# Patient Record
Sex: Female | Born: 1935
Health system: Southern US, Community
[De-identification: ages and names within clinical notes are randomized; demographics above are authoritative.]

## PROBLEM LIST (undated history)

## (undated) DIAGNOSIS — M1990 Unspecified osteoarthritis, unspecified site: Secondary | ICD-10-CM

## (undated) DIAGNOSIS — E559 Vitamin D deficiency, unspecified: Secondary | ICD-10-CM

## (undated) DIAGNOSIS — D649 Anemia, unspecified: Secondary | ICD-10-CM

## (undated) DIAGNOSIS — N200 Calculus of kidney: Secondary | ICD-10-CM

## (undated) DIAGNOSIS — J45909 Unspecified asthma, uncomplicated: Secondary | ICD-10-CM

## (undated) DIAGNOSIS — M810 Age-related osteoporosis without current pathological fracture: Secondary | ICD-10-CM

## (undated) DIAGNOSIS — K859 Acute pancreatitis without necrosis or infection, unspecified: Secondary | ICD-10-CM

## (undated) DIAGNOSIS — K219 Gastro-esophageal reflux disease without esophagitis: Secondary | ICD-10-CM

## (undated) DIAGNOSIS — I10 Essential (primary) hypertension: Secondary | ICD-10-CM

## (undated) HISTORY — DX: Unspecified osteoarthritis, unspecified site: M19.90

## (undated) HISTORY — PX: ABDOMINAL HYSTERECTOMY: SHX81

## (undated) HISTORY — DX: Acute pancreatitis without necrosis or infection, unspecified: K85.90

## (undated) HISTORY — DX: Vitamin D deficiency, unspecified: E55.9

## (undated) HISTORY — DX: Unspecified asthma, uncomplicated: J45.909

## (undated) HISTORY — DX: Gastro-esophageal reflux disease without esophagitis: K21.9

## (undated) HISTORY — DX: Essential (primary) hypertension: I10

## (undated) HISTORY — DX: Anemia, unspecified: D64.9

## (undated) HISTORY — DX: Calculus of kidney: N20.0

## (undated) HISTORY — PX: LITHOTRIPSY: SUR834

## (undated) HISTORY — PX: REPLACEMENT TOTAL KNEE: SUR1224

## (undated) HISTORY — DX: Age-related osteoporosis without current pathological fracture: M81.0

---

## 2004-12-08 ENCOUNTER — Ambulatory Visit: Payer: Self-pay | Admitting: Occupational Therapy

## 2005-01-25 ENCOUNTER — Ambulatory Visit: Payer: Self-pay | Admitting: Occupational Therapy

## 2005-06-22 ENCOUNTER — Ambulatory Visit: Payer: Self-pay | Admitting: Internal Medicine

## 2005-10-11 ENCOUNTER — Ambulatory Visit: Payer: Self-pay | Admitting: Surgery

## 2006-04-12 ENCOUNTER — Ambulatory Visit: Payer: Self-pay | Admitting: Surgery

## 2006-04-16 ENCOUNTER — Ambulatory Visit: Payer: Self-pay | Admitting: Internal Medicine

## 2006-06-05 ENCOUNTER — Ambulatory Visit: Payer: Self-pay | Admitting: Internal Medicine

## 2006-06-15 ENCOUNTER — Ambulatory Visit: Payer: Self-pay | Admitting: Internal Medicine

## 2006-07-27 ENCOUNTER — Emergency Department: Payer: Self-pay | Admitting: Emergency Medicine

## 2006-07-27 ENCOUNTER — Other Ambulatory Visit: Payer: Self-pay

## 2006-08-15 ENCOUNTER — Ambulatory Visit: Payer: Self-pay | Admitting: Internal Medicine

## 2007-01-17 ENCOUNTER — Other Ambulatory Visit: Payer: Self-pay

## 2007-01-17 ENCOUNTER — Inpatient Hospital Stay: Payer: Self-pay | Admitting: Vascular Surgery

## 2007-05-12 ENCOUNTER — Emergency Department: Payer: Self-pay | Admitting: Emergency Medicine

## 2007-10-15 ENCOUNTER — Ambulatory Visit: Payer: Self-pay | Admitting: Internal Medicine

## 2007-12-14 ENCOUNTER — Emergency Department: Payer: Self-pay | Admitting: Emergency Medicine

## 2007-12-14 ENCOUNTER — Other Ambulatory Visit: Payer: Self-pay

## 2008-05-09 ENCOUNTER — Other Ambulatory Visit: Payer: Self-pay

## 2008-05-10 ENCOUNTER — Inpatient Hospital Stay: Payer: Self-pay | Admitting: Surgery

## 2008-05-19 ENCOUNTER — Other Ambulatory Visit: Payer: Self-pay

## 2008-05-20 ENCOUNTER — Inpatient Hospital Stay: Payer: Self-pay | Admitting: Internal Medicine

## 2008-06-16 ENCOUNTER — Encounter: Payer: Self-pay | Admitting: Rheumatology

## 2008-09-25 ENCOUNTER — Ambulatory Visit: Payer: Self-pay | Admitting: Rheumatology

## 2008-11-12 ENCOUNTER — Ambulatory Visit: Payer: Self-pay | Admitting: Internal Medicine

## 2009-03-11 ENCOUNTER — Ambulatory Visit: Payer: Self-pay | Admitting: Internal Medicine

## 2010-02-17 ENCOUNTER — Ambulatory Visit: Payer: Self-pay | Admitting: Internal Medicine

## 2010-10-27 ENCOUNTER — Ambulatory Visit: Payer: Self-pay | Admitting: Otolaryngology

## 2010-10-27 ENCOUNTER — Ambulatory Visit: Payer: Self-pay

## 2011-01-12 ENCOUNTER — Ambulatory Visit: Payer: Self-pay | Admitting: Otolaryngology

## 2011-01-14 ENCOUNTER — Emergency Department: Payer: Self-pay | Admitting: Emergency Medicine

## 2011-01-21 ENCOUNTER — Emergency Department: Payer: Self-pay | Admitting: Emergency Medicine

## 2011-03-14 ENCOUNTER — Ambulatory Visit: Payer: Self-pay | Admitting: Internal Medicine

## 2011-05-05 ENCOUNTER — Observation Stay: Payer: Self-pay | Admitting: Internal Medicine

## 2011-07-19 ENCOUNTER — Ambulatory Visit: Payer: Self-pay | Admitting: Internal Medicine

## 2011-09-25 DIAGNOSIS — R05 Cough: Secondary | ICD-10-CM | POA: Diagnosis not present

## 2011-09-25 DIAGNOSIS — R0602 Shortness of breath: Secondary | ICD-10-CM | POA: Diagnosis not present

## 2011-10-04 ENCOUNTER — Ambulatory Visit: Payer: Self-pay | Admitting: Internal Medicine

## 2011-10-04 DIAGNOSIS — R1310 Dysphagia, unspecified: Secondary | ICD-10-CM | POA: Diagnosis not present

## 2011-11-06 DIAGNOSIS — R05 Cough: Secondary | ICD-10-CM | POA: Diagnosis not present

## 2011-11-06 DIAGNOSIS — R0602 Shortness of breath: Secondary | ICD-10-CM | POA: Diagnosis not present

## 2011-11-15 DIAGNOSIS — J209 Acute bronchitis, unspecified: Secondary | ICD-10-CM | POA: Diagnosis not present

## 2011-11-15 DIAGNOSIS — R05 Cough: Secondary | ICD-10-CM | POA: Diagnosis not present

## 2011-11-16 ENCOUNTER — Ambulatory Visit: Payer: Self-pay | Admitting: Internal Medicine

## 2011-11-16 DIAGNOSIS — R1310 Dysphagia, unspecified: Secondary | ICD-10-CM | POA: Diagnosis not present

## 2011-11-19 DIAGNOSIS — J209 Acute bronchitis, unspecified: Secondary | ICD-10-CM | POA: Diagnosis not present

## 2011-11-19 DIAGNOSIS — J019 Acute sinusitis, unspecified: Secondary | ICD-10-CM | POA: Diagnosis not present

## 2011-11-23 DIAGNOSIS — J069 Acute upper respiratory infection, unspecified: Secondary | ICD-10-CM | POA: Diagnosis not present

## 2011-11-23 DIAGNOSIS — R05 Cough: Secondary | ICD-10-CM | POA: Diagnosis not present

## 2011-11-23 DIAGNOSIS — R0989 Other specified symptoms and signs involving the circulatory and respiratory systems: Secondary | ICD-10-CM | POA: Diagnosis not present

## 2011-11-23 DIAGNOSIS — I10 Essential (primary) hypertension: Secondary | ICD-10-CM | POA: Diagnosis not present

## 2011-11-23 DIAGNOSIS — J40 Bronchitis, not specified as acute or chronic: Secondary | ICD-10-CM | POA: Diagnosis not present

## 2011-12-04 DIAGNOSIS — I369 Nonrheumatic tricuspid valve disorder, unspecified: Secondary | ICD-10-CM | POA: Diagnosis not present

## 2011-12-04 DIAGNOSIS — I059 Rheumatic mitral valve disease, unspecified: Secondary | ICD-10-CM | POA: Diagnosis not present

## 2011-12-04 DIAGNOSIS — I10 Essential (primary) hypertension: Secondary | ICD-10-CM | POA: Diagnosis not present

## 2011-12-06 DIAGNOSIS — M79609 Pain in unspecified limb: Secondary | ICD-10-CM | POA: Diagnosis not present

## 2011-12-06 DIAGNOSIS — I10 Essential (primary) hypertension: Secondary | ICD-10-CM | POA: Diagnosis not present

## 2011-12-06 DIAGNOSIS — D649 Anemia, unspecified: Secondary | ICD-10-CM | POA: Diagnosis not present

## 2012-01-05 DIAGNOSIS — M25519 Pain in unspecified shoulder: Secondary | ICD-10-CM | POA: Diagnosis not present

## 2012-01-05 DIAGNOSIS — M810 Age-related osteoporosis without current pathological fracture: Secondary | ICD-10-CM | POA: Diagnosis not present

## 2012-01-05 DIAGNOSIS — M159 Polyosteoarthritis, unspecified: Secondary | ICD-10-CM | POA: Diagnosis not present

## 2012-01-23 DIAGNOSIS — M810 Age-related osteoporosis without current pathological fracture: Secondary | ICD-10-CM | POA: Diagnosis not present

## 2012-01-23 DIAGNOSIS — I10 Essential (primary) hypertension: Secondary | ICD-10-CM | POA: Diagnosis not present

## 2012-01-29 DIAGNOSIS — R05 Cough: Secondary | ICD-10-CM | POA: Diagnosis not present

## 2012-01-29 DIAGNOSIS — R0602 Shortness of breath: Secondary | ICD-10-CM | POA: Diagnosis not present

## 2012-01-29 DIAGNOSIS — M810 Age-related osteoporosis without current pathological fracture: Secondary | ICD-10-CM | POA: Diagnosis not present

## 2012-01-29 DIAGNOSIS — M25519 Pain in unspecified shoulder: Secondary | ICD-10-CM | POA: Diagnosis not present

## 2012-03-13 DIAGNOSIS — M810 Age-related osteoporosis without current pathological fracture: Secondary | ICD-10-CM | POA: Diagnosis not present

## 2012-05-27 DIAGNOSIS — J209 Acute bronchitis, unspecified: Secondary | ICD-10-CM | POA: Diagnosis not present

## 2012-06-13 ENCOUNTER — Telehealth: Payer: Self-pay | Admitting: Internal Medicine

## 2012-06-13 NOTE — Telephone Encounter (Signed)
Patient's daughter advised as instructed via telephone. 

## 2012-06-13 NOTE — Telephone Encounter (Signed)
Best Call Back number is [phone] for the Daughter Liv

## 2012-06-13 NOTE — Telephone Encounter (Signed)
If not eating and weak, etc - needs eval before 10/17.  The acute care is open until 7:00 - see if daughter can take after work - then will follow up here

## 2012-06-13 NOTE — Telephone Encounter (Signed)
Pt's daughter is calling concerning her mother. She has a bad cold and is not eating at all, she is very weak and her daughter is very concerned about her. Pt;s daughter says she is off on October 14th and wanted to know if she could bring her in that day.

## 2012-06-20 DIAGNOSIS — Z23 Encounter for immunization: Secondary | ICD-10-CM | POA: Diagnosis not present

## 2012-07-01 DIAGNOSIS — I10 Essential (primary) hypertension: Secondary | ICD-10-CM | POA: Diagnosis not present

## 2012-07-01 DIAGNOSIS — I059 Rheumatic mitral valve disease, unspecified: Secondary | ICD-10-CM | POA: Diagnosis not present

## 2012-07-01 DIAGNOSIS — R0609 Other forms of dyspnea: Secondary | ICD-10-CM | POA: Diagnosis not present

## 2012-07-01 DIAGNOSIS — R0989 Other specified symptoms and signs involving the circulatory and respiratory systems: Secondary | ICD-10-CM | POA: Diagnosis not present

## 2012-07-04 ENCOUNTER — Telehealth: Payer: Self-pay | Admitting: Internal Medicine

## 2012-07-04 NOTE — Telephone Encounter (Signed)
If having dizziness and head hurting - would rec eval now to confirm nothing acute going on.  To acute care for eval - then i can follow up afterwards.

## 2012-07-04 NOTE — Telephone Encounter (Signed)
Lib called to see if ms Fregeau can be see sooner She stated ms Lucchesi is having light dizzy spells on and off and her head hurts for a year or more.  She has seen dr Lorin Picket about @ [facility]   And pt also needs order for mammogram  [place]

## 2012-07-04 NOTE — Telephone Encounter (Signed)
Called patient at home to inquired if she was still experiencing dizziness and head pain. Patient stated that she was not at the moment.

## 2012-07-29 DIAGNOSIS — J189 Pneumonia, unspecified organism: Secondary | ICD-10-CM | POA: Diagnosis not present

## 2012-07-29 DIAGNOSIS — R05 Cough: Secondary | ICD-10-CM | POA: Diagnosis not present

## 2012-07-30 DIAGNOSIS — D2370 Other benign neoplasm of skin of unspecified lower limb, including hip: Secondary | ICD-10-CM | POA: Diagnosis not present

## 2012-07-30 DIAGNOSIS — M79609 Pain in unspecified limb: Secondary | ICD-10-CM | POA: Diagnosis not present

## 2012-08-02 ENCOUNTER — Telehealth: Payer: Self-pay | Admitting: Internal Medicine

## 2012-08-02 NOTE — Telephone Encounter (Signed)
Amlodipine 5mg  90 tab Take 1 tablet daily [facility]

## 2012-08-02 NOTE — Telephone Encounter (Signed)
appt 08/16/13

## 2012-08-06 NOTE — Telephone Encounter (Signed)
Pt requesting amlodipine 5mg   New patient appointment 08/26/13 Please advise.

## 2012-08-06 NOTE — Telephone Encounter (Signed)
Ok to refill #90 with no refills.  

## 2012-08-07 ENCOUNTER — Other Ambulatory Visit: Payer: Self-pay | Admitting: *Deleted

## 2012-08-07 MED ORDER — AMLODIPINE BESYLATE 5 MG PO TABS: 5.0000 mg | ORAL_TABLET | Freq: Every day | ORAL | Status: DC

## 2012-08-07 NOTE — Telephone Encounter (Signed)
Refilled script

## 2012-08-16 ENCOUNTER — Encounter: Payer: Self-pay | Admitting: Internal Medicine

## 2012-08-16 ENCOUNTER — Ambulatory Visit (INDEPENDENT_AMBULATORY_CARE_PROVIDER_SITE_OTHER): Payer: Medicare Other | Admitting: Internal Medicine

## 2012-08-16 VITALS — BP 148/72 | HR 69 | Temp 97.9°F | Ht 60.0 in | Wt 111.8 lb

## 2012-08-16 DIAGNOSIS — M810 Age-related osteoporosis without current pathological fracture: Secondary | ICD-10-CM | POA: Insufficient documentation

## 2012-08-16 DIAGNOSIS — D649 Anemia, unspecified: Secondary | ICD-10-CM | POA: Insufficient documentation

## 2012-08-16 DIAGNOSIS — I10 Essential (primary) hypertension: Secondary | ICD-10-CM

## 2012-08-16 DIAGNOSIS — M1990 Unspecified osteoarthritis, unspecified site: Secondary | ICD-10-CM | POA: Insufficient documentation

## 2012-08-16 DIAGNOSIS — K219 Gastro-esophageal reflux disease without esophagitis: Secondary | ICD-10-CM

## 2012-08-16 DIAGNOSIS — M064 Inflammatory polyarthropathy: Secondary | ICD-10-CM | POA: Diagnosis not present

## 2012-08-16 LAB — CBC WITH DIFFERENTIAL/PLATELET
Basophils Absolute: 0 10*3/uL (ref 0.0–0.1)
Basophils Relative: 0.8 % (ref 0.0–3.0)
Eosinophils Absolute: 0 10*3/uL (ref 0.0–0.7)
Hemoglobin: 11.7 g/dL — ABNORMAL LOW (ref 12.0–15.0)
Lymphocytes Relative: 28.8 % (ref 12.0–46.0)
MCHC: 32.5 g/dL (ref 30.0–36.0)
MCV: 96.5 fl (ref 78.0–100.0)
Monocytes Absolute: 0.6 10*3/uL (ref 0.1–1.0)
Neutro Abs: 3.7 10*3/uL (ref 1.4–7.7)
Neutrophils Relative %: 61 % (ref 43.0–77.0)
RBC: 3.72 Mil/uL — ABNORMAL LOW (ref 3.87–5.11)
RDW: 12.7 % (ref 11.5–14.6)

## 2012-08-16 LAB — BASIC METABOLIC PANEL
BUN: 21 mg/dL (ref 6–23)
Chloride: 102 mEq/L (ref 96–112)
Glucose, Bld: 97 mg/dL (ref 70–99)
Potassium: 4.2 mEq/L (ref 3.5–5.1)
Sodium: 139 mEq/L (ref 135–145)

## 2012-08-16 NOTE — Assessment & Plan Note (Signed)
Saw GI.  Recommended if hgb stable - no further w/up.  Follow cbc.  

## 2012-08-16 NOTE — Patient Instructions (Addendum)
It was nice seeing you today.   I am glad you have been doing ok.  Let me know if you need anything.

## 2012-08-16 NOTE — Progress Notes (Signed)
  Subjective:    Patient ID: Heidi [name], female    DOB: 02-18-1918, 78 y.o.   MRN: [id number]  HPI 78 year old female with past history of nephrolithiasis, hypertension, GERD, osteoarthritis and osteoporosis who comes in today for a scheduled follow up.  States she is doing well.    Past Medical History  Diagnosis Date  . Anemia   . Hypertension   . Osteoporosis     vitamin D deficiency, nasal miacalcin, reclast, previous rib fracture  . Asthma   . GERD (gastroesophageal reflux disease)   . Nephrolithiasis   . OA (osteoarthritis)     Left knee replacement, hands, shoulders, cervial spine  . Vitamin D deficiency   . Inflammatory arthritis     elevated ESR, negative temporal artery bx, low titer rheumatoid factor  . Pancreatitis     s/p cholecystectomy with ERCP and stone extraction    Review of Systems Patient denies any headache, lightheadedness or dizziness.  No increased sinus or allergy symptoms.  No chest pain, tightness or palpitations.  No increased shortness of breath, cough or congestion.  No nausea or vomiting.  No acid reflux, dysphagia or odynophagia.  Acid controlled on Omeprazole.  No abdominal pain or cramping.  No bowel change, such as diarrhea, constipation, BRBPR or melana.  No urine change.        Objective:   Physical Exam Filed Vitals:   08/16/12 1034  BP: 148/72  Pulse: 69  Temp: 97.9 F (70.35 C)   78 year old female in no acute distress.   HEENT:  Nares - clear.  OP- without lesions or erythema.  NECK:  Supple, nontender.  No audible bruit.   HEART:  Appears to be regular. LUNGS:  Without crackles or wheezing audible.  Respirations even and unlabored.   RADIAL PULSE:  Equal bilaterally.  ABDOMEN:  Soft, nontender.  No audible abdominal bruit.   EXTREMITIES:  No increased edema to be present.  Stable.  Support hose in place.                   Assessment & Plan:  LOWER EXTREMITY EDEMA. Probably related to venostasis.  Continue support hose.   Follow.    HEALTH MAINTENANCE.  Obtain outside records for review.  Got her flu shot.

## 2012-08-16 NOTE — Assessment & Plan Note (Signed)
On Omeprazole.  Symptoms controlled.    

## 2012-08-16 NOTE — Assessment & Plan Note (Signed)
Received Reclast.  Continue calcium and vitamin D.  Follow.

## 2012-08-18 ENCOUNTER — Encounter: Payer: Self-pay | Admitting: Internal Medicine

## 2012-08-18 NOTE — Assessment & Plan Note (Signed)
Blood pressure as outlined.  States outside checks under control.  Same medication.  Check metabolic panel.

## 2012-08-18 NOTE — Assessment & Plan Note (Signed)
Stable.  Has seen Dr [facility].  Persistent shoulder pain and limited rom.  Desires no further intervention at this point.  Follow.    

## 2012-08-18 NOTE — Assessment & Plan Note (Signed)
Stable.  Follow.   

## 2012-08-19 ENCOUNTER — Encounter: Payer: Self-pay | Admitting: *Deleted

## 2012-08-20 DIAGNOSIS — D2370 Other benign neoplasm of skin of unspecified lower limb, including hip: Secondary | ICD-10-CM | POA: Diagnosis not present

## 2012-08-20 DIAGNOSIS — M79609 Pain in unspecified limb: Secondary | ICD-10-CM | POA: Diagnosis not present

## 2012-08-22 ENCOUNTER — Telehealth: Payer: Self-pay | Admitting: *Deleted

## 2012-08-22 NOTE — Telephone Encounter (Signed)
Message copied by Marlene Lard on Thu Aug 22, 2012  4:54 PM ------      Message from: Charm Barges      Created: Sat Aug 17, 2012  5:15 PM       Notify pt hemoglobin stable.  Ok.  Other labs ok.

## 2012-08-22 NOTE — Telephone Encounter (Signed)
Called patient with results.  

## 2012-09-03 ENCOUNTER — Encounter: Payer: Self-pay | Admitting: Internal Medicine

## 2012-10-01 DIAGNOSIS — H353190 Nonexudative age-related macular degeneration, unspecified eye, stage unspecified: Secondary | ICD-10-CM | POA: Diagnosis not present

## 2012-10-15 ENCOUNTER — Other Ambulatory Visit: Payer: Self-pay | Admitting: General Practice

## 2012-10-15 MED ORDER — OMEPRAZOLE 20 MG PO CPDR: 20.0000 mg | DELAYED_RELEASE_CAPSULE | Freq: Every day | ORAL | Status: DC

## 2012-11-01 ENCOUNTER — Emergency Department: Payer: Self-pay | Admitting: Emergency Medicine

## 2012-11-01 DIAGNOSIS — J310 Chronic rhinitis: Secondary | ICD-10-CM | POA: Diagnosis not present

## 2012-11-01 DIAGNOSIS — Z9089 Acquired absence of other organs: Secondary | ICD-10-CM | POA: Diagnosis not present

## 2012-11-01 DIAGNOSIS — R059 Cough, unspecified: Secondary | ICD-10-CM | POA: Diagnosis not present

## 2012-11-01 DIAGNOSIS — Z79899 Other long term (current) drug therapy: Secondary | ICD-10-CM | POA: Diagnosis not present

## 2012-11-01 DIAGNOSIS — R079 Chest pain, unspecified: Secondary | ICD-10-CM | POA: Diagnosis not present

## 2012-11-01 DIAGNOSIS — I10 Essential (primary) hypertension: Secondary | ICD-10-CM | POA: Diagnosis not present

## 2012-11-01 DIAGNOSIS — Z888 Allergy status to other drugs, medicaments and biological substances status: Secondary | ICD-10-CM | POA: Diagnosis not present

## 2012-11-01 DIAGNOSIS — Z9079 Acquired absence of other genital organ(s): Secondary | ICD-10-CM | POA: Diagnosis not present

## 2012-11-01 DIAGNOSIS — K219 Gastro-esophageal reflux disease without esophagitis: Secondary | ICD-10-CM | POA: Diagnosis not present

## 2012-11-01 DIAGNOSIS — Z882 Allergy status to sulfonamides status: Secondary | ICD-10-CM | POA: Diagnosis not present

## 2012-11-01 DIAGNOSIS — Z7982 Long term (current) use of aspirin: Secondary | ICD-10-CM | POA: Diagnosis not present

## 2012-11-08 ENCOUNTER — Ambulatory Visit (INDEPENDENT_AMBULATORY_CARE_PROVIDER_SITE_OTHER): Payer: Medicare Other | Admitting: Internal Medicine

## 2012-11-08 ENCOUNTER — Encounter: Payer: Self-pay | Admitting: Internal Medicine

## 2012-11-08 VITALS — BP 158/74 | HR 70 | Temp 97.9°F | Ht 60.0 in | Wt 114.0 lb

## 2012-11-08 DIAGNOSIS — D649 Anemia, unspecified: Secondary | ICD-10-CM

## 2012-11-08 DIAGNOSIS — K219 Gastro-esophageal reflux disease without esophagitis: Secondary | ICD-10-CM

## 2012-11-08 MED ORDER — FLUTICASONE PROPIONATE 50 MCG/ACT NA SUSP: 2.0000 | Freq: Every day | NASAL | Status: DC

## 2012-11-10 ENCOUNTER — Encounter: Payer: Self-pay | Admitting: Internal Medicine

## 2012-11-10 NOTE — Assessment & Plan Note (Signed)
Saw GI.  Recommended if hgb stable - no further w/up.  Follow cbc.  She is unclear if any labs were drawn in the ER.  Obtain lab results if drawn.

## 2012-11-10 NOTE — Assessment & Plan Note (Signed)
Blood pressure as outlined.  States outside checks under control.  Same medication.  Follow metabolic panel.

## 2012-11-10 NOTE — Progress Notes (Signed)
Subjective:    Patient ID: Heidi [name], female    DOB: 01-21-1918, 79 y.o.   MRN: [id number]  HPI 79 year old female with past history of nephrolithiasis, hypertension, GERD, osteoarthritis and osteoporosis who comes in today for an ER follow up.  She was seen 11/01/12 for persistent cough.  States she has had problems with persistent cough, but reports that on this day she had an episode of continuing cough and apparently it was hard to catch her breath.  Symptoms had resolved in the ER.  CXR did not reveal any acute abnormality.  She was discharged to follow up here.  She states she has had a problem with cough for years.  No real change per her report.  No increased sob or chest tightness.  Unsure of the medicines she is currently using/taking.  She has been worked up previously.  Has been treated for allergies and acid reflux.  Has seen pulmonary.  Nothing has seemed to make a difference (in the past) with the cough.  States she is eating and drinking well.  No nausea or vomiting.  No bowel change.   Past Medical History  Diagnosis Date  . Anemia   . Hypertension   . Osteoporosis     vitamin D deficiency, nasal miacalcin, reclast, previous rib fracture  . Asthma   . GERD (gastroesophageal reflux disease)   . Nephrolithiasis   . OA (osteoarthritis)     Left knee replacement, hands, shoulders, cervial spine  . Vitamin D deficiency   . Inflammatory arthritis     elevated ESR, negative temporal artery bx, low titer rheumatoid factor  . Pancreatitis     s/p cholecystectomy with ERCP and stone extraction    Review of Systems Patient denies any headache, lightheadedness or dizziness.  No increased sinus or allergy symptoms. Some minimal drainage.  No chest pain, tightness or palpitations.  No increased shortness of breath.  Does report some increased cough.  See above.  No nausea or vomiting.  No acid reflux, dysphagia or odynophagia.  Acid controlled on Omeprazole.  No abdominal pain  or cramping.  No bowel change, such as diarrhea, constipation, BRBPR or melana.  No urine change.        Objective:   Physical Exam  Filed Vitals:   11/08/12 1347  BP: 158/74  Pulse: 70  Temp: 97.9 F (36.6 C)   Blood pressure recheck:  148-150/78, pulse 21  79 year old female in no acute distress.   HEENT:  Nares - clear.  OP- without lesions or erythema.  TMs visualized - without erythema.  NECK:  Supple, nontender.  No audible bruit.   HEART:  Appears to be regular. LUNGS:  Without crackles or wheezing audible.  Respirations even and unlabored.  No increased cough with forced expiration.  RADIAL PULSE:  Equal bilaterally.  ABDOMEN:  Soft, nontender.  No audible abdominal bruit.   EXTREMITIES:  No increased edema to be present.  Stable.   SKIN:  No rash.                  Assessment & Plan:  COUGH.  Probably some allergy triggers.  She is unclear of the exact medications she is currently taking.  She feels her symptoms are stable.  Will start her on Flonase.  She will call with her current medications.  Adjust accordingly.  Was previously on singulair.  Confirm if using any inhalers.  Robitussin DM for increased cough.  Continue omeprazole for  continued control of acid reflux.     HEALTH MAINTENANCE.  Obtain outside records for review.  Got her flu shot.

## 2012-11-10 NOTE — Assessment & Plan Note (Signed)
Stable.  Has seen Dr Gavin Potters.  Persistent shoulder pain and limited rom.  Desires no further intervention at this point.  Follow.

## 2012-11-10 NOTE — Assessment & Plan Note (Signed)
On Omeprazole.  Symptoms controlled.    

## 2012-11-10 NOTE — Assessment & Plan Note (Signed)
Stable.  Follow.   

## 2012-11-28 ENCOUNTER — Telehealth: Payer: Self-pay | Admitting: Internal Medicine

## 2012-11-28 NOTE — Telephone Encounter (Signed)
[phone] cell Lanora Manis daughter.  Asking if Dr. Lorin Picket can call in an Rx to [facility] for prednisone for her shoulders.  Please advise.

## 2012-11-28 NOTE — Telephone Encounter (Signed)
Patient will need referral to see Lavenia Atlas. Per Dr. Lorin Picket. If patient is agreeable to referral. Patient may need inj and will need to get prednisone for wally.

## 2012-11-28 NOTE — Telephone Encounter (Signed)
Called patient's daughter to get more info about her mother's sx.

## 2012-11-29 NOTE — Telephone Encounter (Signed)
Pt daughter returned call.  States pt is already a pt of Dr. Lavenia Atlas and she is going to go ahead and call and make an appointment for her mother with Dr. Gavin Potters.

## 2012-12-02 NOTE — Telephone Encounter (Signed)
Just an FYI

## 2012-12-12 DIAGNOSIS — B351 Tinea unguium: Secondary | ICD-10-CM | POA: Diagnosis not present

## 2012-12-12 DIAGNOSIS — M79609 Pain in unspecified limb: Secondary | ICD-10-CM | POA: Diagnosis not present

## 2012-12-12 DIAGNOSIS — M7750 Other enthesopathy of unspecified foot: Secondary | ICD-10-CM | POA: Diagnosis not present

## 2012-12-12 DIAGNOSIS — D2370 Other benign neoplasm of skin of unspecified lower limb, including hip: Secondary | ICD-10-CM | POA: Diagnosis not present

## 2012-12-16 ENCOUNTER — Encounter: Payer: Medicare Other | Admitting: Internal Medicine

## 2012-12-19 DIAGNOSIS — M25519 Pain in unspecified shoulder: Secondary | ICD-10-CM | POA: Diagnosis not present

## 2012-12-19 DIAGNOSIS — M19019 Primary osteoarthritis, unspecified shoulder: Secondary | ICD-10-CM | POA: Diagnosis not present

## 2012-12-26 DIAGNOSIS — M79609 Pain in unspecified limb: Secondary | ICD-10-CM | POA: Diagnosis not present

## 2012-12-26 DIAGNOSIS — D2370 Other benign neoplasm of skin of unspecified lower limb, including hip: Secondary | ICD-10-CM | POA: Diagnosis not present

## 2012-12-30 ENCOUNTER — Encounter: Payer: Medicare Other | Admitting: Internal Medicine

## 2013-01-07 ENCOUNTER — Ambulatory Visit (INDEPENDENT_AMBULATORY_CARE_PROVIDER_SITE_OTHER): Payer: Medicare Other | Admitting: Internal Medicine

## 2013-01-07 VITALS — BP 130/80 | HR 73 | Temp 97.7°F | Ht 60.0 in | Wt 114.5 lb

## 2013-01-07 DIAGNOSIS — Z1239 Encounter for other screening for malignant neoplasm of breast: Secondary | ICD-10-CM | POA: Diagnosis not present

## 2013-01-07 DIAGNOSIS — K219 Gastro-esophageal reflux disease without esophagitis: Secondary | ICD-10-CM

## 2013-01-07 DIAGNOSIS — M810 Age-related osteoporosis without current pathological fracture: Secondary | ICD-10-CM | POA: Diagnosis not present

## 2013-01-07 DIAGNOSIS — D649 Anemia, unspecified: Secondary | ICD-10-CM | POA: Diagnosis not present

## 2013-01-07 DIAGNOSIS — M1990 Unspecified osteoarthritis, unspecified site: Secondary | ICD-10-CM

## 2013-01-07 DIAGNOSIS — I10 Essential (primary) hypertension: Secondary | ICD-10-CM | POA: Diagnosis not present

## 2013-01-07 DIAGNOSIS — M1380 Other specified arthritis, unspecified site: Secondary | ICD-10-CM

## 2013-01-07 DIAGNOSIS — M064 Inflammatory polyarthropathy: Secondary | ICD-10-CM

## 2013-01-08 ENCOUNTER — Encounter: Payer: Self-pay | Admitting: *Deleted

## 2013-01-08 LAB — COMPREHENSIVE METABOLIC PANEL
AST: 17 U/L (ref 0–37)
Alkaline Phosphatase: 37 U/L — ABNORMAL LOW (ref 39–117)
BUN: 18 mg/dL (ref 6–23)
Glucose, Bld: 88 mg/dL (ref 70–99)
Total Bilirubin: 0.8 mg/dL (ref 0.3–1.2)

## 2013-01-08 LAB — CBC WITH DIFFERENTIAL/PLATELET
Basophils Absolute: 0.1 10*3/uL (ref 0.0–0.1)
Basophils Relative: 1.4 % (ref 0.0–3.0)
Eosinophils Absolute: 0 10*3/uL (ref 0.0–0.7)
Hemoglobin: 12.4 g/dL (ref 12.0–15.0)
Lymphocytes Relative: 24.3 % (ref 12.0–46.0)
MCHC: 33.8 g/dL (ref 30.0–36.0)
Monocytes Relative: 10.1 % (ref 3.0–12.0)
Neutro Abs: 4.4 10*3/uL (ref 1.4–7.7)
Neutrophils Relative %: 63.8 % (ref 43.0–77.0)
RDW: 12.6 % (ref 11.5–14.6)
WBC: 6.9 10*3/uL (ref 4.5–10.5)

## 2013-01-08 LAB — TSH: TSH: 2.44 u[IU]/mL (ref 0.35–5.50)

## 2013-01-09 DIAGNOSIS — M19019 Primary osteoarthritis, unspecified shoulder: Secondary | ICD-10-CM | POA: Diagnosis not present

## 2013-01-12 ENCOUNTER — Encounter: Payer: Self-pay | Admitting: Internal Medicine

## 2013-01-12 NOTE — Assessment & Plan Note (Signed)
Blood pressure as outlined.  Same medication.  Follow metabolic panel.   

## 2013-01-12 NOTE — Assessment & Plan Note (Signed)
Received Reclast.  Continue calcium and vitamin D.  Follow.

## 2013-01-12 NOTE — Assessment & Plan Note (Signed)
On Omeprazole.  Symptoms controlled.    

## 2013-01-12 NOTE — Assessment & Plan Note (Signed)
Stable.  Has seen [facility].  Persistent shoulder pain and limited rom.  Seeing Dr [facility].   

## 2013-01-12 NOTE — Assessment & Plan Note (Signed)
Stable.  Follow. Shoulder pain and limited rom as outlined.  Continues to follow up with Dr Gavin Potters.

## 2013-01-12 NOTE — Assessment & Plan Note (Signed)
Saw GI.  Recommended if hgb stable - no further w/up.  Follow cbc.  

## 2013-01-12 NOTE — Progress Notes (Signed)
Subjective:    Patient ID: Heidi [name], female    DOB: 04/11/1918, 79 y.o.   MRN: [id number]  HPI 79 year old female with past history of nephrolithiasis, hypertension, GERD, osteoarthritis and osteoporosis who comes in today to follow up on these issues as well as for a complete physical exam.  She is accompanied by her daughter.  History obtained from both of them.  States she is doing relatively well.  Her main complaint is that of shoulder pain.  Is seeing Dr Gavin Potters.  Had an injection.  Did not make a big difference.  Has very limited rom.  It is hard for her to feed herself and fix her hair and get dressed.  She lives with her daughter.  We discussed my concern regarding her driving.   States she is eating and drinking well.  No nausea or vomiting.  No bowel change.  Breathing stable.    Past Medical History  Diagnosis Date  . Anemia   . Hypertension   . Osteoporosis     vitamin D deficiency, nasal miacalcin, reclast, previous rib fracture  . Asthma   . GERD (gastroesophageal reflux disease)   . Nephrolithiasis   . OA (osteoarthritis)     Left knee replacement, hands, shoulders, cervial spine  . Vitamin D deficiency   . Inflammatory arthritis     elevated ESR, negative temporal artery bx, low titer rheumatoid factor  . Pancreatitis     s/p cholecystectomy with ERCP and stone extraction    Current Outpatient Prescriptions on File Prior to Visit  Medication Sig Dispense Refill  . acetaminophen (TYLENOL) 650 MG CR tablet Take 650 mg by mouth every 8 (eight) hours as needed.      Marland Kitchen amLODipine (NORVASC) 5 MG tablet Take 1 tablet (5 mg total) by mouth daily.  90 tablet  3  . Calcium Carbonate-Vitamin D (CALCIUM 600+D) 600-200 MG-UNIT TABS Take 1 tablet by mouth 2 (two) times daily.      . Cholecalciferol (VITAMIN D) 2000 UNITS tablet Take 2,000 Units by mouth daily.      . montelukast (SINGULAIR) 10 MG tablet Take 10 mg by mouth daily.      Marland Kitchen omeprazole (PRILOSEC) 20 MG  capsule Take 1 capsule (20 mg total) by mouth daily.  90 capsule  0  . thiamine (VITAMIN B-1) 100 MG tablet Take 100 mg by mouth daily.      . vitamin C (ASCORBIC ACID) 500 MG tablet Take 500 mg by mouth daily.      . vitamin E 400 UNIT capsule Take 400 Units by mouth daily.      . fluticasone (FLONASE) 50 MCG/ACT nasal spray Place 2 sprays into the nose daily.  16 g  3   No current facility-administered medications on file prior to visit.    Review of Systems Patient denies any headache, lightheadedness or dizziness.  No increased sinus or allergy symptoms. Some minimal drainage.  No chest pain, tightness or palpitations.  No increased shortness of breath.  Breathing stable.  No nausea or vomiting.  No acid reflux, dysphagia or odynophagia.  Acid controlled on Omeprazole.  No abdominal pain or cramping.  No bowel change, such as diarrhea, constipation, BRBPR or melana.  No urine change.  Increased shoulder pain and limited rom.        Objective:   Physical Exam  Filed Vitals:   01/07/13 1447  BP: 130/80  Pulse: 73  Temp: 97.7 F (36.5 C)  Blood pressure recheck:  134/72, pulse 47  79 year old female in no acute distress.   HEENT:  Nares- clear.  Oropharynx - without lesions. NECK:  Supple.  Nontender.  No audible bruit.  HEART:  Appears to be regular. LUNGS:  No crackles or wheezing audible.  Respirations even and unlabored.  RADIAL PULSE:  Equal bilaterally.    BREASTS:  No nipple discharge or nipple retraction present.  Could not appreciate any distinct nodules or axillary adenopathy.  ABDOMEN:  Soft, nontender.  Bowel sounds present and normal.  No audible abdominal bruit.  GU:  Declined.    RECTAL:  Not performed.     EXTREMITIES:  No increased edema present.  DP pulses palpable and equal bilaterally.      MSK:  Shoulder pain and limited rom with attempts at raising her arm - especially approaching 90 degrees.         Assessment & Plan:  COUGH.  Not reported as a  significant issue today.  Continue omeprazole for acid reflux.  Treat allergies.    MSK.  Increased pain and limited rom in her shoulders.  Seeing Dr Gavin Potters.  Discussed my concern regarding her driving.  She only drives short distances.  Discussed with her and her daughter.  Will make arrangements for someone to take her to the places she needs to go.      HEALTH MAINTENANCE.  Physical today.  Schedule mammogram.

## 2013-01-16 ENCOUNTER — Other Ambulatory Visit: Payer: Self-pay | Admitting: *Deleted

## 2013-01-16 MED ORDER — METOPROLOL SUCCINATE ER 25 MG PO TB24: 12.5000 mg | ORAL_TABLET | Freq: Every day | ORAL | Status: DC

## 2013-01-16 MED ORDER — OMEPRAZOLE 20 MG PO CPDR: 20.0000 mg | DELAYED_RELEASE_CAPSULE | Freq: Every day | ORAL | Status: DC

## 2013-01-16 MED ORDER — MONTELUKAST SODIUM 10 MG PO TABS: 10.0000 mg | ORAL_TABLET | Freq: Every day | ORAL | Status: DC

## 2013-01-16 NOTE — Telephone Encounter (Signed)
Refill Request  Metoprolol ER 25 mg  #30  Take one half tablet daily

## 2013-02-14 ENCOUNTER — Telehealth: Payer: Self-pay | Admitting: Internal Medicine

## 2013-02-14 NOTE — Telephone Encounter (Signed)
Patient Information:  Caller Name: Lib  Phone: [phone]  Patient: Heidi [name], Heidi [name]  Gender: Female  DOB: [birth year]  Age: 79 Years  PCP: Dale [place]  Office Follow Up:  Does the office need to follow up with this patient?: Yes  Instructions For The Office: Daughter requests to speak with Dr Lorin Picket to confirm if needs to be seen.  RN Note:  Daily PM episodes of dizziness and headache.  Staggered gait during dizziness episode. Wants to sit still.  Episodes start about 1700 and last until bed. Awakens asymptomatic. Unaware of how much fluid [place]ah drinks during day.  Eating more sweets than before.  Caller not with patient at time of call; was with her earlier this morning. Declined RN recommendation to make an appointment based on caller age and ongoing symptoms.  Requests to talk with Dr Lorin Picket before scheduling.  Please call back.  Symptoms  Reason For Call & Symptoms: Episodes of moderate to severe dizziness and headaches daily in the afternoon for past 2 weeks.  Reports been eating  more sweets than normal in past year. Fluid intake unknown.  Reviewed Health History In EMR: Yes  Reviewed Medications In EMR: Yes  Reviewed Allergies In EMR: Yes  Reviewed Surgeries / Procedures: Yes  Date of Onset of Symptoms: 01/31/2013  Treatments Tried: sits still to reduce dizziness  Treatments Tried Worked: Yes  Guideline(s) Used:  Dizziness  Disposition Per Guideline:   Discuss with PCP and Callback by Nurse Today  Reason For Disposition Reached:   Taking a medicine that could cause dizziness (e.g., blood pressure medications, diuretics)  Advice Given:  Some Causes of Temporary Dizziness:  Poor Fluid Intake - Not drinking enough fluids and being a little dehydrated is a common cause of temporary dizziness. This is always worse during hot weather.  Standing Up Suddenly - Standing up suddenly (especially getting out of bed) or prolonged standing in one place are common causes of  temporary dizziness. Not drinking enough fluids always makes it worse. Certain medications can cause or increase this type of dizziness (e.g., blood pressure medications).  Drink Fluids:  Drink several glasses of fruit juice, other clear fluids, or water. This will improve hydration and blood glucose. If you have a fever or have had heat exposure, make sure the fluids are cold.  Rest for 1-2 Hours:  Lie down with feet elevated for 1 hour. This will improve blood flow and increase blood flow to the brain.  Stand Up Slowly:  In the mornings, sit up for a few minutes before you stand up. That will help your blood flow make the adjustment.  Sit down or lie down if you feel dizzy.  Call Back If:  Passes out (faints)  You become worse.  RN Overrode Recommendation:  Make Appointment  Refused to schedule appointment until speaks with Dr Lorin Picket.

## 2013-02-14 NOTE — Telephone Encounter (Signed)
Left message with daughter Lanora Manis), asking pt to call back on Monday to schedule an appt for Tue 6/10 @ 11:30 with Dr. Lorin Picket (See prior note).

## 2013-02-14 NOTE — Telephone Encounter (Signed)
She does need an appt to be seen.  I can see her on 02/18/13 at 11:30.  Block the 11:00 slot.  Will be worked in for this.  If any acute sx prior to that appt - needs eval either er or acute care pending sx.

## 2013-02-14 NOTE — Telephone Encounter (Signed)
Please advise 

## 2013-02-17 ENCOUNTER — Encounter: Payer: Self-pay | Admitting: Internal Medicine

## 2013-02-18 ENCOUNTER — Other Ambulatory Visit: Payer: Self-pay | Admitting: *Deleted

## 2013-02-18 ENCOUNTER — Other Ambulatory Visit (INDEPENDENT_AMBULATORY_CARE_PROVIDER_SITE_OTHER): Payer: Medicare Other

## 2013-02-18 ENCOUNTER — Ambulatory Visit (INDEPENDENT_AMBULATORY_CARE_PROVIDER_SITE_OTHER): Payer: Medicare Other | Admitting: Internal Medicine

## 2013-02-18 ENCOUNTER — Encounter: Payer: Self-pay | Admitting: Internal Medicine

## 2013-02-18 VITALS — BP 120/70 | HR 67 | Temp 98.2°F | Ht 60.0 in | Wt 113.0 lb

## 2013-02-18 DIAGNOSIS — R51 Headache: Secondary | ICD-10-CM

## 2013-02-18 DIAGNOSIS — M1990 Unspecified osteoarthritis, unspecified site: Secondary | ICD-10-CM

## 2013-02-18 DIAGNOSIS — I10 Essential (primary) hypertension: Secondary | ICD-10-CM | POA: Diagnosis not present

## 2013-02-18 DIAGNOSIS — M064 Inflammatory polyarthropathy: Secondary | ICD-10-CM

## 2013-02-18 DIAGNOSIS — D649 Anemia, unspecified: Secondary | ICD-10-CM | POA: Diagnosis not present

## 2013-02-18 DIAGNOSIS — R42 Dizziness and giddiness: Secondary | ICD-10-CM

## 2013-02-18 DIAGNOSIS — K219 Gastro-esophageal reflux disease without esophagitis: Secondary | ICD-10-CM

## 2013-02-18 LAB — CBC WITH DIFFERENTIAL/PLATELET
Basophils Relative: 0.4 % (ref 0.0–3.0)
Eosinophils Absolute: 0 10*3/uL (ref 0.0–0.7)
HCT: 37.8 % (ref 36.0–46.0)
Hemoglobin: 12.4 g/dL (ref 12.0–15.0)
Lymphocytes Relative: 23.7 % (ref 12.0–46.0)
MCHC: 32.8 g/dL (ref 30.0–36.0)
MCV: 97.3 fl (ref 78.0–100.0)
Monocytes Absolute: 0.5 10*3/uL (ref 0.1–1.0)
Neutro Abs: 4.5 10*3/uL (ref 1.4–7.7)
RBC: 3.88 Mil/uL (ref 3.87–5.11)

## 2013-02-18 LAB — SEDIMENTATION RATE: Sed Rate: 18 mm/hr (ref 0–22)

## 2013-02-18 LAB — COMPREHENSIVE METABOLIC PANEL
AST: 20 U/L (ref 0–37)
BUN: 16 mg/dL (ref 6–23)
Calcium: 9.8 mg/dL (ref 8.4–10.5)
Chloride: 106 mEq/L (ref 96–112)
Creatinine, Ser: 0.9 mg/dL (ref 0.4–1.2)

## 2013-02-19 ENCOUNTER — Encounter: Payer: Self-pay | Admitting: *Deleted

## 2013-02-19 ENCOUNTER — Encounter: Payer: Self-pay | Admitting: Internal Medicine

## 2013-02-19 ENCOUNTER — Telehealth: Payer: Self-pay | Admitting: *Deleted

## 2013-02-19 DIAGNOSIS — R519 Headache, unspecified: Secondary | ICD-10-CM | POA: Insufficient documentation

## 2013-02-19 DIAGNOSIS — R42 Dizziness and giddiness: Secondary | ICD-10-CM | POA: Insufficient documentation

## 2013-02-19 NOTE — Assessment & Plan Note (Signed)
Blood pressure as outlined.  Same medication.  Follow metabolic panel.   

## 2013-02-19 NOTE — Assessment & Plan Note (Signed)
Persistent intermittent dizziness.  Blood pressure does decrease some from lying to standing.  Stressed to her the importance of staying hydrated.  Headache as outlined.  Refer to neurology for evaluation.

## 2013-02-19 NOTE — Assessment & Plan Note (Signed)
Stable.  Follow. Shoulder pain and limited rom as outlined.  Continues to follow up with Dr [facility].      

## 2013-02-19 NOTE — Progress Notes (Signed)
Subjective:    Patient ID: Heidi [name], female    DOB: 12/15/1917, 79 y.o.   MRN: [id number]  Headache   79 year old female with past history of nephrolithiasis, hypertension, GERD, osteoarthritis and osteoporosis who comes in today as a work in to discuss persistent headaches and dizziness.  She is accompanied by her daughter.  History obtained from both of them.  She has been having daily headaches.  Worse in the afternoon.  Taking tylenol.  Appears to help some.  Localized more to her forehead. No sinus congestion.  Some allergy symptoms. She is also having intermittent light headedness.  Worse when she is hot. No chest pain or tightness. Still having some shoulder pain.  Is seeing Dr Gavin Potters.  Had an injection.  Did not make a big difference.  Has very limited rom.   States she is eating well.  No nausea or vomiting.  No bowel change.  Breathing stable.     Past Medical History  Diagnosis Date  . Anemia   . Hypertension   . Osteoporosis     vitamin D deficiency, nasal miacalcin, reclast, previous rib fracture  . Asthma   . GERD (gastroesophageal reflux disease)   . Nephrolithiasis   . OA (osteoarthritis)     Left knee replacement, hands, shoulders, cervial spine  . Vitamin D deficiency   . Inflammatory arthritis     elevated ESR, negative temporal artery bx, low titer rheumatoid factor  . Pancreatitis     s/p cholecystectomy with ERCP and stone extraction    Current Outpatient Prescriptions on File Prior to Visit  Medication Sig Dispense Refill  . acetaminophen (TYLENOL) 650 MG CR tablet Take 650 mg by mouth every 8 (eight) hours as needed.      Marland Kitchen amLODipine (NORVASC) 5 MG tablet Take 1 tablet (5 mg total) by mouth daily.  90 tablet  3  . Calcium Carbonate-Vitamin D (CALCIUM 600+D) 600-200 MG-UNIT TABS Take 1 tablet by mouth 2 (two) times daily.      . Cholecalciferol (VITAMIN D) 2000 UNITS tablet Take 2,000 Units by mouth daily.      . fluticasone (FLONASE) 50 MCG/ACT  nasal spray Place 2 sprays into the nose daily.  16 g  3  . metoprolol succinate (TOPROL-XL) 25 MG 24 hr tablet Take 0.5 tablets (12.5 mg total) by mouth daily.  30 tablet  5  . montelukast (SINGULAIR) 10 MG tablet Take 1 tablet (10 mg total) by mouth daily.  30 tablet  5  . omeprazole (PRILOSEC) 20 MG capsule Take 1 capsule (20 mg total) by mouth daily.  90 capsule  1  . thiamine (VITAMIN B-1) 100 MG tablet Take 100 mg by mouth daily.      . vitamin C (ASCORBIC ACID) 500 MG tablet Take 500 mg by mouth daily.      . vitamin E 400 UNIT capsule Take 400 Units by mouth daily.       No current facility-administered medications on file prior to visit.    Review of Systems  Neurological: Positive for headaches.  headache and dizziness as outlined.  Persistent.  No known triggers.   No increased sinus or allergy symptoms.  Some minimal drainage.  No chest pain, tightness or palpitations.  No increased shortness of breath.  Breathing stable.  No nausea or vomiting.  No acid reflux, dysphagia or odynophagia.  Acid controlled on Omeprazole.  No abdominal pain or cramping.  No bowel change, such as diarrhea, constipation,  BRBPR or melana.  No urine change.  Increased shoulder pain and limited rom.  She reports a chair fell on her left foot.  Has increased bruising and swelling.  No significant pain when she walks.       Objective:   Physical Exam  Filed Vitals:   02/18/13 1208  BP: 120/70  Pulse: 67  Temp: 98.2 F (36.8 C)   Blood pressure recheck:  138/78 lying, pulse 72 - 158/1 lying  79 year old female in no acute distress.   HEENT:  Nares- clear.  Oropharynx - without lesions. NECK:  Supple.  Nontender.  No audible bruit.  HEART:  Appears to be regular. LUNGS:  No crackles or wheezing audible.  Respirations even and unlabored.  RADIAL PULSE:  Equal bilaterally.   ABDOMEN:  Soft, nontender.  Bowel sounds present and normal.  No audible abdominal bruit.     EXTREMITIES:  No increased edema  present.  DP pulses palpable and equal bilaterally.      MSK:  Shoulder pain and limited rom with attempts at raising her arm - especially approaching 90 degrees.  Increased soft tissue swelling left foot.  Increased bruising.  Increased pain to palpation over the 4th-5th metatarsa.         Assessment & Plan:  COUGH.  Not reported as a significant issue today.  Continue omeprazole for acid reflux.  Treat allergies.    MSK.  Increased pain and limited rom in her shoulders.  Seeing Dr Gavin Potters.  Increased pain, bruising and swelling in her left foot.  Possible fracture.  Discussed with her regarding further evaluation - including xray.  She declines.  Declines brace or post op shoe.  Follow.      HEALTH MAINTENANCE.  Physical last visit.    I spent more than 40 minutes with the patient and her daughter and more than 50% of the time was spent in consultation regarding the above.

## 2013-02-19 NOTE — Assessment & Plan Note (Signed)
Stable.  Has seen [facility].  Persistent shoulder pain and limited rom.  Seeing Dr [facility].   

## 2013-02-19 NOTE — Assessment & Plan Note (Signed)
Saw GI.  Recommended if hgb stable - no further w/up.  Follow cbc.  

## 2013-02-19 NOTE — Assessment & Plan Note (Signed)
Persistent intermittent headache.  Described as dull headache - across her forehead.  No significant sinus congestion.  Some drainage.  Takes tylenol arthritis.  Worse in the afternoon.  Taking tylenol arthritis.  Does help some.  Discussed with her and her daughter at length today.  Discussed further w/up.  Will recheck ESR.  Discussed MRI.  She prefers to hold on MRI at this time.  Refer to neurology for evaluation of the headache and dizziness.  tylenoll as directed.  Unable to take antiinflammatories or ultram.  Want to avoid narcotics.

## 2013-02-19 NOTE — Assessment & Plan Note (Signed)
On Omeprazole.  Symptoms controlled.    

## 2013-02-19 NOTE — Telephone Encounter (Signed)
Patient called stating mother confused as to phone call she received on 02/18/13, she thought it could be in reference to a neurology appointment, please advise daughter due to mother is 13 and does not hear well. Patient stated you can leave message on her cell or home phone.

## 2013-03-11 DIAGNOSIS — I10 Essential (primary) hypertension: Secondary | ICD-10-CM | POA: Diagnosis not present

## 2013-03-11 DIAGNOSIS — R51 Headache: Secondary | ICD-10-CM | POA: Diagnosis not present

## 2013-03-15 ENCOUNTER — Emergency Department: Payer: Self-pay | Admitting: Emergency Medicine

## 2013-03-15 DIAGNOSIS — S51809A Unspecified open wound of unspecified forearm, initial encounter: Secondary | ICD-10-CM | POA: Diagnosis not present

## 2013-03-15 DIAGNOSIS — Z880 Allergy status to penicillin: Secondary | ICD-10-CM | POA: Diagnosis not present

## 2013-03-15 DIAGNOSIS — I10 Essential (primary) hypertension: Secondary | ICD-10-CM | POA: Diagnosis not present

## 2013-05-15 DIAGNOSIS — M79609 Pain in unspecified limb: Secondary | ICD-10-CM | POA: Diagnosis not present

## 2013-05-15 DIAGNOSIS — D2370 Other benign neoplasm of skin of unspecified lower limb, including hip: Secondary | ICD-10-CM | POA: Diagnosis not present

## 2013-05-26 DIAGNOSIS — T783XXA Angioneurotic edema, initial encounter: Secondary | ICD-10-CM | POA: Diagnosis not present

## 2013-05-26 DIAGNOSIS — I10 Essential (primary) hypertension: Secondary | ICD-10-CM | POA: Diagnosis not present

## 2013-05-26 DIAGNOSIS — T7840XA Allergy, unspecified, initial encounter: Secondary | ICD-10-CM | POA: Diagnosis not present

## 2013-06-10 DIAGNOSIS — R51 Headache: Secondary | ICD-10-CM | POA: Diagnosis not present

## 2013-06-10 DIAGNOSIS — I10 Essential (primary) hypertension: Secondary | ICD-10-CM | POA: Diagnosis not present

## 2013-06-10 DIAGNOSIS — R42 Dizziness and giddiness: Secondary | ICD-10-CM | POA: Diagnosis not present

## 2013-06-21 DIAGNOSIS — Z23 Encounter for immunization: Secondary | ICD-10-CM | POA: Diagnosis not present

## 2013-07-08 ENCOUNTER — Encounter (INDEPENDENT_AMBULATORY_CARE_PROVIDER_SITE_OTHER): Payer: Self-pay

## 2013-07-08 ENCOUNTER — Ambulatory Visit (INDEPENDENT_AMBULATORY_CARE_PROVIDER_SITE_OTHER): Payer: Medicare Other | Admitting: Internal Medicine

## 2013-07-08 ENCOUNTER — Encounter: Payer: Self-pay | Admitting: Internal Medicine

## 2013-07-08 ENCOUNTER — Telehealth: Payer: Self-pay | Admitting: Internal Medicine

## 2013-07-08 VITALS — BP 110/70 | HR 60 | Temp 97.9°F | Ht 60.0 in | Wt 111.5 lb

## 2013-07-08 DIAGNOSIS — R42 Dizziness and giddiness: Secondary | ICD-10-CM

## 2013-07-08 DIAGNOSIS — N390 Urinary tract infection, site not specified: Secondary | ICD-10-CM

## 2013-07-08 DIAGNOSIS — M25519 Pain in unspecified shoulder: Secondary | ICD-10-CM

## 2013-07-08 DIAGNOSIS — M25512 Pain in left shoulder: Secondary | ICD-10-CM

## 2013-07-08 DIAGNOSIS — M810 Age-related osteoporosis without current pathological fracture: Secondary | ICD-10-CM

## 2013-07-08 DIAGNOSIS — I10 Essential (primary) hypertension: Secondary | ICD-10-CM

## 2013-07-08 DIAGNOSIS — R51 Headache: Secondary | ICD-10-CM

## 2013-07-08 DIAGNOSIS — D649 Anemia, unspecified: Secondary | ICD-10-CM | POA: Diagnosis not present

## 2013-07-08 DIAGNOSIS — M1990 Unspecified osteoarthritis, unspecified site: Secondary | ICD-10-CM

## 2013-07-08 DIAGNOSIS — K219 Gastro-esophageal reflux disease without esophagitis: Secondary | ICD-10-CM

## 2013-07-08 DIAGNOSIS — M064 Inflammatory polyarthropathy: Secondary | ICD-10-CM

## 2013-07-08 LAB — FERRITIN: Ferritin: 25.5 ng/mL (ref 10.0–291.0)

## 2013-07-08 LAB — BASIC METABOLIC PANEL
Calcium: 10.2 mg/dL (ref 8.4–10.5)
GFR: 71.12 mL/min (ref >60.00)
Glucose, Bld: 93 mg/dL (ref 70–99)
Sodium: 141 mEq/L (ref 135–145)

## 2013-07-08 LAB — CBC WITH DIFFERENTIAL/PLATELET
Basophils Absolute: 0 10*3/uL (ref 0.0–0.1)
Basophils Relative: 0.4 % (ref 0.0–3.0)
Eosinophils Absolute: 0 10*3/uL (ref 0.0–0.7)
Lymphocytes Relative: 22.7 % (ref 12.0–46.0)
MCHC: 32.9 g/dL (ref 30.0–36.0)
Neutrophils Relative %: 69 % (ref 43.0–77.0)
Platelets: 192 10*3/uL (ref 150.0–400.0)
RBC: 3.71 Mil/uL — ABNORMAL LOW (ref 3.87–5.11)
RDW: 12.2 % (ref 11.5–14.6)

## 2013-07-08 LAB — URINALYSIS, ROUTINE W REFLEX MICROSCOPIC
Hgb urine dipstick: NEGATIVE
Leukocytes, UA: NEGATIVE

## 2013-07-08 NOTE — Telephone Encounter (Signed)
Notify pt that I reviewed her lab results and her hgb is stable.  Her kidney function normal.  Her urine appears that she might have a uti, but I would like to wait for culture given her multiple drug allergies.  If no acute symptoms, await culture.  If acute symptoms - let me know and will treat.

## 2013-07-09 NOTE — Telephone Encounter (Signed)
Pt notified & will await culture results

## 2013-07-10 DIAGNOSIS — M19019 Primary osteoarthritis, unspecified shoulder: Secondary | ICD-10-CM | POA: Diagnosis not present

## 2013-07-10 DIAGNOSIS — M47812 Spondylosis without myelopathy or radiculopathy, cervical region: Secondary | ICD-10-CM | POA: Diagnosis not present

## 2013-07-10 DIAGNOSIS — M159 Polyosteoarthritis, unspecified: Secondary | ICD-10-CM | POA: Diagnosis not present

## 2013-07-12 LAB — URINE CULTURE: Colony Count: >=100000

## 2013-07-13 ENCOUNTER — Telehealth: Payer: Self-pay | Admitting: Internal Medicine

## 2013-07-13 ENCOUNTER — Encounter: Payer: Self-pay | Admitting: Internal Medicine

## 2013-07-13 NOTE — Assessment & Plan Note (Signed)
On Omeprazole.  Symptoms controlled.    

## 2013-07-13 NOTE — Telephone Encounter (Signed)
Opened in error

## 2013-07-13 NOTE — Assessment & Plan Note (Signed)
Saw GI.  Recommended if hgb stable - no further w/up.  Follow cbc.  

## 2013-07-13 NOTE — Assessment & Plan Note (Signed)
Better.  Saw neurology.  See their note for details.  Discussed the gabapentin.  May retry with daughter around.  Follow.  Discussed possible side effects of the medication.

## 2013-07-13 NOTE — Assessment & Plan Note (Signed)
Persistent intermittent dizziness.  Saw neurology.  Appears to be doing some better.  Follow.  Slow position changes and movements.  Stay hydrated.  Support hose.

## 2013-07-13 NOTE — Assessment & Plan Note (Signed)
Blood pressure as outlined.  Same medication.  Follow metabolic panel.   

## 2013-07-13 NOTE — Assessment & Plan Note (Signed)
Received Reclast.  Continue vitamin D.  Follow.  Due reclast.  Check labs.

## 2013-07-13 NOTE — Assessment & Plan Note (Signed)
Stable.  Has seen [facility].  Persistent shoulder pain and limited rom.  Seeing Dr [facility].   

## 2013-07-13 NOTE — Assessment & Plan Note (Signed)
Shoulder pain and limited rom as outlined.  Continues to follow up with Dr Gavin Potters.

## 2013-07-13 NOTE — Progress Notes (Signed)
Subjective:    Patient ID: Heidi [name], female    DOB: [birth year], 79 y.o.   MRN: [id number]  HPI 79 year old female with past history of nephrolithiasis, hypertension, GERD, osteoarthritis and osteoporosis who comes in today for a scheduled follow up.  She is accompanied by her daughter.  History obtained from both of them.  States she is doing relatively well.  Her main complaint is that of shoulder pain.  Has seen Dr Gavin Potters.  Had an injection.  Did not make a big difference.  Has very limited rom.  It is hard for her to feed herself and fix her hair and get dressed.  She lives with her daughter.   States she is eating and drinking well.  No nausea or vomiting.  No bowel change.  Breathing stable.  We discussed her shoulder at length.  I am not sure what else can be done.  After discussion, it was decided to refer back to Dr Gavin Potters with the question - if anything more can be done.  Headache appears to be better.  She took the gabapentin on one occasion and felt it made her dizzy.  She was having some trouble with dizziness anyway.  Unclear if this was a medication side effect.  She is not taking the vitamin supplement recommended by neurology.  Did see neurology.  Refer to their note for details.  Overall other than her shoulder, she feels things are stable.    Past Medical History  Diagnosis Date  . Anemia   . Hypertension   . Osteoporosis     vitamin D deficiency, nasal miacalcin, reclast, previous rib fracture  . Asthma   . GERD (gastroesophageal reflux disease)   . Nephrolithiasis   . OA (osteoarthritis)     Left knee replacement, hands, shoulders, cervial spine  . Vitamin D deficiency   . Inflammatory arthritis     elevated ESR, negative temporal artery bx, low titer rheumatoid factor  . Pancreatitis     s/p cholecystectomy with ERCP and stone extraction    Current Outpatient Prescriptions on File Prior to Visit  Medication Sig Dispense Refill  . acetaminophen (TYLENOL)  650 MG CR tablet Take 1,300 mg by mouth 2 (two) times daily.       Marland Kitchen amLODipine (NORVASC) 5 MG tablet Take 1 tablet (5 mg total) by mouth daily.  90 tablet  3  . Calcium Carbonate-Vitamin D (CALCIUM 600+D) 600-200 MG-UNIT TABS Take 1 tablet by mouth 2 (two) times daily.      . Cholecalciferol (VITAMIN D) 2000 UNITS tablet Take 2,000 Units by mouth daily.      . metoprolol succinate (TOPROL-XL) 25 MG 24 hr tablet Take 0.5 tablets (12.5 mg total) by mouth daily.  30 tablet  5  . montelukast (SINGULAIR) 10 MG tablet Take 1 tablet (10 mg total) by mouth daily.  30 tablet  5  . omeprazole (PRILOSEC) 20 MG capsule Take 1 capsule (20 mg total) by mouth daily.  90 capsule  1  . thiamine (VITAMIN B-1) 100 MG tablet Take 100 mg by mouth daily.      . vitamin C (ASCORBIC ACID) 500 MG tablet Take 500 mg by mouth daily.      . vitamin E 400 UNIT capsule Take 400 Units by mouth daily.       No current facility-administered medications on file prior to visit.    Review of Systems Patient states headaches are some better.  Did see neurology.  We discussed trying the gabapentin.   No increased sinus or allergy symptoms.   No chest pain, tightness or palpitations.  No increased shortness of breath.  Breathing stable.  No nausea or vomiting.  No acid reflux, dysphagia or odynophagia.  Acid controlled on Omeprazole.  No abdominal pain or cramping.  No bowel change, such as diarrhea, constipation, BRBPR or melana.  No urine change.  Increased shoulder pain and limited rom.        Objective:   Physical Exam  Filed Vitals:   07/08/13 0959  BP: 110/70  Pulse: 60  Temp: 97.9 F (36.6 C)   Blood pressure recheck:  56/59  79 year old female in no acute distress.   HEENT:  Nares- clear.  Oropharynx - without lesions. NECK:  Supple.  Nontender.  No audible bruit.  HEART:  Appears to be regular. LUNGS:  No crackles or wheezing audible.  Respirations even and unlabored.  RADIAL PULSE:  Equal bilaterally.     ABDOMEN:  Soft, nontender.  Bowel sounds present and normal.  No audible abdominal bruit.      EXTREMITIES:  No increased edema present.  DP pulses palpable and equal bilaterally.      MSK:  Shoulder pain and limited rom with attempts at raising her arm - especially approaching 90 degrees.         Assessment & Plan:  COUGH.  Not reported as a significant issue today.  Continue omeprazole for acid reflux.  Treat allergies.    MSK.  Increased pain and limited rom in her shoulders - especially the left.  Has seen Dr Gavin Potters.  Last injection did not help.  Discussed with her and her daughter at length today.  I am not sure what more can be done.  After discussion, it was decided to refer back to Dr Gavin Potters for further evaluation and to discuss any further treatment recs.     HEALTH MAINTENANCE.  Physical 01/07/13.   Scheduled mammogram at that visit.  Need results if done.

## 2013-07-14 ENCOUNTER — Other Ambulatory Visit: Payer: Self-pay | Admitting: *Deleted

## 2013-07-14 MED ORDER — DOXYCYCLINE HYCLATE 100 MG PO TABS: 100.0000 mg | ORAL_TABLET | Freq: Two times a day (BID) | ORAL | Status: AC

## 2013-07-21 ENCOUNTER — Other Ambulatory Visit: Payer: Self-pay | Admitting: *Deleted

## 2013-07-21 MED ORDER — MONTELUKAST SODIUM 10 MG PO TABS: 10.0000 mg | ORAL_TABLET | Freq: Every day | ORAL | Status: DC

## 2013-07-22 DIAGNOSIS — R05 Cough: Secondary | ICD-10-CM | POA: Diagnosis not present

## 2013-07-22 DIAGNOSIS — R0982 Postnasal drip: Secondary | ICD-10-CM | POA: Diagnosis not present

## 2013-07-25 ENCOUNTER — Other Ambulatory Visit: Payer: Self-pay | Admitting: *Deleted

## 2013-07-25 MED ORDER — OMEPRAZOLE 20 MG PO CPDR: 20.0000 mg | DELAYED_RELEASE_CAPSULE | Freq: Every day | ORAL | Status: DC

## 2013-08-13 ENCOUNTER — Other Ambulatory Visit: Payer: Self-pay | Admitting: *Deleted

## 2013-08-13 MED ORDER — AMLODIPINE BESYLATE 5 MG PO TABS: 5.0000 mg | ORAL_TABLET | Freq: Every day | ORAL | Status: DC

## 2013-08-14 DIAGNOSIS — M25519 Pain in unspecified shoulder: Secondary | ICD-10-CM | POA: Diagnosis not present

## 2013-08-14 DIAGNOSIS — M810 Age-related osteoporosis without current pathological fracture: Secondary | ICD-10-CM | POA: Diagnosis not present

## 2013-08-14 DIAGNOSIS — M19019 Primary osteoarthritis, unspecified shoulder: Secondary | ICD-10-CM | POA: Diagnosis not present

## 2013-08-19 DIAGNOSIS — D2370 Other benign neoplasm of skin of unspecified lower limb, including hip: Secondary | ICD-10-CM | POA: Diagnosis not present

## 2013-08-19 DIAGNOSIS — M79609 Pain in unspecified limb: Secondary | ICD-10-CM | POA: Diagnosis not present

## 2013-08-30 ENCOUNTER — Emergency Department: Payer: Self-pay | Admitting: Emergency Medicine

## 2013-08-30 DIAGNOSIS — S20219A Contusion of unspecified front wall of thorax, initial encounter: Secondary | ICD-10-CM | POA: Diagnosis not present

## 2013-08-30 DIAGNOSIS — S298XXA Other specified injuries of thorax, initial encounter: Secondary | ICD-10-CM | POA: Diagnosis not present

## 2013-08-30 DIAGNOSIS — Z9079 Acquired absence of other genital organ(s): Secondary | ICD-10-CM | POA: Diagnosis not present

## 2013-08-30 DIAGNOSIS — R0789 Other chest pain: Secondary | ICD-10-CM | POA: Diagnosis not present

## 2013-08-30 DIAGNOSIS — R42 Dizziness and giddiness: Secondary | ICD-10-CM | POA: Diagnosis not present

## 2013-08-30 DIAGNOSIS — S60229A Contusion of unspecified hand, initial encounter: Secondary | ICD-10-CM | POA: Diagnosis not present

## 2013-08-30 DIAGNOSIS — S0993XA Unspecified injury of face, initial encounter: Secondary | ICD-10-CM | POA: Diagnosis not present

## 2013-08-30 DIAGNOSIS — I10 Essential (primary) hypertension: Secondary | ICD-10-CM | POA: Diagnosis not present

## 2013-08-30 DIAGNOSIS — Z87442 Personal history of urinary calculi: Secondary | ICD-10-CM | POA: Diagnosis not present

## 2013-08-30 DIAGNOSIS — M542 Cervicalgia: Secondary | ICD-10-CM | POA: Diagnosis not present

## 2013-08-30 DIAGNOSIS — S6990XA Unspecified injury of unspecified wrist, hand and finger(s), initial encounter: Secondary | ICD-10-CM | POA: Diagnosis not present

## 2013-08-30 DIAGNOSIS — R51 Headache: Secondary | ICD-10-CM | POA: Diagnosis not present

## 2013-08-30 DIAGNOSIS — R079 Chest pain, unspecified: Secondary | ICD-10-CM | POA: Diagnosis not present

## 2013-08-30 DIAGNOSIS — M79609 Pain in unspecified limb: Secondary | ICD-10-CM | POA: Diagnosis not present

## 2013-08-30 DIAGNOSIS — N390 Urinary tract infection, site not specified: Secondary | ICD-10-CM | POA: Diagnosis not present

## 2013-08-30 DIAGNOSIS — S0990XA Unspecified injury of head, initial encounter: Secondary | ICD-10-CM | POA: Diagnosis not present

## 2013-08-30 DIAGNOSIS — Z9089 Acquired absence of other organs: Secondary | ICD-10-CM | POA: Diagnosis not present

## 2013-08-30 LAB — COMPREHENSIVE METABOLIC PANEL
Albumin: 3.7 g/dL (ref 3.4–5.0)
Alkaline Phosphatase: 37 U/L — ABNORMAL LOW
Anion Gap: 4 — ABNORMAL LOW (ref 7–16)
Bilirubin,Total: 0.3 mg/dL (ref 0.2–1.0)
Calcium, Total: 9.9 mg/dL (ref 8.5–10.1)
Chloride: 103 mmol/L (ref 98–107)
Creatinine: 0.92 mg/dL (ref 0.60–1.30)
EGFR (African American): >60
EGFR (Non-African Amer.): 54 — ABNORMAL LOW
Osmolality: 283 (ref 275–301)
Potassium: 3.6 mmol/L (ref 3.5–5.1)
SGOT(AST): 14 U/L — ABNORMAL LOW (ref 15–37)
SGPT (ALT): 21 U/L (ref 12–78)
Sodium: 139 mmol/L (ref 136–145)
Total Protein: 6.6 g/dL (ref 6.4–8.2)

## 2013-08-30 LAB — CBC
HCT: 35.1 % (ref 35.0–47.0)
MCH: 31.6 pg (ref 26.0–34.0)
MCHC: 33 g/dL (ref 32.0–36.0)
Platelet: 196 10*3/uL (ref 150–440)
RBC: 3.67 10*6/uL — ABNORMAL LOW (ref 3.80–5.20)
RDW: 12.1 % (ref 11.5–14.5)
WBC: 9.4 10*3/uL (ref 3.6–11.0)

## 2013-08-30 LAB — URINALYSIS, COMPLETE
Bilirubin,UR: NEGATIVE
Blood: NEGATIVE
Leukocyte Esterase: NEGATIVE
Ph: 5 (ref 4.5–8.0)
Protein: 30
Specific Gravity: 1.032 (ref 1.003–1.030)

## 2013-08-30 LAB — PROTIME-INR: Prothrombin Time: 12.3 secs (ref 11.5–14.7)

## 2013-08-31 DIAGNOSIS — S6990XA Unspecified injury of unspecified wrist, hand and finger(s), initial encounter: Secondary | ICD-10-CM | POA: Diagnosis not present

## 2013-08-31 DIAGNOSIS — S298XXA Other specified injuries of thorax, initial encounter: Secondary | ICD-10-CM | POA: Diagnosis not present

## 2013-08-31 DIAGNOSIS — R079 Chest pain, unspecified: Secondary | ICD-10-CM | POA: Diagnosis not present

## 2013-08-31 DIAGNOSIS — M79609 Pain in unspecified limb: Secondary | ICD-10-CM | POA: Diagnosis not present

## 2013-09-01 ENCOUNTER — Telehealth: Payer: Self-pay | Admitting: *Deleted

## 2013-09-01 NOTE — Telephone Encounter (Signed)
PA form placed in Dr.scott folder

## 2013-09-01 NOTE — Telephone Encounter (Signed)
Called [phone] for prior authorization on the Zofran 4 mg, form is being faxed over

## 2013-09-02 NOTE — Telephone Encounter (Signed)
I spoke with pharmacy & they informed me that the Zofran Rx came from the ER. They also informed me that they notified the daughter that we would not do the PA in our office. I also called the daughter & left her a message on her voicemail

## 2013-09-02 NOTE — Telephone Encounter (Signed)
Daughter calling to check status of zofran.  Advised of PA process.

## 2013-09-09 ENCOUNTER — Ambulatory Visit: Payer: Medicare Other | Admitting: Internal Medicine

## 2013-09-09 ENCOUNTER — Encounter: Payer: Self-pay | Admitting: Internal Medicine

## 2013-09-09 ENCOUNTER — Telehealth: Payer: Self-pay | Admitting: Internal Medicine

## 2013-09-09 ENCOUNTER — Ambulatory Visit (INDEPENDENT_AMBULATORY_CARE_PROVIDER_SITE_OTHER): Payer: Medicare Other | Admitting: Internal Medicine

## 2013-09-09 VITALS — BP 120/70 | HR 84 | Temp 97.7°F | Ht 60.0 in | Wt 112.5 lb

## 2013-09-09 DIAGNOSIS — R21 Rash and other nonspecific skin eruption: Secondary | ICD-10-CM | POA: Diagnosis not present

## 2013-09-09 DIAGNOSIS — R350 Frequency of micturition: Secondary | ICD-10-CM | POA: Diagnosis not present

## 2013-09-09 LAB — URINALYSIS, ROUTINE W REFLEX MICROSCOPIC
Bilirubin Urine: NEGATIVE
Hgb urine dipstick: NEGATIVE
Leukocytes, UA: NEGATIVE
Nitrite: NEGATIVE
Specific Gravity, Urine: 1.025 (ref 1.000–1.030)
Total Protein, Urine: NEGATIVE
Urobilinogen, UA: 0.2 (ref 0.0–1.0)

## 2013-09-09 NOTE — Progress Notes (Signed)
Pre-visit discussion using our clinic review tool. No additional management support is needed unless otherwise documented below in the visit note.  

## 2013-09-09 NOTE — Telephone Encounter (Signed)
Pt coming to appt

## 2013-09-09 NOTE — Telephone Encounter (Signed)
We received a message on voice mail stating that they needed to cancel this appointment. When I called to see if the symptoms had improved the daughter Lanora Manis stated that her mom had only taken 2 days of the antibiotic that Raquel prescribed last week and she would also like you to look at her mothers sides since they are both bruised. She also stated that it will take them about an hour to get here.

## 2013-09-10 LAB — CULTURE, URINE COMPREHENSIVE: Organism ID, Bacteria: NO GROWTH

## 2013-09-12 ENCOUNTER — Encounter: Payer: Self-pay | Admitting: Internal Medicine

## 2013-09-12 DIAGNOSIS — R21 Rash and other nonspecific skin eruption: Secondary | ICD-10-CM | POA: Insufficient documentation

## 2013-09-12 DIAGNOSIS — R350 Frequency of micturition: Secondary | ICD-10-CM | POA: Insufficient documentation

## 2013-09-12 NOTE — Progress Notes (Signed)
Subjective:    Patient ID: Heidi [name], female    DOB: 02-20-1918, 80 y.o.   MRN: [id number]  Fall  80 year old female with past history of nephrolithiasis, hypertension, GERD, osteoarthritis and osteoporosis who comes in today as a work in s/p fall.  Fell on 12/18.  Stumbled and fell.  No LOC.  Went to ER.  Had head CT, xrays of ribs and chest xray.  Also had labs. No fracture found.  Was diagnosed with a uti.  Given abx.  After two days of abx, she developed a rash localized to her right lateral flank.  She is accompanied by her daughter.  History obtained from both of them.  States she is doing better.  Increased pain right lower posterior ribs.  No pain with deep breathing.  Eating and drinking.  No nausea or vomiting.  Erythematous rash - right posterior lateral ribs.  No bruising.  No abdominal pain or cramping.  Has some bruising and a knot on her left hip.  Some bruising down her left leg.  No pain.  Able to walk without pain.  No sob.     Past Medical History  Diagnosis Date  . Anemia   . Hypertension   . Osteoporosis     vitamin D deficiency, nasal miacalcin, reclast, previous rib fracture  . Asthma   . GERD (gastroesophageal reflux disease)   . Nephrolithiasis   . OA (osteoarthritis)     Left knee replacement, hands, shoulders, cervial spine  . Vitamin D deficiency   . Inflammatory arthritis     elevated ESR, negative temporal artery bx, low titer rheumatoid factor  . Pancreatitis     s/p cholecystectomy with ERCP and stone extraction    Current Outpatient Prescriptions on File Prior to Visit  Medication Sig Dispense Refill  . acetaminophen (TYLENOL) 650 MG CR tablet Take 1,300 mg by mouth 2 (two) times daily.       Marland Kitchen amLODipine (NORVASC) 5 MG tablet Take 1 tablet (5 mg total) by mouth daily.  90 tablet  1  . Calcium Carbonate-Vitamin D (CALCIUM 600+D) 600-200 MG-UNIT TABS Take 1 tablet by mouth 2 (two) times daily.      . Cholecalciferol (VITAMIN D) 2000 UNITS  tablet Take 2,000 Units by mouth daily.      . metoprolol succinate (TOPROL-XL) 25 MG 24 hr tablet Take 0.5 tablets (12.5 mg total) by mouth daily.  30 tablet  5  . montelukast (SINGULAIR) 10 MG tablet Take 1 tablet (10 mg total) by mouth daily.  30 tablet  5  . omeprazole (PRILOSEC) 20 MG capsule Take 1 capsule (20 mg total) by mouth daily.  90 capsule  1  . thiamine (VITAMIN B-1) 100 MG tablet Take 100 mg by mouth daily.      . vitamin C (ASCORBIC ACID) 500 MG tablet Take 500 mg by mouth daily.      . vitamin E 400 UNIT capsule Take 400 Units by mouth daily.       No current facility-administered medications on file prior to visit.    Review of Systems no headaches reported.  No increased sinus or allergy symptoms.   No chest pain, tightness or palpitations.  No increased shortness of breath.  Breathing stable.  No nausea or vomiting.  No acid reflux reported.   Acid controlled on Omeprazole.  No abdominal pain or cramping.  No bowel change.  No urine change.  Increased pain posterior lower ribs (right).  Rash  as outlined.  Per report.  Better.  W/up in ER.  No fracture.         Objective:   Physical Exam  Filed Vitals:   09/09/13 1342  BP: 120/70  Pulse: 84  Temp: 97.7 F (46.63 C)   80 year old female in no acute distress.   HEENT:  Nares- clear.  Oropharynx - without lesions. NECK:  Supple.  Nontender.  No audible bruit.  HEART:  Appears to be regular. LUNGS:  No crackles or wheezing audible.  Respirations even and unlabored.  Good breath sounds bilaterally.   RADIAL PULSE:  Equal bilaterally.    ABDOMEN:  Soft, nontender.  Bowel sounds present and normal.  No audible abdominal bruit.      EXTREMITIES:  Some pedal and ankle/lower leg edema (minimal).   No increased erythema or warmth.   DP pulses palpable and equal bilaterally.      MSK:  Increased pain right posterior lower ribs.  Increased soft tissue - left lateral hip (probable hematoma).  Able to stand and walk without  increased pain.  No pain in hip with abduction/adduction and rotation of the leg.    SKIN:  Erythematous rash - localized posterior lower right ribs.        Assessment & Plan:  MSK.  Right side/posterior lower rib pain.  Xray unrevealing.  Continue pain medication as needed.  Instructed on the importance of taking a deep breath, etc.  Follow closely.  Increased soft tissue - left hip.  Appears to be more c/w hematoma.  Should reabsorb.  No pain in the hip with weight bearing or abduction/adduction.  Follow closely.      HEALTH MAINTENANCE.  Physical 01/07/13.     I spent over 25 minutes with the patient and more than 50% of the time was spent in consultation regarding the above.

## 2013-09-12 NOTE — Assessment & Plan Note (Signed)
Off abx.  With the urinary frequency, check urinalysis and culture to confirm no infection.

## 2013-09-12 NOTE — Assessment & Plan Note (Signed)
Rash as outlined.  Unclear etiology.  Avoid heating pad, etc.  Allegra to see if this helps the itching.  Has improved.  Follow.

## 2013-09-17 ENCOUNTER — Ambulatory Visit: Payer: Medicare Other | Admitting: Internal Medicine

## 2013-09-18 ENCOUNTER — Ambulatory Visit: Payer: Medicare Other | Admitting: Internal Medicine

## 2013-09-25 ENCOUNTER — Encounter: Payer: Self-pay | Admitting: Internal Medicine

## 2013-09-25 ENCOUNTER — Ambulatory Visit (INDEPENDENT_AMBULATORY_CARE_PROVIDER_SITE_OTHER): Payer: Medicare Other | Admitting: Internal Medicine

## 2013-09-25 VITALS — BP 110/60 | HR 88 | Resp 18 | Ht 60.0 in | Wt 112.0 lb

## 2013-09-25 DIAGNOSIS — D649 Anemia, unspecified: Secondary | ICD-10-CM | POA: Diagnosis not present

## 2013-09-25 DIAGNOSIS — M810 Age-related osteoporosis without current pathological fracture: Secondary | ICD-10-CM | POA: Diagnosis not present

## 2013-09-25 DIAGNOSIS — M064 Inflammatory polyarthropathy: Secondary | ICD-10-CM | POA: Diagnosis not present

## 2013-09-25 DIAGNOSIS — K219 Gastro-esophageal reflux disease without esophagitis: Secondary | ICD-10-CM

## 2013-09-25 DIAGNOSIS — M1990 Unspecified osteoarthritis, unspecified site: Secondary | ICD-10-CM

## 2013-09-25 DIAGNOSIS — I10 Essential (primary) hypertension: Secondary | ICD-10-CM

## 2013-09-25 NOTE — Progress Notes (Signed)
Pre-visit discussion using our clinic review tool. No additional management support is needed unless otherwise documented below in the visit note.  

## 2013-09-25 NOTE — Assessment & Plan Note (Addendum)
Stable.  Has seen Dr Kernodle.  Persistent shoulder pain and limited rom.  Seeing Dr Kernodle.   

## 2013-09-25 NOTE — Progress Notes (Signed)
Subjective:    Patient ID: Heidi [name], female    DOB: 05/20/1918, 80 y.o.   MRN: [id number]  Fall  80 year old female with past history of nephrolithiasis, hypertension, GERD, osteoarthritis and osteoporosis who comes in today for follow up regarding a previous fall.   Fell on 12/18.  Stumbled and fell.  No LOC.  Went to ER.  Had head CT, xrays of ribs and chest xray.  Also had labs. No fracture found.  Was diagnosed with a uti.  Given abx.  After two days of abx, she developed a rash localized to her right lateral flank.  She is accompanied by her daughter.  History obtained from both of them.  States she is doing better.  No pain with deep breathing.  Eating and drinking.  No nausea or vomiting.  Erythematous rash - right posterior lateral ribs has essentially resolved.  No abdominal pain or cramping.  No sob.  Overall appears to be doing better.      Past Medical History  Diagnosis Date  . Anemia   . Hypertension   . Osteoporosis     vitamin D deficiency, nasal miacalcin, reclast, previous rib fracture  . Asthma   . GERD (gastroesophageal reflux disease)   . Nephrolithiasis   . OA (osteoarthritis)     Left knee replacement, hands, shoulders, cervial spine  . Vitamin D deficiency   . Inflammatory arthritis     elevated ESR, negative temporal artery bx, low titer rheumatoid factor  . Pancreatitis     s/p cholecystectomy with ERCP and stone extraction    Current Outpatient Prescriptions on File Prior to Visit  Medication Sig Dispense Refill  . acetaminophen (TYLENOL) 650 MG CR tablet Take 1,300 mg by mouth 2 (two) times daily.       Marland Kitchen amLODipine (NORVASC) 5 MG tablet Take 1 tablet (5 mg total) by mouth daily.  90 tablet  1  . Calcium Carbonate-Vitamin D (CALCIUM 600+D) 600-200 MG-UNIT TABS Take 1 tablet by mouth 2 (two) times daily.      . Cholecalciferol (VITAMIN D) 2000 UNITS tablet Take 2,000 Units by mouth daily.      . metoprolol succinate (TOPROL-XL) 25 MG 24 hr tablet  Take 0.5 tablets (12.5 mg total) by mouth daily.  30 tablet  5  . montelukast (SINGULAIR) 10 MG tablet Take 1 tablet (10 mg total) by mouth daily.  30 tablet  5  . omeprazole (PRILOSEC) 20 MG capsule Take 1 capsule (20 mg total) by mouth daily.  90 capsule  1  . thiamine (VITAMIN B-1) 100 MG tablet Take 100 mg by mouth daily.      . vitamin C (ASCORBIC ACID) 500 MG tablet Take 500 mg by mouth daily.      . vitamin E 400 UNIT capsule Take 400 Units by mouth daily.       No current facility-administered medications on file prior to visit.    Review of Systems no headaches reported.  No increased sinus or allergy symptoms.   No chest pain, tightness or palpitations.  No increased shortness of breath.  Breathing stable.  No nausea or vomiting.  No acid reflux reported.   Acid controlled on Omeprazole.  No abdominal pain or cramping.  No bowel change.  No urine change.  Pain had essentially resolved.  Started flaring again yesterday.  Not as bad.  No pain with deep breathing.  Overall doing better.  Objective:   Physical Exam  Filed Vitals:   09/25/13 1431  BP: 110/60  Pulse: 88  Resp: 51   80 year old female in no acute distress.   HEENT:  Nares- clear.  Oropharynx - without lesions. NECK:  Supple.  Nontender.  No audible bruit.  HEART:  Appears to be regular. LUNGS:  No crackles or wheezing audible.  Respirations even and unlabored.  Good breath sounds bilaterally.   RADIAL PULSE:  Equal bilaterally.    ABDOMEN:  Soft, nontender.  Bowel sounds present and normal.  No audible abdominal bruit.      EXTREMITIES:  Some pedal and ankle/lower leg edema (minimal).   No increased erythema or warmth.   DP pulses palpable and equal bilaterally.      MSK:  Improved pain right posterior lateral ribs.      SKIN:  Erythematous rash improved.          Assessment & Plan:  MSK.  Right side/posterior lower rib pain.  Xray unrevealing.  Continue pain medication as needed.  Better.  Follow.     HEALTH MAINTENANCE.  Physical 01/07/13.     I spent over 25 minutes with the patient and more than 50% of the time was spent in consultation regarding the above.

## 2013-09-28 ENCOUNTER — Encounter: Payer: Self-pay | Admitting: Internal Medicine

## 2013-09-28 NOTE — Assessment & Plan Note (Addendum)
Received Reclast.  Continue vitamin D.  Follow.  Check when last received reclast.

## 2013-09-28 NOTE — Assessment & Plan Note (Signed)
Saw GI.  Recommended if hgb stable - no further w/up.  Follow cbc.  

## 2013-09-28 NOTE — Assessment & Plan Note (Signed)
Blood pressure doing well.  Same medication.  Follow metabolic panel.   

## 2013-09-28 NOTE — Assessment & Plan Note (Signed)
On Omeprazole.  Symptoms controlled.    

## 2013-09-28 NOTE — Assessment & Plan Note (Signed)
Shoulder pain and limited rom previously.   Continues to follow up with Dr Jefm Bryant.

## 2013-11-04 ENCOUNTER — Telehealth: Payer: Self-pay | Admitting: Internal Medicine

## 2013-11-04 NOTE — Telephone Encounter (Signed)
Pt daughter called. She stated her mom needs the thing that goes around the commode that will help her get up.  [facility] [place]  Please advise when this is done

## 2013-11-05 NOTE — Telephone Encounter (Signed)
rx written for toilet seat elevator.  In your basket.

## 2013-11-05 NOTE — Telephone Encounter (Signed)
Rx faxed to [facility] [place] notification left on daughter cellphone

## 2013-11-11 DIAGNOSIS — IMO0002 Reserved for concepts with insufficient information to code with codable children: Secondary | ICD-10-CM | POA: Diagnosis not present

## 2013-11-11 DIAGNOSIS — M79609 Pain in unspecified limb: Secondary | ICD-10-CM | POA: Diagnosis not present

## 2013-12-19 ENCOUNTER — Ambulatory Visit: Payer: Self-pay | Admitting: Unknown Physician Specialty

## 2013-12-19 DIAGNOSIS — M25579 Pain in unspecified ankle and joints of unspecified foot: Secondary | ICD-10-CM | POA: Diagnosis not present

## 2013-12-19 DIAGNOSIS — M79609 Pain in unspecified limb: Secondary | ICD-10-CM | POA: Diagnosis not present

## 2013-12-19 DIAGNOSIS — M25569 Pain in unspecified knee: Secondary | ICD-10-CM | POA: Diagnosis not present

## 2013-12-19 DIAGNOSIS — Z96659 Presence of unspecified artificial knee joint: Secondary | ICD-10-CM | POA: Diagnosis not present

## 2013-12-24 DIAGNOSIS — M6580 Other synovitis and tenosynovitis, unspecified site: Secondary | ICD-10-CM | POA: Diagnosis not present

## 2013-12-25 DIAGNOSIS — Q828 Other specified congenital malformations of skin: Secondary | ICD-10-CM | POA: Diagnosis not present

## 2014-01-13 ENCOUNTER — Encounter: Payer: Medicare Other | Admitting: Internal Medicine

## 2014-01-20 ENCOUNTER — Encounter: Payer: Self-pay | Admitting: *Deleted

## 2014-01-20 ENCOUNTER — Ambulatory Visit (INDEPENDENT_AMBULATORY_CARE_PROVIDER_SITE_OTHER): Payer: Medicare Other | Admitting: Internal Medicine

## 2014-01-20 ENCOUNTER — Encounter (INDEPENDENT_AMBULATORY_CARE_PROVIDER_SITE_OTHER): Payer: Self-pay

## 2014-01-20 ENCOUNTER — Encounter: Payer: Self-pay | Admitting: Internal Medicine

## 2014-01-20 VITALS — BP 120/70 | HR 74 | Temp 98.2°F | Ht 60.0 in | Wt 115.5 lb

## 2014-01-20 DIAGNOSIS — D649 Anemia, unspecified: Secondary | ICD-10-CM | POA: Diagnosis not present

## 2014-01-20 DIAGNOSIS — M810 Age-related osteoporosis without current pathological fracture: Secondary | ICD-10-CM

## 2014-01-20 DIAGNOSIS — R609 Edema, unspecified: Secondary | ICD-10-CM | POA: Diagnosis not present

## 2014-01-20 DIAGNOSIS — I10 Essential (primary) hypertension: Secondary | ICD-10-CM

## 2014-01-20 DIAGNOSIS — M79609 Pain in unspecified limb: Secondary | ICD-10-CM | POA: Diagnosis not present

## 2014-01-20 DIAGNOSIS — M79606 Pain in leg, unspecified: Secondary | ICD-10-CM | POA: Insufficient documentation

## 2014-01-20 DIAGNOSIS — M1990 Unspecified osteoarthritis, unspecified site: Secondary | ICD-10-CM

## 2014-01-20 DIAGNOSIS — R51 Headache: Secondary | ICD-10-CM | POA: Diagnosis not present

## 2014-01-20 DIAGNOSIS — M064 Inflammatory polyarthropathy: Secondary | ICD-10-CM

## 2014-01-20 DIAGNOSIS — R600 Localized edema: Secondary | ICD-10-CM

## 2014-01-20 DIAGNOSIS — K219 Gastro-esophageal reflux disease without esophagitis: Secondary | ICD-10-CM

## 2014-01-20 LAB — CBC WITH DIFFERENTIAL/PLATELET
BASOS ABS: 0 10*3/uL (ref 0.0–0.1)
Basophils Relative: 0.3 % (ref 0.0–3.0)
EOS ABS: 0 10*3/uL (ref 0.0–0.7)
Eosinophils Relative: 0.3 % (ref 0.0–5.0)
HCT: 35.2 % — ABNORMAL LOW (ref 36.0–46.0)
HEMOGLOBIN: 11.7 g/dL — AB (ref 12.0–15.0)
LYMPHS PCT: 18.3 % (ref 12.0–46.0)
Lymphs Abs: 1.4 10*3/uL (ref 0.7–4.0)
MCHC: 33.2 g/dL (ref 30.0–36.0)
MCV: 95.3 fl (ref 78.0–100.0)
Monocytes Absolute: 0.6 10*3/uL (ref 0.1–1.0)
Monocytes Relative: 8.4 % (ref 3.0–12.0)
Neutro Abs: 5.4 10*3/uL (ref 1.4–7.7)
Neutrophils Relative %: 72.7 % (ref 43.0–77.0)
Platelets: 204 10*3/uL (ref 150.0–400.0)
RBC: 3.69 Mil/uL — ABNORMAL LOW (ref 3.87–5.11)
RDW: 12.5 % (ref 11.5–15.5)
WBC: 7.4 10*3/uL (ref 4.0–10.5)

## 2014-01-20 LAB — COMPREHENSIVE METABOLIC PANEL
ALK PHOS: 34 U/L — AB (ref 39–117)
ALT: 10 U/L (ref 0–35)
AST: 19 U/L (ref 0–37)
Albumin: 4.1 g/dL (ref 3.5–5.2)
BILIRUBIN TOTAL: 0.9 mg/dL (ref 0.2–1.2)
BUN: 17 mg/dL (ref 6–23)
CO2: 30 meq/L (ref 19–32)
Calcium: 10.4 mg/dL (ref 8.4–10.5)
Chloride: 103 mEq/L (ref 96–112)
Creatinine, Ser: 0.9 mg/dL (ref 0.4–1.2)
GFR: 62.01 mL/min (ref >60.00)
Glucose, Bld: 92 mg/dL (ref 70–99)
Potassium: 4.7 mEq/L (ref 3.5–5.1)
SODIUM: 140 meq/L (ref 135–145)
Total Protein: 6.7 g/dL (ref 6.0–8.3)

## 2014-01-20 LAB — SEDIMENTATION RATE: Sed Rate: 20 mm/hr (ref 0–22)

## 2014-01-20 LAB — FERRITIN: Ferritin: 27.8 ng/mL (ref 10.0–291.0)

## 2014-01-20 LAB — TSH: TSH: 0.9 u[IU]/mL (ref 0.35–4.50)

## 2014-01-20 MED ORDER — FLUTICASONE PROPIONATE 50 MCG/ACT NA SUSP: 2.0000 | Freq: Every day | NASAL | Status: DC

## 2014-01-20 NOTE — Progress Notes (Signed)
Pre visit review using our clinic review tool, if applicable. No additional management support is needed unless otherwise documented below in the visit note. 

## 2014-01-20 NOTE — Patient Instructions (Signed)
Take Prilosec (omeprazole) 30 minutes before breakfast.  If persistent symptoms, then start zantac (ranitidine) 150mg  - take 30 minutes before your evening meal.   If persistent gas, then start align - one per day.    Saline nasal spray - flush nose at least 2-3x/day.  Flonase nasal spray - 2 sprays each nostril one time per day.  Do in the evening.

## 2014-01-20 NOTE — Progress Notes (Signed)
Subjective:    Patient ID: Heidi [name], female    DOB: 1918/06/13, 80 y.o.   MRN: [id number]  HPI 80 year old female with past history of nephrolithiasis, hypertension, GERD, osteoarthritis and osteoporosis who comes in today for a scheduled follow up.  She is accompanied by her daughter.  History obtained from both of them.  States she is doing relatively well.  Has noticed some leg swelling.  Is better in the am and worse in the evening.  Does not wear compression hose.  Does wear support hose if she goes out.  Has had shoulder pain.  Has seen Dr Jefm Bryant.  Had an injection.  Did not make a big difference.  Has very limited rom.  It is hard for her to feed herself and fix her hair and get dressed.  She saw Dr Jefm Bryant in f/u regarding her shoulder.  He did not feel there was anything more that could be done. Did offer her a f/u injection.  She declined.  She lives with her daughter.   States she is eating and drinking well.  No nausea or vomiting.  No bowel change.  Breathing stable.  She also reports some persistent headaches.  Increased drainage.  Mostly frontal headache.  Due to get her eyes checked 03/04/14.  No severe headache.     Past Medical History  Diagnosis Date  . Anemia   . Hypertension   . Osteoporosis     vitamin D deficiency, nasal miacalcin, reclast, previous rib fracture  . Asthma   . GERD (gastroesophageal reflux disease)   . Nephrolithiasis   . OA (osteoarthritis)     Left knee replacement, hands, shoulders, cervial spine  . Vitamin D deficiency   . Inflammatory arthritis     elevated ESR, negative temporal artery bx, low titer rheumatoid factor  . Pancreatitis     s/p cholecystectomy with ERCP and stone extraction    Current Outpatient Prescriptions on File Prior to Visit  Medication Sig Dispense Refill  . acetaminophen (TYLENOL) 650 MG CR tablet Take 1,300 mg by mouth 2 (two) times daily.       Marland Kitchen amLODipine (NORVASC) 5 MG tablet Take 1 tablet (5 mg total)  by mouth daily.  90 tablet  1  . Calcium Carbonate-Vitamin D (CALCIUM 600+D) 600-200 MG-UNIT TABS Take 1 tablet by mouth 2 (two) times daily.      . Cholecalciferol (VITAMIN D) 2000 UNITS tablet Take 2,000 Units by mouth daily.      . metoprolol succinate (TOPROL-XL) 25 MG 24 hr tablet Take 0.5 tablets (12.5 mg total) by mouth daily.  30 tablet  5  . montelukast (SINGULAIR) 10 MG tablet Take 1 tablet (10 mg total) by mouth daily.  30 tablet  5  . omeprazole (PRILOSEC) 20 MG capsule Take 1 capsule (20 mg total) by mouth daily.  90 capsule  1  . thiamine (VITAMIN B-1) 100 MG tablet Take 100 mg by mouth daily.      . vitamin C (ASCORBIC ACID) 500 MG tablet Take 500 mg by mouth daily.      . vitamin E 400 UNIT capsule Take 400 Units by mouth daily.       No current facility-administered medications on file prior to visit.    Review of Systems Still with headaches.  Did see neurology.  No increased sinus or allergy symptoms.   Some increased drainage.  Dripping nose.  No chest pain, tightness or palpitations.  No increased shortness of  breath.  Breathing stable.  No nausea or vomiting.  No acid reflux, dysphagia or odynophagia.  Acid controlled has been controlled on Omeprazole.  Some burping occasionally.  No abdominal pain or cramping.  No bowel change, such as diarrhea, constipation, BRBPR or melana.  No urine change.  Increased shoulder pain and limited rom.   Leg swelling as outlined.       Objective:   Physical Exam  Filed Vitals:   01/20/14 0800  BP: 120/70  Pulse: 74  Temp: 98.2 F (26.66 C)   80 year old female in no acute distress.   HEENT:  Nares- clear.  Oropharynx - without lesions.  No tenderness to palpation over the temporal arteries.   NECK:  Supple.  Nontender.  No audible bruit.  HEART:  Appears to be regular. LUNGS:  No crackles or wheezing audible.  Respirations even and unlabored.  RADIAL PULSE:  Equal bilaterally.    ABDOMEN:  Soft, nontender.  Bowel sounds present  and normal.  No audible abdominal bruit.      EXTREMITIES:  No increased edema present.  DP pulses palpable and equal bilaterally.      MSK:  Shoulder pain and limited rom with attempts at raising her arm - especially approaching 90 degrees.         Assessment & Plan:  MSK.  Increased pain and limited rom in her shoulders - especially the left.  Has seen Dr Jefm Bryant.  Last injection did not help.  Discussed with her and her daughter at length today.  I am not sure what more can be done.  She saw Dr Jefm Bryant. Offered another injection.  She declined.  Follow.       HEALTH MAINTENANCE.  Physical 01/07/13.   Scheduled mammogram at that visit.  Need results if done.    I spent 40 minutes with the patient and more than 50% of the time was spent in consultation regarding the above.

## 2014-01-26 ENCOUNTER — Encounter: Payer: Self-pay | Admitting: Internal Medicine

## 2014-01-26 NOTE — Assessment & Plan Note (Signed)
States worse in the evening.  Better in the am.  Discussed compression hose.

## 2014-01-26 NOTE — Assessment & Plan Note (Signed)
Received Reclast.  Continue vitamin D.  Follow.

## 2014-01-26 NOTE — Assessment & Plan Note (Signed)
Saw neurology.  See their note for details.  Persistent.  Check ESR.  May need f/u with neurology.

## 2014-01-26 NOTE — Assessment & Plan Note (Signed)
Blood pressure doing well.  Same medication.  Follow metabolic panel.   

## 2014-01-26 NOTE — Assessment & Plan Note (Signed)
Saw GI.  Recommended if hgb stable - no further w/up.  Follow cbc.  

## 2014-01-26 NOTE — Assessment & Plan Note (Signed)
Stable.  Has seen [facility].  Persistent shoulder pain and limited rom.  Seeing Dr [facility].   

## 2014-01-26 NOTE — Assessment & Plan Note (Signed)
Shoulder pain and limited rom previously.   Has seen Dr Jefm Bryant.  Offered injection.  She declined.  Last injection did not help.  Follow.

## 2014-01-26 NOTE — Assessment & Plan Note (Signed)
On Omeprazole.  Symptoms controlled.    

## 2014-01-29 ENCOUNTER — Other Ambulatory Visit: Payer: Self-pay | Admitting: Internal Medicine

## 2014-01-29 MED ORDER — MONTELUKAST SODIUM 10 MG PO TABS: 10.0000 mg | ORAL_TABLET | Freq: Every day | ORAL | Status: DC

## 2014-01-29 MED ORDER — OMEPRAZOLE 20 MG PO CPDR: 20.0000 mg | DELAYED_RELEASE_CAPSULE | Freq: Every day | ORAL | Status: DC

## 2014-02-12 ENCOUNTER — Telehealth: Payer: Self-pay | Admitting: Internal Medicine

## 2014-02-13 ENCOUNTER — Other Ambulatory Visit: Payer: Self-pay | Admitting: *Deleted

## 2014-02-13 MED ORDER — AMLODIPINE BESYLATE 5 MG PO TABS
5.0000 mg | ORAL_TABLET | Freq: Every day | ORAL | Status: DC
Start: 2014-02-13 — End: 2014-08-11

## 2014-02-17 DIAGNOSIS — R21 Rash and other nonspecific skin eruption: Secondary | ICD-10-CM | POA: Diagnosis not present

## 2014-03-10 ENCOUNTER — Other Ambulatory Visit: Payer: Self-pay | Admitting: *Deleted

## 2014-03-10 MED ORDER — METOPROLOL SUCCINATE ER 25 MG PO TB24: 12.5000 mg | ORAL_TABLET | Freq: Every day | ORAL | Status: DC

## 2014-03-11 DIAGNOSIS — H353190 Nonexudative age-related macular degeneration, unspecified eye, stage unspecified: Secondary | ICD-10-CM | POA: Diagnosis not present

## 2014-03-17 DIAGNOSIS — M810 Age-related osteoporosis without current pathological fracture: Secondary | ICD-10-CM | POA: Diagnosis not present

## 2014-03-17 DIAGNOSIS — M6580 Other synovitis and tenosynovitis, unspecified site: Secondary | ICD-10-CM | POA: Diagnosis not present

## 2014-03-17 DIAGNOSIS — D2370 Other benign neoplasm of skin of unspecified lower limb, including hip: Secondary | ICD-10-CM | POA: Diagnosis not present

## 2014-03-17 DIAGNOSIS — M25519 Pain in unspecified shoulder: Secondary | ICD-10-CM | POA: Diagnosis not present

## 2014-03-17 DIAGNOSIS — M064 Inflammatory polyarthropathy: Secondary | ICD-10-CM | POA: Diagnosis not present

## 2014-03-17 DIAGNOSIS — M1990 Unspecified osteoarthritis, unspecified site: Secondary | ICD-10-CM | POA: Diagnosis not present

## 2014-04-23 ENCOUNTER — Encounter: Payer: Self-pay | Admitting: Internal Medicine

## 2014-04-23 ENCOUNTER — Ambulatory Visit (INDEPENDENT_AMBULATORY_CARE_PROVIDER_SITE_OTHER): Payer: Medicare Other | Admitting: Internal Medicine

## 2014-04-23 VITALS — BP 120/60 | HR 89 | Temp 97.8°F | Ht 59.0 in | Wt 112.5 lb

## 2014-04-23 DIAGNOSIS — M19019 Primary osteoarthritis, unspecified shoulder: Secondary | ICD-10-CM

## 2014-04-23 DIAGNOSIS — D649 Anemia, unspecified: Secondary | ICD-10-CM | POA: Diagnosis not present

## 2014-04-23 DIAGNOSIS — I10 Essential (primary) hypertension: Secondary | ICD-10-CM

## 2014-04-23 DIAGNOSIS — K219 Gastro-esophageal reflux disease without esophagitis: Secondary | ICD-10-CM | POA: Diagnosis not present

## 2014-04-23 DIAGNOSIS — R600 Localized edema: Secondary | ICD-10-CM

## 2014-04-23 DIAGNOSIS — M064 Inflammatory polyarthropathy: Secondary | ICD-10-CM

## 2014-04-23 DIAGNOSIS — M1990 Unspecified osteoarthritis, unspecified site: Secondary | ICD-10-CM

## 2014-04-23 DIAGNOSIS — M1380 Other specified arthritis, unspecified site: Secondary | ICD-10-CM

## 2014-04-23 DIAGNOSIS — R609 Edema, unspecified: Secondary | ICD-10-CM

## 2014-04-23 LAB — CBC WITH DIFFERENTIAL/PLATELET
BASOS PCT: 0.5 % (ref 0.0–3.0)
Basophils Absolute: 0 10*3/uL (ref 0.0–0.1)
Eosinophils Absolute: 0 10*3/uL (ref 0.0–0.7)
Eosinophils Relative: 0.5 % (ref 0.0–5.0)
HCT: 34.4 % — ABNORMAL LOW (ref 36.0–46.0)
HEMOGLOBIN: 11.4 g/dL — AB (ref 12.0–15.0)
LYMPHS PCT: 21.7 % (ref 12.0–46.0)
Lymphs Abs: 1.3 10*3/uL (ref 0.7–4.0)
MCHC: 33 g/dL (ref 30.0–36.0)
MCV: 95.6 fl (ref 78.0–100.0)
MONOS PCT: 9.4 % (ref 3.0–12.0)
Monocytes Absolute: 0.6 10*3/uL (ref 0.1–1.0)
Neutro Abs: 4.1 10*3/uL (ref 1.4–7.7)
Neutrophils Relative %: 67.9 % (ref 43.0–77.0)
Platelets: 220 10*3/uL (ref 150.0–400.0)
RBC: 3.6 Mil/uL — AB (ref 3.87–5.11)
RDW: 12.6 % (ref 11.5–15.5)
WBC: 6 10*3/uL (ref 4.0–10.5)

## 2014-04-23 LAB — COMPREHENSIVE METABOLIC PANEL
ALBUMIN: 3.9 g/dL (ref 3.5–5.2)
ALT: 10 U/L (ref 0–35)
AST: 19 U/L (ref 0–37)
Alkaline Phosphatase: 34 U/L — ABNORMAL LOW (ref 39–117)
BUN: 20 mg/dL (ref 6–23)
CHLORIDE: 104 meq/L (ref 96–112)
CO2: 26 mEq/L (ref 19–32)
Calcium: 9.7 mg/dL (ref 8.4–10.5)
Creatinine, Ser: 1.1 mg/dL (ref 0.4–1.2)
GFR: 50.76 mL/min — ABNORMAL LOW (ref >60.00)
GLUCOSE: 142 mg/dL — AB (ref 70–99)
POTASSIUM: 3.6 meq/L (ref 3.5–5.1)
Sodium: 138 mEq/L (ref 135–145)
TOTAL PROTEIN: 6.1 g/dL (ref 6.0–8.3)
Total Bilirubin: 0.8 mg/dL (ref 0.2–1.2)

## 2014-04-23 LAB — FERRITIN: Ferritin: 28.8 ng/mL (ref 10.0–291.0)

## 2014-04-23 NOTE — Progress Notes (Signed)
Pre visit review using our clinic review tool, if applicable. No additional management support is needed unless otherwise documented below in the visit note. 

## 2014-04-24 ENCOUNTER — Other Ambulatory Visit: Payer: Self-pay | Admitting: Internal Medicine

## 2014-04-24 ENCOUNTER — Encounter: Payer: Self-pay | Admitting: *Deleted

## 2014-04-24 DIAGNOSIS — N289 Disorder of kidney and ureter, unspecified: Secondary | ICD-10-CM

## 2014-04-24 NOTE — Progress Notes (Signed)
F/u met b ordered.  

## 2014-04-26 ENCOUNTER — Encounter: Payer: Self-pay | Admitting: Internal Medicine

## 2014-04-26 NOTE — Assessment & Plan Note (Signed)
Stable.  Has seen Dr Jefm Bryant.  Persistent shoulder pain and limited rom.  Seeing Dr Jefm Bryant.

## 2014-04-26 NOTE — Assessment & Plan Note (Signed)
Blood pressure doing well.  Same medication.  Follow metabolic panel.

## 2014-04-26 NOTE — Assessment & Plan Note (Signed)
Saw GI.  Recommended if hgb stable - no further w/up.  Follow cbc.  

## 2014-04-26 NOTE — Progress Notes (Signed)
Subjective:    Patient ID: Heidi [name], female    DOB: Feb 23, 1918, 80 y.o.   MRN: [id number]  HPI 80 year old female with past history of nephrolithiasis, hypertension, GERD, osteoarthritis and osteoporosis who comes in today to follow up on these issues as well as for a complete physical exam.   She is accompanied by her daughter.  History obtained from both of them.  States she is doing relatively well.  Has had shoulder pain.  Has seen Dr Jefm Bryant.  Had an injection.  Did not make a big difference.  Had f/u and had fluid aspirated.  This helped for while.  Has very limited rom.  It is hard for her to feed herself and fix her hair and get dressed.  She saw Dr Jefm Bryant in f/u regarding her shoulder.  He did not feel there was anything more that could be done.  She lives with her daughter.   States she is eating and drinking well.  No nausea or vomiting.  No bowel change.  Breathing stable.  Overall she feels things are stable.     Past Medical History  Diagnosis Date  . Anemia   . Hypertension   . Osteoporosis     vitamin D deficiency, nasal miacalcin, reclast, previous rib fracture  . Asthma   . GERD (gastroesophageal reflux disease)   . Nephrolithiasis   . OA (osteoarthritis)     Left knee replacement, hands, shoulders, cervial spine  . Vitamin D deficiency   . Inflammatory arthritis     elevated ESR, negative temporal artery bx, low titer rheumatoid factor  . Pancreatitis     s/p cholecystectomy with ERCP and stone extraction    Current Outpatient Prescriptions on File Prior to Visit  Medication Sig Dispense Refill  . acetaminophen (TYLENOL) 650 MG CR tablet Take 1,300 mg by mouth 2 (two) times daily.       Marland Kitchen amLODipine (NORVASC) 5 MG tablet Take 1 tablet (5 mg total) by mouth daily.  90 tablet  1  . Calcium Carbonate-Vitamin D (CALCIUM 600+D) 600-200 MG-UNIT TABS Take 1 tablet by mouth 2 (two) times daily.      . Cholecalciferol (VITAMIN D) 2000 UNITS tablet Take 2,000  Units by mouth daily.      . fluticasone (FLONASE) 50 MCG/ACT nasal spray Place 2 sprays into both nostrils daily.  16 g  1  . metoprolol succinate (TOPROL-XL) 25 MG 24 hr tablet Take 0.5 tablets (12.5 mg total) by mouth daily.  15 tablet  5  . montelukast (SINGULAIR) 10 MG tablet Take 1 tablet (10 mg total) by mouth daily.  30 tablet  11  . omeprazole (PRILOSEC) 20 MG capsule Take 1 capsule (20 mg total) by mouth daily.  90 capsule  1  . thiamine (VITAMIN B-1) 100 MG tablet Take 100 mg by mouth daily.      . vitamin C (ASCORBIC ACID) 500 MG tablet Take 500 mg by mouth daily.      . vitamin E 400 UNIT capsule Take 400 Units by mouth daily.       No current facility-administered medications on file prior to visit.    Review of Systems No headache reported today.  No increased sinus or allergy symptoms.   No chest pain, tightness or palpitations.  No increased shortness of breath.  Breathing stable.  No nausea or vomiting.  No acid reflux, dysphagia or odynophagia.  Acid controlled has been controlled on Omeprazole.  No abdominal pain  or cramping.  No bowel change, such as diarrhea, constipation, BRBPR or melana.  No urine change.  Increased shoulder pain and limited rom.   Leg swelling improved.       Objective:   Physical Exam  Filed Vitals:   04/23/14 1325  BP: 120/60  Pulse: 89  Temp: 97.8 F (36.6 C)   Blood pressure recheck:  28/74  80 year old female in no acute distress.   HEENT:  Nares- clear.  Oropharynx - without lesions.   NECK:  Supple.  Nontender.  No audible bruit.  HEART:  Appears to be regular. LUNGS:  No crackles or wheezing audible.  Respirations even and unlabored.  RADIAL PULSE:  Equal bilaterally.   BREASTS:  No nipple discharge or nipple retraction present.  Could not appreciate any axillary adenopathy.  No palpable nodules.    ABDOMEN:  Soft, nontender.  Bowel sounds present and normal.  No audible abdominal bruit.      EXTREMITIES:  No increased edema  present.       MSK:  Shoulder pain and limited rom with attempts at raising her arm - especially approaching 90 degrees.         Assessment & Plan:  MSK.  Increased pain and limited rom in her shoulders - especially the left.  Has seen Dr Jefm Bryant.  See above.    HEALTH MAINTENANCE.  Physical today.  Desires not to have another mammogram.    I spent 25 minutes with the patient and more than 50% of the time was spent in consultation regarding the above.

## 2014-04-26 NOTE — Assessment & Plan Note (Signed)
Shoulder pain and limited rom previously.   Has seen Dr Jefm Bryant.

## 2014-04-26 NOTE — Assessment & Plan Note (Signed)
On Omeprazole.  Symptoms controlled.

## 2014-04-26 NOTE — Assessment & Plan Note (Signed)
Better.  Compression hose.   

## 2014-05-08 ENCOUNTER — Other Ambulatory Visit (INDEPENDENT_AMBULATORY_CARE_PROVIDER_SITE_OTHER): Payer: Medicare Other

## 2014-05-08 DIAGNOSIS — N289 Disorder of kidney and ureter, unspecified: Secondary | ICD-10-CM

## 2014-05-08 LAB — BASIC METABOLIC PANEL
BUN: 19 mg/dL (ref 6–23)
CALCIUM: 9.5 mg/dL (ref 8.4–10.5)
CO2: 28 mEq/L (ref 19–32)
Chloride: 106 mEq/L (ref 96–112)
Creatinine, Ser: 1 mg/dL (ref 0.4–1.2)
GFR: 55.52 mL/min — AB (ref >60.00)
Glucose, Bld: 112 mg/dL — ABNORMAL HIGH (ref 70–99)
POTASSIUM: 3.3 meq/L — AB (ref 3.5–5.1)
SODIUM: 142 meq/L (ref 135–145)

## 2014-05-09 ENCOUNTER — Other Ambulatory Visit: Payer: Self-pay | Admitting: Internal Medicine

## 2014-05-09 DIAGNOSIS — E876 Hypokalemia: Secondary | ICD-10-CM

## 2014-05-09 NOTE — Progress Notes (Signed)
Order placed for f/u potassium.  

## 2014-05-11 ENCOUNTER — Other Ambulatory Visit: Payer: Medicare Other

## 2014-05-19 ENCOUNTER — Other Ambulatory Visit: Payer: Medicare Other

## 2014-05-21 ENCOUNTER — Other Ambulatory Visit (INDEPENDENT_AMBULATORY_CARE_PROVIDER_SITE_OTHER): Payer: Medicare Other

## 2014-05-21 DIAGNOSIS — E876 Hypokalemia: Secondary | ICD-10-CM | POA: Diagnosis not present

## 2014-05-21 LAB — POTASSIUM: POTASSIUM: 3.9 meq/L (ref 3.5–5.1)

## 2014-05-22 ENCOUNTER — Encounter: Payer: Self-pay | Admitting: *Deleted

## 2014-05-28 DIAGNOSIS — M1610 Unilateral primary osteoarthritis, unspecified hip: Secondary | ICD-10-CM | POA: Diagnosis not present

## 2014-05-28 DIAGNOSIS — M169 Osteoarthritis of hip, unspecified: Secondary | ICD-10-CM | POA: Diagnosis not present

## 2014-05-28 DIAGNOSIS — M25559 Pain in unspecified hip: Secondary | ICD-10-CM | POA: Diagnosis not present

## 2014-06-09 DIAGNOSIS — Z23 Encounter for immunization: Secondary | ICD-10-CM | POA: Diagnosis not present

## 2014-07-30 DIAGNOSIS — D2370 Other benign neoplasm of skin of unspecified lower limb, including hip: Secondary | ICD-10-CM | POA: Diagnosis not present

## 2014-07-30 DIAGNOSIS — M79671 Pain in right foot: Secondary | ICD-10-CM | POA: Diagnosis not present

## 2014-07-30 DIAGNOSIS — M79672 Pain in left foot: Secondary | ICD-10-CM | POA: Diagnosis not present

## 2014-08-08 ENCOUNTER — Observation Stay: Payer: Self-pay | Admitting: Internal Medicine

## 2014-08-08 DIAGNOSIS — S0990XA Unspecified injury of head, initial encounter: Secondary | ICD-10-CM | POA: Diagnosis not present

## 2014-08-08 DIAGNOSIS — M810 Age-related osteoporosis without current pathological fracture: Secondary | ICD-10-CM | POA: Diagnosis not present

## 2014-08-08 DIAGNOSIS — M542 Cervicalgia: Secondary | ICD-10-CM | POA: Diagnosis not present

## 2014-08-08 DIAGNOSIS — Z8249 Family history of ischemic heart disease and other diseases of the circulatory system: Secondary | ICD-10-CM | POA: Diagnosis not present

## 2014-08-08 DIAGNOSIS — K219 Gastro-esophageal reflux disease without esophagitis: Secondary | ICD-10-CM | POA: Diagnosis not present

## 2014-08-08 DIAGNOSIS — S2241XA Multiple fractures of ribs, right side, initial encounter for closed fracture: Secondary | ICD-10-CM | POA: Diagnosis not present

## 2014-08-08 DIAGNOSIS — S0093XA Contusion of unspecified part of head, initial encounter: Secondary | ICD-10-CM | POA: Diagnosis not present

## 2014-08-08 DIAGNOSIS — I517 Cardiomegaly: Secondary | ICD-10-CM | POA: Diagnosis not present

## 2014-08-08 DIAGNOSIS — N390 Urinary tract infection, site not specified: Secondary | ICD-10-CM | POA: Diagnosis not present

## 2014-08-08 DIAGNOSIS — Z7982 Long term (current) use of aspirin: Secondary | ICD-10-CM | POA: Diagnosis not present

## 2014-08-08 DIAGNOSIS — R51 Headache: Secondary | ICD-10-CM | POA: Diagnosis not present

## 2014-08-08 DIAGNOSIS — S199XXA Unspecified injury of neck, initial encounter: Secondary | ICD-10-CM | POA: Diagnosis not present

## 2014-08-08 DIAGNOSIS — Z96652 Presence of left artificial knee joint: Secondary | ICD-10-CM | POA: Diagnosis not present

## 2014-08-08 DIAGNOSIS — Z79899 Other long term (current) drug therapy: Secondary | ICD-10-CM | POA: Diagnosis not present

## 2014-08-08 DIAGNOSIS — M1990 Unspecified osteoarthritis, unspecified site: Secondary | ICD-10-CM | POA: Diagnosis not present

## 2014-08-08 DIAGNOSIS — Z823 Family history of stroke: Secondary | ICD-10-CM | POA: Diagnosis not present

## 2014-08-08 DIAGNOSIS — M47892 Other spondylosis, cervical region: Secondary | ICD-10-CM | POA: Diagnosis not present

## 2014-08-08 DIAGNOSIS — I700 Atherosclerosis of aorta: Secondary | ICD-10-CM | POA: Diagnosis not present

## 2014-08-08 DIAGNOSIS — R52 Pain, unspecified: Secondary | ICD-10-CM | POA: Diagnosis not present

## 2014-08-08 DIAGNOSIS — J45909 Unspecified asthma, uncomplicated: Secondary | ICD-10-CM | POA: Diagnosis not present

## 2014-08-08 DIAGNOSIS — I10 Essential (primary) hypertension: Secondary | ICD-10-CM | POA: Diagnosis not present

## 2014-08-08 DIAGNOSIS — Z880 Allergy status to penicillin: Secondary | ICD-10-CM | POA: Diagnosis not present

## 2014-08-08 DIAGNOSIS — Z87442 Personal history of urinary calculi: Secondary | ICD-10-CM | POA: Diagnosis not present

## 2014-08-08 DIAGNOSIS — Z800 Family history of malignant neoplasm of digestive organs: Secondary | ICD-10-CM | POA: Diagnosis not present

## 2014-08-08 LAB — URINALYSIS, COMPLETE
BLOOD: NEGATIVE
Bilirubin,UR: NEGATIVE
GLUCOSE, UR: NEGATIVE mg/dL (ref 0–75)
KETONE: NEGATIVE
LEUKOCYTE ESTERASE: NEGATIVE
Nitrite: NEGATIVE
PROTEIN: NEGATIVE
Ph: 7 (ref 4.5–8.0)
RBC,UR: <1 /HPF (ref 0–5)
SPECIFIC GRAVITY: 1.009 (ref 1.003–1.030)
WBC UR: 1 /HPF (ref 0–5)

## 2014-08-08 LAB — BASIC METABOLIC PANEL
Anion Gap: 6 — ABNORMAL LOW (ref 7–16)
BUN: 23 mg/dL — ABNORMAL HIGH (ref 7–18)
CALCIUM: 9.7 mg/dL (ref 8.5–10.1)
CHLORIDE: 101 mmol/L (ref 98–107)
Co2: 30 mmol/L (ref 21–32)
Creatinine: 1.02 mg/dL (ref 0.60–1.30)
EGFR (African American): >60
GFR CALC NON AF AMER: 54 — AB
GLUCOSE: 111 mg/dL — AB (ref 65–99)
Osmolality: 278 (ref 275–301)
Potassium: 3.9 mmol/L (ref 3.5–5.1)
Sodium: 137 mmol/L (ref 136–145)

## 2014-08-08 LAB — CBC
HCT: 39 % (ref 35.0–47.0)
HGB: 12.5 g/dL (ref 12.0–16.0)
MCH: 31.3 pg (ref 26.0–34.0)
MCHC: 32 g/dL (ref 32.0–36.0)
MCV: 98 fL (ref 80–100)
PLATELETS: 220 10*3/uL (ref 150–440)
RBC: 4 10*6/uL (ref 3.80–5.20)
RDW: 12.6 % (ref 11.5–14.5)
WBC: 10.2 10*3/uL (ref 3.6–11.0)

## 2014-08-09 DIAGNOSIS — S2241XA Multiple fractures of ribs, right side, initial encounter for closed fracture: Secondary | ICD-10-CM | POA: Diagnosis not present

## 2014-08-09 DIAGNOSIS — N390 Urinary tract infection, site not specified: Secondary | ICD-10-CM | POA: Diagnosis not present

## 2014-08-09 DIAGNOSIS — I10 Essential (primary) hypertension: Secondary | ICD-10-CM | POA: Diagnosis not present

## 2014-08-09 DIAGNOSIS — K219 Gastro-esophageal reflux disease without esophagitis: Secondary | ICD-10-CM | POA: Diagnosis not present

## 2014-08-09 LAB — CBC WITH DIFFERENTIAL/PLATELET
BASOS PCT: 0.3 %
Basophil #: 0 10*3/uL (ref 0.0–0.1)
Eosinophil #: 0 10*3/uL (ref 0.0–0.7)
Eosinophil %: 0.3 %
HCT: 34.3 % — ABNORMAL LOW (ref 35.0–47.0)
HGB: 11.2 g/dL — ABNORMAL LOW (ref 12.0–16.0)
LYMPHS PCT: 16.2 %
Lymphocyte #: 1.3 10*3/uL (ref 1.0–3.6)
MCH: 31.4 pg (ref 26.0–34.0)
MCHC: 32.5 g/dL (ref 32.0–36.0)
MCV: 97 fL (ref 80–100)
Monocyte #: 0.8 x10 3/mm (ref 0.2–0.9)
Monocyte %: 10.2 %
NEUTROS ABS: 5.8 10*3/uL (ref 1.4–6.5)
NEUTROS PCT: 73 %
Platelet: 199 10*3/uL (ref 150–440)
RBC: 3.55 10*6/uL — ABNORMAL LOW (ref 3.80–5.20)
RDW: 12.6 % (ref 11.5–14.5)
WBC: 7.9 10*3/uL (ref 3.6–11.0)

## 2014-08-09 LAB — BASIC METABOLIC PANEL
ANION GAP: 6 — AB (ref 7–16)
BUN: 21 mg/dL — ABNORMAL HIGH (ref 7–18)
CALCIUM: 9.1 mg/dL (ref 8.5–10.1)
Chloride: 100 mmol/L (ref 98–107)
Co2: 30 mmol/L (ref 21–32)
Creatinine: 0.9 mg/dL (ref 0.60–1.30)
EGFR (Non-African Amer.): >60
GLUCOSE: 112 mg/dL — AB (ref 65–99)
Osmolality: 276 (ref 275–301)
Potassium: 3.8 mmol/L (ref 3.5–5.1)
Sodium: 136 mmol/L (ref 136–145)

## 2014-08-10 ENCOUNTER — Telehealth: Payer: Self-pay | Admitting: *Deleted

## 2014-08-10 ENCOUNTER — Telehealth: Payer: Self-pay | Admitting: Internal Medicine

## 2014-08-10 LAB — URINE CULTURE

## 2014-08-10 NOTE — Telephone Encounter (Signed)
Discharge date: 08/09/14  Transition Care Management Follow-up Telephone Call  How have you been since you were released from the hospital? Spoke with pt's daughter, Heidi [name], she states "she is still very sore, but doing well, she is getting around with her walker"     Do you understand why you were in the hospital? YES   Do you understand the discharge instrcutions? YES  Items Reviewed:  Medications reviewed: YES - daughter forgot to pick up antibiotic she says she "will pick it up tomorrow, Lidoderm patches were not approved by insurance but she doesn't really need them"   Allergies reviewed: NO  Dietary changes reviewed: NO  Referrals reviewed: YES - [facility] will be coming to visit tomorrow     Functional Questionnaire:   Activities of Daily Living (ADLs):   She states they are independent in the following:  States they require assistance with the following:    Any transportation issues/concerns?: NO   Any patient concerns? NO   Confirmed importance and date/time of follow-up visits scheduled: YES - Dec 2, 15 @ 12:30   Confirmed with patient if condition begins to worsen call PCP or go to the ER.  Patient was given the Call-a-Nurse line [phone]: YES

## 2014-08-10 NOTE — Telephone Encounter (Signed)
I can see her on 08/12/14 at 12:30.  Does Heidi [name] know about hospitalization?  Thanks.

## 2014-08-10 NOTE — Telephone Encounter (Signed)
Pt needs a follow up visit. Pt fell on the weekend and fx ribs on the right side. Pt was admitted on Saturday and discharged on Sunday afternoon. Pt was given a rx for Lidoderm patch but the insurance company without a doctors referral.Please advise where to add to schedule.msn

## 2014-08-10 NOTE — Telephone Encounter (Signed)
Yes, Almyra Free aware of discharge & appt.

## 2014-08-10 NOTE — Telephone Encounter (Signed)
Please advise on date & time [facility] records & discharge summary requested via fax)

## 2014-08-11 ENCOUNTER — Other Ambulatory Visit: Payer: Self-pay | Admitting: *Deleted

## 2014-08-11 MED ORDER — AMLODIPINE BESYLATE 5 MG PO TABS: 5.0000 mg | ORAL_TABLET | Freq: Every day | ORAL | Status: DC

## 2014-08-12 ENCOUNTER — Encounter (INDEPENDENT_AMBULATORY_CARE_PROVIDER_SITE_OTHER): Payer: Self-pay

## 2014-08-12 ENCOUNTER — Encounter: Payer: Self-pay | Admitting: Internal Medicine

## 2014-08-12 ENCOUNTER — Ambulatory Visit (INDEPENDENT_AMBULATORY_CARE_PROVIDER_SITE_OTHER): Payer: Medicare Other | Admitting: Internal Medicine

## 2014-08-12 VITALS — BP 130/60 | HR 67 | Temp 98.0°F | Ht 59.0 in | Wt 112.5 lb

## 2014-08-12 DIAGNOSIS — I10 Essential (primary) hypertension: Secondary | ICD-10-CM

## 2014-08-12 DIAGNOSIS — M19019 Primary osteoarthritis, unspecified shoulder: Secondary | ICD-10-CM

## 2014-08-12 DIAGNOSIS — R0781 Pleurodynia: Secondary | ICD-10-CM

## 2014-08-12 DIAGNOSIS — K219 Gastro-esophageal reflux disease without esophagitis: Secondary | ICD-10-CM

## 2014-08-12 DIAGNOSIS — M25519 Pain in unspecified shoulder: Secondary | ICD-10-CM

## 2014-08-12 DIAGNOSIS — M1990 Unspecified osteoarthritis, unspecified site: Secondary | ICD-10-CM

## 2014-08-12 NOTE — Progress Notes (Signed)
Pre visit review using our clinic review tool, if applicable. No additional management support is needed unless otherwise documented below in the visit note. 

## 2014-08-14 ENCOUNTER — Telehealth: Payer: Self-pay | Admitting: Internal Medicine

## 2014-08-14 DIAGNOSIS — R531 Weakness: Secondary | ICD-10-CM

## 2014-08-14 DIAGNOSIS — R0781 Pleurodynia: Secondary | ICD-10-CM

## 2014-08-14 DIAGNOSIS — S2241XA Multiple fractures of ribs, right side, initial encounter for closed fracture: Secondary | ICD-10-CM

## 2014-08-14 NOTE — Telephone Encounter (Signed)
The patient is needing a referral for home health with [facility].

## 2014-08-14 NOTE — Telephone Encounter (Signed)
I have placed the order.  Can you please call [facility].  (there is a lady that lives in her neighborhood that works for [employer] and is willing to start).  Just need order sent in.  Thanks.

## 2014-08-14 NOTE — Telephone Encounter (Signed)
Order placed for home health referral.

## 2014-08-15 DIAGNOSIS — M47812 Spondylosis without myelopathy or radiculopathy, cervical region: Secondary | ICD-10-CM | POA: Diagnosis not present

## 2014-08-15 DIAGNOSIS — Z96652 Presence of left artificial knee joint: Secondary | ICD-10-CM | POA: Diagnosis not present

## 2014-08-15 DIAGNOSIS — N390 Urinary tract infection, site not specified: Secondary | ICD-10-CM | POA: Diagnosis not present

## 2014-08-15 DIAGNOSIS — M19012 Primary osteoarthritis, left shoulder: Secondary | ICD-10-CM | POA: Diagnosis not present

## 2014-08-15 DIAGNOSIS — M180 Bilateral primary osteoarthritis of first carpometacarpal joints: Secondary | ICD-10-CM | POA: Diagnosis not present

## 2014-08-15 DIAGNOSIS — M19011 Primary osteoarthritis, right shoulder: Secondary | ICD-10-CM | POA: Diagnosis not present

## 2014-08-15 DIAGNOSIS — Z9181 History of falling: Secondary | ICD-10-CM | POA: Diagnosis not present

## 2014-08-15 DIAGNOSIS — M75102 Unspecified rotator cuff tear or rupture of left shoulder, not specified as traumatic: Secondary | ICD-10-CM | POA: Diagnosis not present

## 2014-08-15 DIAGNOSIS — R2689 Other abnormalities of gait and mobility: Secondary | ICD-10-CM | POA: Diagnosis not present

## 2014-08-15 DIAGNOSIS — J45909 Unspecified asthma, uncomplicated: Secondary | ICD-10-CM | POA: Diagnosis not present

## 2014-08-15 DIAGNOSIS — M75101 Unspecified rotator cuff tear or rupture of right shoulder, not specified as traumatic: Secondary | ICD-10-CM | POA: Diagnosis not present

## 2014-08-15 DIAGNOSIS — D649 Anemia, unspecified: Secondary | ICD-10-CM | POA: Diagnosis not present

## 2014-08-15 DIAGNOSIS — M064 Inflammatory polyarthropathy: Secondary | ICD-10-CM | POA: Diagnosis not present

## 2014-08-15 DIAGNOSIS — M8468XD Pathological fracture in other disease, other site, subsequent encounter for fracture with routine healing: Secondary | ICD-10-CM | POA: Diagnosis not present

## 2014-08-15 DIAGNOSIS — M810 Age-related osteoporosis without current pathological fracture: Secondary | ICD-10-CM | POA: Diagnosis not present

## 2014-08-15 DIAGNOSIS — I10 Essential (primary) hypertension: Secondary | ICD-10-CM | POA: Diagnosis not present

## 2014-08-16 ENCOUNTER — Encounter: Payer: Self-pay | Admitting: Internal Medicine

## 2014-08-16 DIAGNOSIS — R0781 Pleurodynia: Secondary | ICD-10-CM | POA: Insufficient documentation

## 2014-08-16 DIAGNOSIS — M25519 Pain in unspecified shoulder: Secondary | ICD-10-CM | POA: Insufficient documentation

## 2014-08-16 MED ORDER — LIDOCAINE 5 % EX PTCH: 1.0000 | MEDICATED_PATCH | CUTANEOUS | Status: DC

## 2014-08-16 NOTE — Progress Notes (Signed)
Subjective:    Patient ID: Heidi [name], female    DOB: 1917-11-27, 80 y.o.   MRN: [id number]  HPI 80 year old female with past history of nephrolithiasis, hypertension, GERD, osteoarthritis and osteoporosis who comes in today for a hospital follow up.   She is accompanied by her daughter.  History obtained from both of them.  She fell recently.  Her side hit the corner or a door frame.  She was hospitalized 11/28/15011/29/15.  Had CT head that revealed no acute intracranial abnormality.  CT spine - no acute fracture or malaliignment.  Did have right rib fractures.  Pain was controlled with Tylox and lidicaine patch (in hospital).  Still with increased pain, especially with certain movements.  Insurance will not cover the lidocaine patch.  Daughter feels the tylox is too strong. Would like to avoid narcotic pain medications.  Unable to take antiinflammatories.   Taking tylenol.  Has had shoulder pain.  Has seen Dr Jefm Bryant.  Had an injection.  Did not make a big difference.  Has very limited rom.  It is hard for her to feed herself and fix her hair and get dressed.  She saw Dr Jefm Bryant in f/u regarding her shoulder.  He did not feel there was anything more that could be done.  Needs home health arranged.  Using the incentive spirometer.     Past Medical History  Diagnosis Date  . Anemia   . Hypertension   . Osteoporosis     vitamin D deficiency, nasal miacalcin, reclast, previous rib fracture  . Asthma   . GERD (gastroesophageal reflux disease)   . Nephrolithiasis   . OA (osteoarthritis)     Left knee replacement, hands, shoulders, cervial spine  . Vitamin D deficiency   . Inflammatory arthritis     elevated ESR, negative temporal artery bx, low titer rheumatoid factor  . Pancreatitis     s/p cholecystectomy with ERCP and stone extraction    Current Outpatient Prescriptions on File Prior to Visit  Medication Sig Dispense Refill  . acetaminophen (TYLENOL) 650 MG CR tablet Take 1,300  mg by mouth 2 (two) times daily.     Marland Kitchen amLODipine (NORVASC) 5 MG tablet Take 1 tablet (5 mg total) by mouth daily. 90 tablet 1  . Calcium Carbonate-Vitamin D (CALCIUM 600+D) 600-200 MG-UNIT TABS Take 1 tablet by mouth 2 (two) times daily.    . Cholecalciferol (VITAMIN D) 2000 UNITS tablet Take 2,000 Units by mouth daily.    . fluticasone (FLONASE) 50 MCG/ACT nasal spray Place 2 sprays into both nostrils daily. 16 g 1  . metoprolol succinate (TOPROL-XL) 25 MG 24 hr tablet Take 0.5 tablets (12.5 mg total) by mouth daily. 15 tablet 5  . montelukast (SINGULAIR) 10 MG tablet Take 1 tablet (10 mg total) by mouth daily. 30 tablet 11  . omeprazole (PRILOSEC) 20 MG capsule Take 1 capsule (20 mg total) by mouth daily. 90 capsule 1  . thiamine (VITAMIN B-1) 100 MG tablet Take 100 mg by mouth daily.    . vitamin C (ASCORBIC ACID) 500 MG tablet Take 500 mg by mouth daily.    . vitamin E 400 UNIT capsule Take 400 Units by mouth daily.     No current facility-administered medications on file prior to visit.    Review of Systems No headache reported today.  No increased sinus or allergy symptoms.   No chest pain, tightness or palpitations.  No increased shortness of breath.  Does report the increased  right rib pain.  Hurts especially with certain movements.  still with shoulder pain as outlined.  Breathing stable.  No nausea or vomiting.  No acid reflux, dysphagia or odynophagia.  Acid controlled has been controlled on Omeprazole.  No abdominal pain or cramping.      Objective:   Physical Exam  Filed Vitals:   08/12/14 1217  BP: 130/60  Pulse: 67  Temp: 98 F (16.63 C)   80 year old female in no acute distress.   HEENT:  Nares- clear.  Oropharynx - without lesions.   NECK:  Supple.  Nontender.  No audible bruit.  HEART:  Appears to be regular. LUNGS:  No crackles or wheezing audible.  Respirations even and unlabored.  Good breath sounds bilaterally.   RADIAL PULSE:  Equal bilaterally.   RIBS:   Increased pain over the ant/lateral right mid ribs.     ABDOMEN:  Soft, nontender.  Bowel sounds present and normal.  No audible abdominal bruit.      EXTREMITIES:  No increased edema present.       MSK:  Shoulder pain and limited rom with attempts at raising her arm - especially approaching 90 degrees.         Assessment & Plan:  1. Essential hypertension Blood pressure doing well.    2. Primary osteoarthritis of shoulder, unspecified laterality See above.  Still with increased pain and limited rom.  Physical therapy.    3. Inflammatory arthritis Stable.  Sees Dr Jefm Bryant.    4. Gastroesophageal reflux disease, esophagitis presence not specified Controlled on omeprazole.    5. Rib pain on right side S/p fall.  Rib fractures.  Continue incentive spirometer.  Will need home health physical therapy.  [facility].  Will see if we can get prior authorization for lidocaine patches.  Does not do well with narcotic pain medication.  Unable to take antiinflammatories.  Concern about side effects of ultram - dizziness.  Tolerated the lidoderm patch in the hospital.    6. Shoulder pain, unspecified laterality See above.  S/p injection.  Seeing Dr Jefm Bryant.  Limited rom.    I spent 25 minutes with the patient and more than 50% of the time was spent in consultation regarding the above.

## 2014-08-17 ENCOUNTER — Telehealth: Payer: Self-pay | Admitting: *Deleted

## 2014-08-17 DIAGNOSIS — R2689 Other abnormalities of gait and mobility: Secondary | ICD-10-CM | POA: Diagnosis not present

## 2014-08-17 DIAGNOSIS — M47812 Spondylosis without myelopathy or radiculopathy, cervical region: Secondary | ICD-10-CM | POA: Diagnosis not present

## 2014-08-17 DIAGNOSIS — M810 Age-related osteoporosis without current pathological fracture: Secondary | ICD-10-CM | POA: Diagnosis not present

## 2014-08-17 DIAGNOSIS — N390 Urinary tract infection, site not specified: Secondary | ICD-10-CM | POA: Diagnosis not present

## 2014-08-17 DIAGNOSIS — M8468XD Pathological fracture in other disease, other site, subsequent encounter for fracture with routine healing: Secondary | ICD-10-CM | POA: Diagnosis not present

## 2014-08-17 DIAGNOSIS — I10 Essential (primary) hypertension: Secondary | ICD-10-CM | POA: Diagnosis not present

## 2014-08-17 NOTE — Telephone Encounter (Signed)
Fax from pharmacy, needing PA for lidocaine patches. Started online, pending response.

## 2014-08-19 DIAGNOSIS — N390 Urinary tract infection, site not specified: Secondary | ICD-10-CM | POA: Diagnosis not present

## 2014-08-19 DIAGNOSIS — M47812 Spondylosis without myelopathy or radiculopathy, cervical region: Secondary | ICD-10-CM | POA: Diagnosis not present

## 2014-08-19 DIAGNOSIS — M810 Age-related osteoporosis without current pathological fracture: Secondary | ICD-10-CM | POA: Diagnosis not present

## 2014-08-19 DIAGNOSIS — R2689 Other abnormalities of gait and mobility: Secondary | ICD-10-CM | POA: Diagnosis not present

## 2014-08-19 DIAGNOSIS — M8468XD Pathological fracture in other disease, other site, subsequent encounter for fracture with routine healing: Secondary | ICD-10-CM | POA: Diagnosis not present

## 2014-08-19 DIAGNOSIS — I10 Essential (primary) hypertension: Secondary | ICD-10-CM | POA: Diagnosis not present

## 2014-08-20 DIAGNOSIS — M810 Age-related osteoporosis without current pathological fracture: Secondary | ICD-10-CM | POA: Diagnosis not present

## 2014-08-20 DIAGNOSIS — N390 Urinary tract infection, site not specified: Secondary | ICD-10-CM | POA: Diagnosis not present

## 2014-08-20 DIAGNOSIS — R2689 Other abnormalities of gait and mobility: Secondary | ICD-10-CM | POA: Diagnosis not present

## 2014-08-20 DIAGNOSIS — M47812 Spondylosis without myelopathy or radiculopathy, cervical region: Secondary | ICD-10-CM | POA: Diagnosis not present

## 2014-08-20 DIAGNOSIS — M8468XD Pathological fracture in other disease, other site, subsequent encounter for fracture with routine healing: Secondary | ICD-10-CM | POA: Diagnosis not present

## 2014-08-20 DIAGNOSIS — I10 Essential (primary) hypertension: Secondary | ICD-10-CM | POA: Diagnosis not present

## 2014-08-21 DIAGNOSIS — M810 Age-related osteoporosis without current pathological fracture: Secondary | ICD-10-CM | POA: Diagnosis not present

## 2014-08-21 DIAGNOSIS — I10 Essential (primary) hypertension: Secondary | ICD-10-CM | POA: Diagnosis not present

## 2014-08-21 DIAGNOSIS — M47812 Spondylosis without myelopathy or radiculopathy, cervical region: Secondary | ICD-10-CM | POA: Diagnosis not present

## 2014-08-21 DIAGNOSIS — N390 Urinary tract infection, site not specified: Secondary | ICD-10-CM | POA: Diagnosis not present

## 2014-08-21 DIAGNOSIS — R2689 Other abnormalities of gait and mobility: Secondary | ICD-10-CM | POA: Diagnosis not present

## 2014-08-21 DIAGNOSIS — M8468XD Pathological fracture in other disease, other site, subsequent encounter for fracture with routine healing: Secondary | ICD-10-CM | POA: Diagnosis not present

## 2014-08-25 ENCOUNTER — Ambulatory Visit: Payer: Medicare Other | Admitting: Internal Medicine

## 2014-08-25 DIAGNOSIS — N390 Urinary tract infection, site not specified: Secondary | ICD-10-CM | POA: Diagnosis not present

## 2014-08-25 DIAGNOSIS — R2689 Other abnormalities of gait and mobility: Secondary | ICD-10-CM | POA: Diagnosis not present

## 2014-08-25 DIAGNOSIS — M8468XD Pathological fracture in other disease, other site, subsequent encounter for fracture with routine healing: Secondary | ICD-10-CM | POA: Diagnosis not present

## 2014-08-25 DIAGNOSIS — I10 Essential (primary) hypertension: Secondary | ICD-10-CM | POA: Diagnosis not present

## 2014-08-25 DIAGNOSIS — M810 Age-related osteoporosis without current pathological fracture: Secondary | ICD-10-CM | POA: Diagnosis not present

## 2014-08-25 DIAGNOSIS — M47812 Spondylosis without myelopathy or radiculopathy, cervical region: Secondary | ICD-10-CM | POA: Diagnosis not present

## 2014-08-26 DIAGNOSIS — M810 Age-related osteoporosis without current pathological fracture: Secondary | ICD-10-CM | POA: Diagnosis not present

## 2014-08-26 DIAGNOSIS — R2689 Other abnormalities of gait and mobility: Secondary | ICD-10-CM | POA: Diagnosis not present

## 2014-08-26 DIAGNOSIS — M8468XD Pathological fracture in other disease, other site, subsequent encounter for fracture with routine healing: Secondary | ICD-10-CM | POA: Diagnosis not present

## 2014-08-26 DIAGNOSIS — I10 Essential (primary) hypertension: Secondary | ICD-10-CM | POA: Diagnosis not present

## 2014-08-26 DIAGNOSIS — N390 Urinary tract infection, site not specified: Secondary | ICD-10-CM | POA: Diagnosis not present

## 2014-08-26 DIAGNOSIS — M47812 Spondylosis without myelopathy or radiculopathy, cervical region: Secondary | ICD-10-CM | POA: Diagnosis not present

## 2014-08-27 DIAGNOSIS — N390 Urinary tract infection, site not specified: Secondary | ICD-10-CM | POA: Diagnosis not present

## 2014-08-27 DIAGNOSIS — I10 Essential (primary) hypertension: Secondary | ICD-10-CM | POA: Diagnosis not present

## 2014-08-27 DIAGNOSIS — M47812 Spondylosis without myelopathy or radiculopathy, cervical region: Secondary | ICD-10-CM | POA: Diagnosis not present

## 2014-08-27 DIAGNOSIS — M810 Age-related osteoporosis without current pathological fracture: Secondary | ICD-10-CM | POA: Diagnosis not present

## 2014-08-27 DIAGNOSIS — M8468XD Pathological fracture in other disease, other site, subsequent encounter for fracture with routine healing: Secondary | ICD-10-CM | POA: Diagnosis not present

## 2014-08-27 DIAGNOSIS — R2689 Other abnormalities of gait and mobility: Secondary | ICD-10-CM | POA: Diagnosis not present

## 2014-08-28 ENCOUNTER — Telehealth: Payer: Self-pay | Admitting: *Deleted

## 2014-08-28 NOTE — Telephone Encounter (Signed)
Monique called requesting a verbal order for OT to eval & treat for adaptive equipment & ADL. Please advise.

## 2014-08-28 NOTE — Telephone Encounter (Signed)
Ok

## 2014-08-28 NOTE — Telephone Encounter (Signed)
Heidi [name] given verbal order

## 2014-08-31 DIAGNOSIS — M810 Age-related osteoporosis without current pathological fracture: Secondary | ICD-10-CM | POA: Diagnosis not present

## 2014-08-31 DIAGNOSIS — M8468XD Pathological fracture in other disease, other site, subsequent encounter for fracture with routine healing: Secondary | ICD-10-CM

## 2014-08-31 DIAGNOSIS — R2689 Other abnormalities of gait and mobility: Secondary | ICD-10-CM

## 2014-08-31 DIAGNOSIS — N390 Urinary tract infection, site not specified: Secondary | ICD-10-CM | POA: Diagnosis not present

## 2014-09-01 DIAGNOSIS — I10 Essential (primary) hypertension: Secondary | ICD-10-CM | POA: Diagnosis not present

## 2014-09-01 DIAGNOSIS — M8468XD Pathological fracture in other disease, other site, subsequent encounter for fracture with routine healing: Secondary | ICD-10-CM | POA: Diagnosis not present

## 2014-09-01 DIAGNOSIS — M47812 Spondylosis without myelopathy or radiculopathy, cervical region: Secondary | ICD-10-CM | POA: Diagnosis not present

## 2014-09-01 DIAGNOSIS — R2689 Other abnormalities of gait and mobility: Secondary | ICD-10-CM | POA: Diagnosis not present

## 2014-09-01 DIAGNOSIS — N390 Urinary tract infection, site not specified: Secondary | ICD-10-CM | POA: Diagnosis not present

## 2014-09-01 DIAGNOSIS — M810 Age-related osteoporosis without current pathological fracture: Secondary | ICD-10-CM | POA: Diagnosis not present

## 2014-09-02 DIAGNOSIS — R2689 Other abnormalities of gait and mobility: Secondary | ICD-10-CM | POA: Diagnosis not present

## 2014-09-02 DIAGNOSIS — N390 Urinary tract infection, site not specified: Secondary | ICD-10-CM | POA: Diagnosis not present

## 2014-09-02 DIAGNOSIS — I10 Essential (primary) hypertension: Secondary | ICD-10-CM | POA: Diagnosis not present

## 2014-09-02 DIAGNOSIS — M8468XD Pathological fracture in other disease, other site, subsequent encounter for fracture with routine healing: Secondary | ICD-10-CM | POA: Diagnosis not present

## 2014-09-02 DIAGNOSIS — M47812 Spondylosis without myelopathy or radiculopathy, cervical region: Secondary | ICD-10-CM | POA: Diagnosis not present

## 2014-09-02 DIAGNOSIS — M810 Age-related osteoporosis without current pathological fracture: Secondary | ICD-10-CM | POA: Diagnosis not present

## 2014-09-07 DIAGNOSIS — M064 Inflammatory polyarthropathy: Secondary | ICD-10-CM | POA: Diagnosis not present

## 2014-09-07 DIAGNOSIS — M25519 Pain in unspecified shoulder: Secondary | ICD-10-CM

## 2014-09-07 DIAGNOSIS — I10 Essential (primary) hypertension: Secondary | ICD-10-CM

## 2014-09-07 DIAGNOSIS — R0781 Pleurodynia: Secondary | ICD-10-CM

## 2014-09-07 DIAGNOSIS — M19019 Primary osteoarthritis, unspecified shoulder: Secondary | ICD-10-CM

## 2014-09-07 DIAGNOSIS — K219 Gastro-esophageal reflux disease without esophagitis: Secondary | ICD-10-CM | POA: Diagnosis not present

## 2014-09-08 ENCOUNTER — Other Ambulatory Visit: Payer: Self-pay

## 2014-09-08 DIAGNOSIS — M47812 Spondylosis without myelopathy or radiculopathy, cervical region: Secondary | ICD-10-CM | POA: Diagnosis not present

## 2014-09-08 DIAGNOSIS — R2689 Other abnormalities of gait and mobility: Secondary | ICD-10-CM | POA: Diagnosis not present

## 2014-09-08 DIAGNOSIS — M810 Age-related osteoporosis without current pathological fracture: Secondary | ICD-10-CM | POA: Diagnosis not present

## 2014-09-08 DIAGNOSIS — M8468XD Pathological fracture in other disease, other site, subsequent encounter for fracture with routine healing: Secondary | ICD-10-CM | POA: Diagnosis not present

## 2014-09-08 DIAGNOSIS — N390 Urinary tract infection, site not specified: Secondary | ICD-10-CM | POA: Diagnosis not present

## 2014-09-08 DIAGNOSIS — I10 Essential (primary) hypertension: Secondary | ICD-10-CM | POA: Diagnosis not present

## 2014-09-08 MED ORDER — METOPROLOL SUCCINATE ER 25 MG PO TB24: 12.5000 mg | ORAL_TABLET | Freq: Every day | ORAL | Status: DC

## 2014-09-10 DIAGNOSIS — M810 Age-related osteoporosis without current pathological fracture: Secondary | ICD-10-CM | POA: Diagnosis not present

## 2014-09-10 DIAGNOSIS — R2689 Other abnormalities of gait and mobility: Secondary | ICD-10-CM | POA: Diagnosis not present

## 2014-09-10 DIAGNOSIS — I10 Essential (primary) hypertension: Secondary | ICD-10-CM | POA: Diagnosis not present

## 2014-09-10 DIAGNOSIS — M8468XD Pathological fracture in other disease, other site, subsequent encounter for fracture with routine healing: Secondary | ICD-10-CM | POA: Diagnosis not present

## 2014-09-10 DIAGNOSIS — N390 Urinary tract infection, site not specified: Secondary | ICD-10-CM | POA: Diagnosis not present

## 2014-09-10 DIAGNOSIS — M47812 Spondylosis without myelopathy or radiculopathy, cervical region: Secondary | ICD-10-CM | POA: Diagnosis not present

## 2014-09-11 DIAGNOSIS — I10 Essential (primary) hypertension: Secondary | ICD-10-CM | POA: Diagnosis not present

## 2014-09-11 DIAGNOSIS — M8468XD Pathological fracture in other disease, other site, subsequent encounter for fracture with routine healing: Secondary | ICD-10-CM | POA: Diagnosis not present

## 2014-09-11 DIAGNOSIS — R2689 Other abnormalities of gait and mobility: Secondary | ICD-10-CM | POA: Diagnosis not present

## 2014-09-11 DIAGNOSIS — M810 Age-related osteoporosis without current pathological fracture: Secondary | ICD-10-CM | POA: Diagnosis not present

## 2014-09-11 DIAGNOSIS — N390 Urinary tract infection, site not specified: Secondary | ICD-10-CM | POA: Diagnosis not present

## 2014-09-11 DIAGNOSIS — M47812 Spondylosis without myelopathy or radiculopathy, cervical region: Secondary | ICD-10-CM | POA: Diagnosis not present

## 2014-09-15 DIAGNOSIS — M8468XD Pathological fracture in other disease, other site, subsequent encounter for fracture with routine healing: Secondary | ICD-10-CM | POA: Diagnosis not present

## 2014-09-15 DIAGNOSIS — M810 Age-related osteoporosis without current pathological fracture: Secondary | ICD-10-CM | POA: Diagnosis not present

## 2014-09-15 DIAGNOSIS — N390 Urinary tract infection, site not specified: Secondary | ICD-10-CM | POA: Diagnosis not present

## 2014-09-15 DIAGNOSIS — M47812 Spondylosis without myelopathy or radiculopathy, cervical region: Secondary | ICD-10-CM | POA: Diagnosis not present

## 2014-09-15 DIAGNOSIS — R2689 Other abnormalities of gait and mobility: Secondary | ICD-10-CM | POA: Diagnosis not present

## 2014-09-15 DIAGNOSIS — I10 Essential (primary) hypertension: Secondary | ICD-10-CM | POA: Diagnosis not present

## 2014-09-17 DIAGNOSIS — N390 Urinary tract infection, site not specified: Secondary | ICD-10-CM | POA: Diagnosis not present

## 2014-09-17 DIAGNOSIS — R2689 Other abnormalities of gait and mobility: Secondary | ICD-10-CM | POA: Diagnosis not present

## 2014-09-17 DIAGNOSIS — M810 Age-related osteoporosis without current pathological fracture: Secondary | ICD-10-CM | POA: Diagnosis not present

## 2014-09-17 DIAGNOSIS — I10 Essential (primary) hypertension: Secondary | ICD-10-CM | POA: Diagnosis not present

## 2014-09-17 DIAGNOSIS — M47812 Spondylosis without myelopathy or radiculopathy, cervical region: Secondary | ICD-10-CM | POA: Diagnosis not present

## 2014-09-17 DIAGNOSIS — M8468XD Pathological fracture in other disease, other site, subsequent encounter for fracture with routine healing: Secondary | ICD-10-CM | POA: Diagnosis not present

## 2014-09-23 ENCOUNTER — Encounter: Payer: Self-pay | Admitting: Internal Medicine

## 2014-09-23 ENCOUNTER — Ambulatory Visit (INDEPENDENT_AMBULATORY_CARE_PROVIDER_SITE_OTHER): Payer: Medicare Other | Admitting: Internal Medicine

## 2014-09-23 VITALS — BP 120/70 | HR 82 | Temp 97.8°F | Ht 59.0 in | Wt 108.0 lb

## 2014-09-23 DIAGNOSIS — M25519 Pain in unspecified shoulder: Secondary | ICD-10-CM

## 2014-09-23 DIAGNOSIS — R51 Headache: Secondary | ICD-10-CM

## 2014-09-23 DIAGNOSIS — R600 Localized edema: Secondary | ICD-10-CM | POA: Diagnosis not present

## 2014-09-23 DIAGNOSIS — I10 Essential (primary) hypertension: Secondary | ICD-10-CM | POA: Diagnosis not present

## 2014-09-23 DIAGNOSIS — R42 Dizziness and giddiness: Secondary | ICD-10-CM

## 2014-09-23 DIAGNOSIS — M064 Inflammatory polyarthropathy: Secondary | ICD-10-CM | POA: Diagnosis not present

## 2014-09-23 DIAGNOSIS — K219 Gastro-esophageal reflux disease without esophagitis: Secondary | ICD-10-CM | POA: Diagnosis not present

## 2014-09-23 DIAGNOSIS — R519 Headache, unspecified: Secondary | ICD-10-CM

## 2014-09-23 DIAGNOSIS — D649 Anemia, unspecified: Secondary | ICD-10-CM | POA: Diagnosis not present

## 2014-09-23 DIAGNOSIS — M1990 Unspecified osteoarthritis, unspecified site: Secondary | ICD-10-CM

## 2014-09-23 NOTE — Progress Notes (Signed)
Pre visit review using our clinic review tool, if applicable. No additional management support is needed unless otherwise documented below in the visit note. 

## 2014-09-27 ENCOUNTER — Encounter: Payer: Self-pay | Admitting: Internal Medicine

## 2014-09-27 NOTE — Progress Notes (Signed)
Subjective:    Patient ID: Heidi [name], female    DOB: [birth year], 81 y.o.   MRN: [id number]  HPI 81 year old female with past history of nephrolithiasis, hypertension, GERD, osteoarthritis and osteoporosis who comes in today for a scheduled follow up.  She is accompanied by her daughter.  History obtained from both of them.  She was hospitalized 11/28/15011/29/15.  Had CT head that revealed no acute intracranial abnormality.  CT spine - no acute fracture or malaliignment.  Did have right rib fractures.  Pain was controlled with Tylox and lidicaine patch (in hospital).  Rib pain resolved.   Has had shoulder pain.  Has seen Dr Jefm Bryant.  Had an injection.  Did not make a big difference.  Has very limited rom.  It is hard for her to feed herself and fix her hair and get dressed.  She saw Dr Jefm Bryant in f/u regarding her shoulder.  He did not feel there was anything more that could be done.  She has been having trouble with intermittent dizziness.  Has occurred 4-5x over the last two weeks.  She has a walker at home.  We discussed the need to use this regularly.  The episodes seem to get worse with movement or standing.  She still reports the pressure sensation behind her eyes.  This has been present for a while.  Appears to be stable.  No increased headache.  No increased sinus issues.      Past Medical History  Diagnosis Date  . Anemia   . Hypertension   . Osteoporosis     vitamin D deficiency, nasal miacalcin, reclast, previous rib fracture  . Asthma   . GERD (gastroesophageal reflux disease)   . Nephrolithiasis   . OA (osteoarthritis)     Left knee replacement, hands, shoulders, cervial spine  . Vitamin D deficiency   . Inflammatory arthritis     elevated ESR, negative temporal artery bx, low titer rheumatoid factor  . Pancreatitis     s/p cholecystectomy with ERCP and stone extraction    Current Outpatient Prescriptions on File Prior to Visit  Medication Sig Dispense Refill  .  acetaminophen (TYLENOL) 650 MG CR tablet Take 1,300 mg by mouth 2 (two) times daily.     Marland Kitchen amLODipine (NORVASC) 5 MG tablet Take 1 tablet (5 mg total) by mouth daily. 90 tablet 1  . Calcium Carbonate-Vitamin D (CALCIUM 600+D) 600-200 MG-UNIT TABS Take 1 tablet by mouth 2 (two) times daily.    . Cholecalciferol (VITAMIN D) 2000 UNITS tablet Take 2,000 Units by mouth daily.    . metoprolol succinate (TOPROL-XL) 25 MG 24 hr tablet Take 0.5 tablets (12.5 mg total) by mouth daily. 15 tablet 5  . montelukast (SINGULAIR) 10 MG tablet Take 1 tablet (10 mg total) by mouth daily. 30 tablet 11  . thiamine (VITAMIN B-1) 100 MG tablet Take 100 mg by mouth daily.    . vitamin C (ASCORBIC ACID) 500 MG tablet Take 500 mg by mouth daily.    . vitamin E 400 UNIT capsule Take 400 Units by mouth daily.     No current facility-administered medications on file prior to visit.    Review of Systems No headache reported today.  Pressure as outlined.  Intermittent dizziness as outlined.  No increased sinus or allergy symptoms.   No chest pain, tightness or palpitations.  No increased shortness of breath.  Rib pain better.  Still with shoulder pain as outlined.  Breathing stable.  No  nausea or vomiting.  No acid reflux, dysphagia or odynophagia.  Acid controlled has been controlled on Omeprazole.  No abdominal pain or cramping.  She was questioning if her blood pressure could be elevated.       Objective:   Physical Exam  Filed Vitals:   09/23/14 1156  BP: 120/70  Pulse: 82  Temp: 97.8 F (36.6 C)   Blood pressure recheck:  63/64  81 year old female in no acute distress.   HEENT:  Nares- clear.  Oropharynx - without lesions.   NECK:  Supple.  Nontender.  No audible bruit.  HEART:  Appears to be regular. LUNGS:  No crackles or wheezing audible.  Respirations even and unlabored.  Good breath sounds bilaterally.   RADIAL PULSE:  Equal bilaterally.   RIBS:  No pain.      ABDOMEN:  Soft, nontender.  Bowel  sounds present and normal.  No audible abdominal bruit.      EXTREMITIES:  No increased edema present.       MSK:  Shoulder pain and limited rom with attempts at raising her arm - especially approaching 90 degrees.         Assessment & Plan:  1. Essential hypertension Blood pressure appears to be doing well.  Follow.  Same medcation regimen.  Not orthostatic today.  Follow.    2. Gastroesophageal reflux disease, esophagitis presence not specified Controlled.    3. Inflammatory arthritis Stable.    4. Shoulder pain, unspecified laterality Persistent.  Limited rom.  Has seen Dr Carleene Cooper.  See above.    5. Edema of lower extremity, unspecified laterality Not an issue now.  Follow.  Compression hose.    6. Dizziness Persistent intermittent episodes.  See above.  Unclear etiology.  Not orthostatic on exam.  Pulse wnl.  Discussed further evaluation.  Discussed scan.  Start ECASA 4m q day.  Check carotid ultrasound.  Follow.  Stay hydrated.  Use her walker.    7. Anemia, unspecified anemia type Hgb has been stable.  Follow cbc.    8. Headache, unspecified headache type Appears to be stable.     I spent 25 minutes with the patient and more than 50% of the time was spent in consultation regarding the above.

## 2014-09-28 ENCOUNTER — Telehealth: Payer: Self-pay | Admitting: Internal Medicine

## 2014-10-09 DIAGNOSIS — I6529 Occlusion and stenosis of unspecified carotid artery: Secondary | ICD-10-CM | POA: Diagnosis not present

## 2014-10-15 ENCOUNTER — Telehealth: Payer: Self-pay | Admitting: Internal Medicine

## 2014-10-15 NOTE — Telephone Encounter (Signed)
Please call pt and notify her that her carotid ultrasound reveals no significant blockage.

## 2014-10-15 NOTE — Telephone Encounter (Signed)
Called and advised patient of results

## 2014-11-23 ENCOUNTER — Ambulatory Visit (INDEPENDENT_AMBULATORY_CARE_PROVIDER_SITE_OTHER): Payer: Medicare Other | Admitting: Internal Medicine

## 2014-11-23 ENCOUNTER — Encounter: Payer: Self-pay | Admitting: Internal Medicine

## 2014-11-23 VITALS — BP 156/72 | HR 65 | Temp 97.9°F | Ht 59.0 in | Wt 111.0 lb

## 2014-11-23 DIAGNOSIS — K219 Gastro-esophageal reflux disease without esophagitis: Secondary | ICD-10-CM

## 2014-11-23 DIAGNOSIS — M1380 Other specified arthritis, unspecified site: Secondary | ICD-10-CM

## 2014-11-23 DIAGNOSIS — I10 Essential (primary) hypertension: Secondary | ICD-10-CM | POA: Diagnosis not present

## 2014-11-23 DIAGNOSIS — M25519 Pain in unspecified shoulder: Secondary | ICD-10-CM | POA: Diagnosis not present

## 2014-11-23 DIAGNOSIS — D649 Anemia, unspecified: Secondary | ICD-10-CM

## 2014-11-23 DIAGNOSIS — M810 Age-related osteoporosis without current pathological fracture: Secondary | ICD-10-CM

## 2014-11-23 DIAGNOSIS — L84 Corns and callosities: Secondary | ICD-10-CM | POA: Diagnosis not present

## 2014-11-23 DIAGNOSIS — M064 Inflammatory polyarthropathy: Secondary | ICD-10-CM

## 2014-11-23 DIAGNOSIS — M1990 Unspecified osteoarthritis, unspecified site: Secondary | ICD-10-CM

## 2014-11-23 DIAGNOSIS — R42 Dizziness and giddiness: Secondary | ICD-10-CM | POA: Diagnosis not present

## 2014-11-23 NOTE — Progress Notes (Signed)
Patient ID: Heidi [name], female   DOB: 1918-09-09, 81 y.o.   MRN: [id number]   Subjective:    Patient ID: Heidi [name], female    DOB: 1918-06-03, 81 y.o.   MRN: [id number]  HPI  Patient here for a scheduled follow up.  She is accompanied by her daugther.  History obtained from both of them.  States she is doing well.  The light headedness is better.  No significant dizziness.  No headache.  No cardiac symptoms with increased activity or exertion.  Breathing stable.  Bowels stable.     Past Medical History  Diagnosis Date  . Anemia   . Hypertension   . Osteoporosis     vitamin D deficiency, nasal miacalcin, reclast, previous rib fracture  . Asthma   . GERD (gastroesophageal reflux disease)   . Nephrolithiasis   . OA (osteoarthritis)     Left knee replacement, hands, shoulders, cervial spine  . Vitamin D deficiency   . Inflammatory arthritis     elevated ESR, negative temporal artery bx, low titer rheumatoid factor  . Pancreatitis     s/p cholecystectomy with ERCP and stone extraction     Current Outpatient Prescriptions on File Prior to Visit  Medication Sig Dispense Refill  . acetaminophen (TYLENOL) 650 MG CR tablet Take 1,300 mg by mouth 2 (two) times daily.     Marland Kitchen amLODipine (NORVASC) 5 MG tablet Take 1 tablet (5 mg total) by mouth daily. 90 tablet 1  . Calcium Carbonate-Vitamin D (CALCIUM 600+D) 600-200 MG-UNIT TABS Take 1 tablet by mouth 2 (two) times daily.    . Cholecalciferol (VITAMIN D) 2000 UNITS tablet Take 2,000 Units by mouth daily.    . meloxicam (MOBIC) 7.5 MG tablet Take 7.5 mg by mouth daily.    . metoprolol succinate (TOPROL-XL) 25 MG 24 hr tablet Take 0.5 tablets (12.5 mg total) by mouth daily. 15 tablet 5  . montelukast (SINGULAIR) 10 MG tablet Take 1 tablet (10 mg total) by mouth daily. 30 tablet 11  . thiamine (VITAMIN B-1) 100 MG tablet Take 100 mg by mouth daily.    . vitamin C (ASCORBIC ACID) 500 MG tablet Take 500 mg by mouth daily.    .  vitamin E 400 UNIT capsule Take 400 Units by mouth daily.     No current facility-administered medications on file prior to visit.    Review of Systems  Constitutional: Negative for appetite change and unexpected weight change.  HENT: Negative for congestion and sinus pressure.   Respiratory: Negative for cough, chest tightness and shortness of breath (breathing stable.).   Cardiovascular: Negative for chest pain, palpitations and leg swelling.  Gastrointestinal: Negative for nausea, vomiting, abdominal pain and diarrhea.  Genitourinary: Negative for dysuria and difficulty urinating.  Neurological: Negative for dizziness, light-headedness (improved) and headaches.  Psychiatric/Behavioral: Negative for dysphoric mood and agitation.       Objective:     Blood pressure recheck:  134/64  Physical Exam  Constitutional: She appears well-developed and well-nourished. No distress.  HENT:  Nose: Nose normal.  Mouth/Throat: Oropharynx is clear and moist.  Neck: Neck supple. No thyromegaly present.  Cardiovascular: Normal rate and regular rhythm.   Pulmonary/Chest: Breath sounds normal. No respiratory distress. She has no wheezes.  Abdominal: Soft. Bowel sounds are normal. There is no tenderness.  Musculoskeletal: She exhibits no edema or tenderness.  Lymphadenopathy:    She has no cervical adenopathy.  Skin: No rash noted. No erythema.    BP  156/72 mmHg  Pulse 65  Temp(Src) 97.9 F (36.6 C) (Oral)  Ht '4\' 11"'  (1.499 m)  Wt 111 lb (50.349 kg)  BMI 22.41 kg/m2  SpO2 97% Wt Readings from Last 3 Encounters:  11/23/14 111 lb (50.349 kg)  09/23/14 108 lb (48.988 kg)  08/12/14 112 lb 8 oz (51.03 kg)     Lab Results  Component Value Date   WBC 6.8 11/23/2014   HGB 11.7* 11/23/2014   HCT 35.0* 11/23/2014   PLT 205.0 11/23/2014   GLUCOSE 87 11/23/2014   ALT 10 04/23/2014   AST 19 04/23/2014   NA 142 11/23/2014   K 4.3 11/23/2014   CL 103 11/23/2014   CREATININE 0.89  11/23/2014   BUN 19 11/23/2014   CO2 32 11/23/2014   TSH 0.90 01/20/2014       Assessment & Plan:   Problem List Items Addressed This Visit    Anemia - Primary    Saw GI.  Recommended if hgb stable - no further w/up.  Follow cbc.       Relevant Orders   CBC with Differential/Platelet (Completed)   Ferritin (Completed)   Dizziness    Dizziness better.  Carotid ultrasound revealed no significant stenosis.  Follow.        GERD (gastroesophageal reflux disease)    On omeprazole.  Symptoms controlled.        Hypertension    Blood pressure on recheck improved.  Has been doing well.  Follow pressures.  Follow metabolic panel.        Relevant Orders   Basic metabolic panel (Completed)   Inflammatory arthritis    Stable.  Has seen Dr Jefm Bryant.  Shoulder pain stable.        Osteoporosis    Received reclast.  Continue vitamin D.        Shoulder pain    Stable.          I spent 25 minutes with the patient and more than 50% of the time was spent in consultation regarding the above.     Einar Pheasant, MD

## 2014-11-23 NOTE — Progress Notes (Signed)
Pre visit review using our clinic review tool, if applicable. No additional management support is needed unless otherwise documented below in the visit note. 

## 2014-11-24 LAB — CBC WITH DIFFERENTIAL/PLATELET
BASOS ABS: 0 10*3/uL (ref 0.0–0.1)
Basophils Relative: 0.4 % (ref 0.0–3.0)
EOS PCT: 0.3 % (ref 0.0–5.0)
Eosinophils Absolute: 0 10*3/uL (ref 0.0–0.7)
HEMATOCRIT: 35 % — AB (ref 36.0–46.0)
Hemoglobin: 11.7 g/dL — ABNORMAL LOW (ref 12.0–15.0)
LYMPHS ABS: 1.7 10*3/uL (ref 0.7–4.0)
Lymphocytes Relative: 25.3 % (ref 12.0–46.0)
MCHC: 33.6 g/dL (ref 30.0–36.0)
MCV: 93.9 fl (ref 78.0–100.0)
MONO ABS: 0.5 10*3/uL (ref 0.1–1.0)
Monocytes Relative: 6.8 % (ref 3.0–12.0)
Neutro Abs: 4.6 10*3/uL (ref 1.4–7.7)
Neutrophils Relative %: 67.2 % (ref 43.0–77.0)
Platelets: 205 10*3/uL (ref 150.0–400.0)
RBC: 3.73 Mil/uL — ABNORMAL LOW (ref 3.87–5.11)
RDW: 13 % (ref 11.5–15.5)
WBC: 6.8 10*3/uL (ref 4.0–10.5)

## 2014-11-24 LAB — FERRITIN: FERRITIN: 23.3 ng/mL (ref 10.0–291.0)

## 2014-11-24 LAB — BASIC METABOLIC PANEL
BUN: 19 mg/dL (ref 6–23)
CO2: 32 meq/L (ref 19–32)
Calcium: 10.5 mg/dL (ref 8.4–10.5)
Chloride: 103 mEq/L (ref 96–112)
Creatinine, Ser: 0.89 mg/dL (ref 0.40–1.20)
GFR: 62.7 mL/min (ref >60.00)
Glucose, Bld: 87 mg/dL (ref 70–99)
Potassium: 4.3 mEq/L (ref 3.5–5.1)
Sodium: 142 mEq/L (ref 135–145)

## 2014-11-25 ENCOUNTER — Encounter: Payer: Self-pay | Admitting: *Deleted

## 2014-11-29 ENCOUNTER — Encounter: Payer: Self-pay | Admitting: Internal Medicine

## 2014-11-29 NOTE — Assessment & Plan Note (Signed)
Received reclast.  Continue vitamin  D.   

## 2014-11-29 NOTE — Assessment & Plan Note (Signed)
Stable

## 2014-11-29 NOTE — Assessment & Plan Note (Signed)
Dizziness better.  Carotid ultrasound revealed no significant stenosis.  Follow.

## 2014-11-29 NOTE — Assessment & Plan Note (Signed)
Blood pressure on recheck improved.  Has been doing well.  Follow pressures.  Follow metabolic panel.

## 2014-11-29 NOTE — Assessment & Plan Note (Signed)
On omeprazole.  Symptoms controlled.  

## 2014-11-29 NOTE — Assessment & Plan Note (Signed)
Saw GI.  Recommended if hgb stable - no further w/up.  Follow cbc.

## 2014-11-29 NOTE — Assessment & Plan Note (Signed)
Stable.  Has seen Dr Jefm Bryant.  Shoulder pain stable.

## 2014-12-02 DIAGNOSIS — H903 Sensorineural hearing loss, bilateral: Secondary | ICD-10-CM | POA: Diagnosis not present

## 2014-12-24 DIAGNOSIS — M546 Pain in thoracic spine: Secondary | ICD-10-CM | POA: Diagnosis not present

## 2014-12-31 DIAGNOSIS — L97521 Non-pressure chronic ulcer of other part of left foot limited to breakdown of skin: Secondary | ICD-10-CM | POA: Diagnosis not present

## 2014-12-31 DIAGNOSIS — M2042 Other hammer toe(s) (acquired), left foot: Secondary | ICD-10-CM | POA: Diagnosis not present

## 2015-01-02 NOTE — Discharge Summary (Signed)
PATIENT NAME:  Heidi [name], Heidi [name] MR#:  [id number] DATE OF BIRTH:  06-30-1918  DATE OF ADMISSION:  08/08/2014 DATE OF DISCHARGE:  08/09/2014  ADMISSION DIAGNOSIS: Acute rib fracture.  DISCHARGE DIAGNOSES: 1.  Acute rib fracture, right side, fourth and sixth ribs.  2.  Urinary tract infection.   CONSULTATIONS: None.   LABORATORIES AT DISCHARGE: White blood cells 7.9, hemoglobin 11.2, hematocrit 35, platelets are 199,000. Sodium 136, potassium 3.8, chloride 100, bicarbonate 30, BUN 21, creatinine 0.90, glucose is 112.   X-ray of the right rib shows acute mildly displaced posterolateral aspect of the right fourth rib and suspect nondisplaced fracture of the lateral aspect of the right sixth rib.   Urine culture gram-negative rods.   PHYSICAL EXAMINATION AT DISCHARGE:  VITAL SIGNS: The patient is afebrile with a temperature 97.2, pulse 78, respirations 20, blood pressure 150/70 and 96% on room air.  GENERAL: The patient to moderate distress from her rib fracture.  CARDIOVASCULAR: Regular rate and rhythm. No murmurs, gallops or rubs. PMI is not displaced. LUNGS: Clear to auscultation without crackles, rales, rhonchi or wheezing. Normal to percussion.  ABDOMEN: Bowel sounds present. Nontender, nondistended. No hepatosplenomegaly.  EXTREMITIES: No clubbing, cyanosis or edema.  NEUROLOGICAL: Cranial nerves II-XII are intact.  HOSPITAL COURSE: A very pleasant 82 year old female who took a mechanical fall and subsequently had suffered some right rib fractures. For further details, please refer to H and P. 1.  Right-sided rib fractures after a mechanical fall. The patient did have physical therapy while in the hospital. Pain was controlled with Lidoderm patch and Tylox along with Tylenol. The patient will need home health at discharge and she also has incentive spirometer to decrease a chance of pneumonia and atelectasis.  2.  Urinary tract infection. It is found that the patient has a urinary  tract infection with urine culture gram-negative rods. SHE HAS AN ALLERGY TO PENICILLIN. We did start Keflex. HER PENICILLIN ALLERGY WAS POSSIBLY A RASH AND WE DID OBSERVE HER ON KEFLEX.  3.  History of hypertension. The patient was continued on outpatient medications.  4.  History of GERD. There were no issues. The patient will remain on omeprazole.   DISCHARGE MEDICATIONS:  1.  Omeprazole 20 mg daily.  2.  Montelukast 10 mg daily. 3.  Vitamin D 100 mg daily.  4.  Vitamin C 500 mg daily.  5.  Calcium plus D b.i.d.  6.  Vitamin E 400 international units daily.  7.  Meloxicam 7.5 daily.  8.  Tylenol 650 two tablets b.i.d.  9.  Norvasc 5 mg daily.  10.  Aspirin 81 mg daily.  11.  Metoprolol 25 half a tablet daily.  12.  Acetaminophen/oxycodone 5/325 q. 4 hours p.r.n.  13.  Lidoderm patch on 12 hours, off 12 hours.  14.  Keflex 700 mg p.o. t.i.d. x 6 days.   DISCHARGE HOME HEALTH: With physical therapy nurses aide for assessment in gait.   DISCHARGE DIET: Regular diet.   DISCHARGE ACTIVITY: As tolerated.   DISCHARGE FOLLOWUP: The patient will follow up with Einar Pheasant in 1 week.  TIME SPENT: Approximately 45 minutes on discharge.   ____________________________ Ambre Kobayashi P. Benjie Karvonen, MD spm:TT D: 08/09/2014 12:05:24 ET T: 08/09/2014 18:46:22 ET JOB#: [id number]  cc: Sherri Levenhagen P. Benjie Karvonen, MD, <Dictator> Einar Pheasant, MD Donell Beers Cordelia Bessinger MD ELECTRONICALLY SIGNED 08/09/2014 20:49

## 2015-01-02 NOTE — H&P (Signed)
PATIENT NAME:  Heidi [name], Heidi [name] MR#:  [id number] DATE OF BIRTH:  [birth year]  DATE OF ADMISSION:  08/08/2014  PRIMARY CARE PHYSICIAN: Einar Pheasant, MD   REFERRING PHYSICIAN: Debbrah Alar, MD    CHIEF COMPLAINT: Fall.   HISTORY OF PRESENT ILLNESS: The patient is a 81 year old female who lives with her daughter. She was brought into the ED with a chief complaint of fall. According to the daughter, the patient stumbled backward, losing her balance, and struck the corner edge of the doorframe with her right ribs and head. The patient was complaining of right-sided rib pain. X-ray has revealed no evidence of pneumothorax, but nondisplaced fracture of the lateral aspect of the right-sided fourth and sixth ribs. Pain medications were given and the ER physician is planning to discharge the patient home. As the patient was still very uncomfortable with pain, hospitalist team is called to admit the patient for pain management. During my examination, the patient is resting. The pain was tolerable with the current pain medications. Her daughter is at bedside. As the patient fell on her back and hit her head, CAT scan of the head was done. No acute fracture or intracranial abnormalities were noticed. CAT scan of the spine has revealed no acute fracture or malalignment.   PAST MEDICAL HISTORY: Hypertension, GERD, history of nephrolithiasis, osteoarthritis, asthma, osteoporosis, history of heart flutter.   PAST SURGICAL HISTORY: Status post a hysterectomy for bleeding, left knee replacement, lithotripsy for nephrolithiasis.   PSYCHOSOCIAL HISTORY: Lives at home, lives with daughter. No history of smoking, alcohol or illicit drug usage.   FAMILY HISTORY: Mother has CVA. Father deceased at age 8 from stomach cancer. Mother deceased at 47 from hypertension and CVA.   REVIEW OF SYSTEMS:  CONSTITUTIONAL: Denies any fever, fatigue, weakness, weight loss, or weight gain.  EYES: Denies blurry vision,  double vision.  ENT: Denies epistaxis, discharge, postnasal drip.  RESPIRATORY: Denies cough, COPD. Has chronic history of asthma.  CARDIOVASCULAR: No chest pain, palpitations. History of heart flutter in the past. GASTROINTESTINAL: Denies any nausea, vomiting, diarrhea, abdominal pain. No hematemesis or melena.  GENITOURINARY: No dysuria, hematuria, incontinence.  ENDOCRINE: Denies polyuria, nocturia, thyroid problems. HEMATOLOGIC AND LYMPHATIC: No anemia, easy bruising, bleeding.  INTEGUMENTARY: No acne, rash, lesions.  MUSCULOSKELETAL: Complaining of pain in the right side of the ribs. Has chronic shoulder pains bilaterally from rotator cuff, bilateral rotator cuff tears.  NEUROLOGIC: Denies any vertigo, ataxia.  PSYCHIATRIC: No ADD, OCD, anxiety, depression.   HOME MEDICATIONS: Metoprolol 12.5 mg p.o. once daily; amlodipine 5 mg p.o. once daily; aspirin 81 mg p.o. once daily; Norco 5/325, 1 capsule p.o. every 4 to 6 hours as needed for pain; omeprazole 20 mg p.o. once daily; Singulair 10 mg p.o. once daily; vitamin C 500 mg once daily; vitamin D, dose of vitamin D is unknown; vitamin E capsule 400 international units 1 capsule p.o. once a day; Zofran 4 mg ODT q. 4-6 hours as needed for nausea and vomiting.   PHYSICAL EXAMINATION:  VITAL SIGNS: Temperature 97.9, pulse 81, respirations 20, blood pressure 157/65, pulse oximetry is 99% on room air.  GENERAL APPEARANCE: Not in any acute distress. Some discomfort from the pain with movements. Moderately built and thin-looking female.  HEENT: Normocephalic. Small lump is noticed on the right side of her head from her fall. Pupils are equally reacting to light and accommodation. No scleral icterus. No conjunctival injection. No sinus tenderness. No postnasal drip. Moist mucous membranes.  NECK: Supple. No JVD.  No thyromegaly. Range of motion is intact.  LUNGS: Clear to auscultation bilaterally. No accessory muscle usage. Positive right anterolateral  chest wall tenderness from the rib fractures.  CARDIAC: S1, S2 normal. Regular rate and rhythm. No murmurs.  GASTROINTESTINAL: Soft. Bowel sounds are positive in all 4 quadrants. Nontender, nondistended. No hepatosplenomegaly. No masses.  NEUROLOGICAL: Awake, alert and oriented x 3. Cranial nerves II through XII are grossly intact. Motor and sensory are intact. Reflexes are 2+. Following verbal commands.  EXTREMITIES: No edema. No cyanosis. No clubbing.  SKIN: Warm to touch. Normal turgor. No rashes. No lesions. The right elbow with a small skin tear from the fall. EXTREMITIES: No edema, no cyanosis, no clubbing.  PSYCHIATRIC: Normal mood and affect.  LABORATORY AND IMAGING STUDIES: CT of the head and CT of the cervical spine: No acute intracranial abnormality on CT, head; stable atrophy and chronic microvascular ischemic white matter disease. CT, spine: No acute fracture or malalignment. Multilevel cervical spondylosis and right-sided facet arthropathy without significant interval progression compared to 08/31/2003. A 12-lead EKG: Normal sinus rhythm at 73 beats per minute. No acute ST-T wave changes. UA: Nitrites and leukocyte esterase are negative. Glucose negative. Protein negative. CBC is normal. BMP: Glucose 111, BUN 23. Creatinine, sodium, potassium and chloride are normal. Anion gap 6. GFR 54. Serum osmolality and calcium are normal.   ASSESSMENT AND PLAN: A 81 year old Caucasian female brought into the Emergency Department by her daughter after she sustained a fall and fell on the right side. X-rays have revealed right rib fractures, slightly displaced fourth rib and a nondisplaced probable right sixth rib fracture. 1.  Fall with right-sided fourth rib fracture and probably sixth rib fracture as well. Will admit her to orthopedic floor. Pain management will be provided with Percocet and morphine, as-needed basis. We will provide her bowel regimen. 2.  Physical therapy consult is placed.  3.   Hypertension. Blood pressure is slightly elevated, probably from pain. Will provide pain management and will provide her metoprolol 12.5 mg p.o. once daily, which is her home medication.  4.  Gastroesophageal reflux disease. The patient takes proton pump inhibitor at home. Will provide her Pepcid in the hospital. 5.  Asthma. The patient takes inhalers at home. We will continue Singulair and albuterol, as-needed basis. 6.  We will provide her gastrointestinal prophylaxis with ranitidine and deep vein thrombosis prophylaxis with Lovenox subcutaneous.  Plan of care discussed in detail with the patient and her daughter at bedside. They both verbalized understanding of the plan.  CODE STATUS: Do not resuscitate. Daughter is the medical power-of-attorney.  TOTAL TIME SPENT: 45 minutes.   ____________________________ Nicholes Mango, MD ag:ST D: 08/08/2014 21:40:04 ET T: 08/08/2014 22:25:40 ET JOB#: [id number]  cc: Nicholes Mango, MD, <Dictator> Nicholes Mango MD ELECTRONICALLY SIGNED 08/27/2014 16:45

## 2015-01-21 DIAGNOSIS — S30860A Insect bite (nonvenomous) of lower back and pelvis, initial encounter: Secondary | ICD-10-CM | POA: Diagnosis not present

## 2015-01-21 DIAGNOSIS — S40861A Insect bite (nonvenomous) of right upper arm, initial encounter: Secondary | ICD-10-CM | POA: Diagnosis not present

## 2015-01-21 DIAGNOSIS — S40862A Insect bite (nonvenomous) of left upper arm, initial encounter: Secondary | ICD-10-CM | POA: Diagnosis not present

## 2015-01-21 DIAGNOSIS — S80861A Insect bite (nonvenomous), right lower leg, initial encounter: Secondary | ICD-10-CM | POA: Diagnosis not present

## 2015-01-28 DIAGNOSIS — J209 Acute bronchitis, unspecified: Secondary | ICD-10-CM | POA: Diagnosis not present

## 2015-01-28 DIAGNOSIS — J069 Acute upper respiratory infection, unspecified: Secondary | ICD-10-CM | POA: Diagnosis not present

## 2015-02-06 ENCOUNTER — Other Ambulatory Visit: Payer: Self-pay | Admitting: Internal Medicine

## 2015-03-04 DIAGNOSIS — M25512 Pain in left shoulder: Secondary | ICD-10-CM | POA: Diagnosis not present

## 2015-03-04 DIAGNOSIS — M25511 Pain in right shoulder: Secondary | ICD-10-CM | POA: Diagnosis not present

## 2015-03-04 DIAGNOSIS — M150 Primary generalized (osteo)arthritis: Secondary | ICD-10-CM | POA: Diagnosis not present

## 2015-03-04 DIAGNOSIS — G8929 Other chronic pain: Secondary | ICD-10-CM | POA: Diagnosis not present

## 2015-03-08 ENCOUNTER — Other Ambulatory Visit: Payer: Self-pay | Admitting: Internal Medicine

## 2015-03-09 DIAGNOSIS — L84 Corns and callosities: Secondary | ICD-10-CM | POA: Diagnosis not present

## 2015-04-08 ENCOUNTER — Ambulatory Visit (INDEPENDENT_AMBULATORY_CARE_PROVIDER_SITE_OTHER): Payer: Medicare Other | Admitting: Internal Medicine

## 2015-04-08 ENCOUNTER — Encounter: Payer: Self-pay | Admitting: Internal Medicine

## 2015-04-08 VITALS — BP 120/70 | HR 79 | Temp 97.8°F | Ht 59.0 in | Wt 103.0 lb

## 2015-04-08 DIAGNOSIS — D649 Anemia, unspecified: Secondary | ICD-10-CM

## 2015-04-08 DIAGNOSIS — L989 Disorder of the skin and subcutaneous tissue, unspecified: Secondary | ICD-10-CM

## 2015-04-08 DIAGNOSIS — R600 Localized edema: Secondary | ICD-10-CM

## 2015-04-08 DIAGNOSIS — M810 Age-related osteoporosis without current pathological fracture: Secondary | ICD-10-CM | POA: Diagnosis not present

## 2015-04-08 DIAGNOSIS — R143 Flatulence: Secondary | ICD-10-CM

## 2015-04-08 DIAGNOSIS — I10 Essential (primary) hypertension: Secondary | ICD-10-CM

## 2015-04-08 DIAGNOSIS — M1990 Unspecified osteoarthritis, unspecified site: Secondary | ICD-10-CM

## 2015-04-08 DIAGNOSIS — M064 Inflammatory polyarthropathy: Secondary | ICD-10-CM

## 2015-04-08 DIAGNOSIS — R634 Abnormal weight loss: Secondary | ICD-10-CM | POA: Diagnosis not present

## 2015-04-08 DIAGNOSIS — K219 Gastro-esophageal reflux disease without esophagitis: Secondary | ICD-10-CM

## 2015-04-08 DIAGNOSIS — IMO0001 Reserved for inherently not codable concepts without codable children: Secondary | ICD-10-CM

## 2015-04-08 LAB — CBC WITH DIFFERENTIAL/PLATELET
Basophils Absolute: 0 10*3/uL (ref 0.0–0.1)
Basophils Relative: 0.3 % (ref 0.0–3.0)
EOS PCT: 0.2 % (ref 0.0–5.0)
Eosinophils Absolute: 0 10*3/uL (ref 0.0–0.7)
HCT: 35.9 % — ABNORMAL LOW (ref 36.0–46.0)
Hemoglobin: 11.8 g/dL — ABNORMAL LOW (ref 12.0–15.0)
LYMPHS ABS: 1.4 10*3/uL (ref 0.7–4.0)
Lymphocytes Relative: 20.3 % (ref 12.0–46.0)
MCHC: 32.9 g/dL (ref 30.0–36.0)
MCV: 97.6 fl (ref 78.0–100.0)
Monocytes Absolute: 0.4 10*3/uL (ref 0.1–1.0)
Monocytes Relative: 6 % (ref 3.0–12.0)
NEUTROS ABS: 5.2 10*3/uL (ref 1.4–7.7)
NEUTROS PCT: 73.2 % (ref 43.0–77.0)
PLATELETS: 200 10*3/uL (ref 150.0–400.0)
RBC: 3.68 Mil/uL — ABNORMAL LOW (ref 3.87–5.11)
RDW: 12.7 % (ref 11.5–15.5)
WBC: 7.1 10*3/uL (ref 4.0–10.5)

## 2015-04-08 LAB — HEPATIC FUNCTION PANEL
ALK PHOS: 30 U/L — AB (ref 39–117)
ALT: 14 U/L (ref 0–35)
AST: 18 U/L (ref 0–37)
Albumin: 4.3 g/dL (ref 3.5–5.2)
BILIRUBIN DIRECT: 0.1 mg/dL (ref 0.0–0.3)
Total Bilirubin: 0.5 mg/dL (ref 0.2–1.2)
Total Protein: 6.8 g/dL (ref 6.0–8.3)

## 2015-04-08 LAB — BASIC METABOLIC PANEL
BUN: 20 mg/dL (ref 6–23)
CO2: 31 mEq/L (ref 19–32)
CREATININE: 0.92 mg/dL (ref 0.40–1.20)
Calcium: 10.4 mg/dL (ref 8.4–10.5)
Chloride: 102 mEq/L (ref 96–112)
GFR: 60.3 mL/min (ref >60.00)
GLUCOSE: 94 mg/dL (ref 70–99)
Potassium: 4.6 mEq/L (ref 3.5–5.1)
SODIUM: 140 meq/L (ref 135–145)

## 2015-04-08 LAB — TSH: TSH: 1.87 u[IU]/mL (ref 0.35–4.50)

## 2015-04-08 LAB — FERRITIN: FERRITIN: 22.6 ng/mL (ref 10.0–291.0)

## 2015-04-08 LAB — VITAMIN D 25 HYDROXY (VIT D DEFICIENCY, FRACTURES): VITD: 31.64 ng/mL (ref 30.00–100.00)

## 2015-04-08 NOTE — Progress Notes (Signed)
Pre visit review using our clinic review tool, if applicable. No additional management support is needed unless otherwise documented below in the visit note. 

## 2015-04-08 NOTE — Patient Instructions (Signed)
Align - one per day  Zantac (ranitidine) 150mg  - take one tablet 30 minutes before breakfast.

## 2015-04-08 NOTE — Progress Notes (Signed)
Patient ID: Heidi [name], female   DOB: January 04, 1918, 81 y.o.   MRN: [id number]   Subjective:    Patient ID: Heidi [name], female    DOB: 10/13/1917, 81 y.o.   MRN: [id number]  HPI  Patient here for a scheduled follow up.  Shoulder pain - stable.  Has lost weight.  Discussed with her and her daughter today.  No cardiac symptoms with increased activity or exertion.  No sob.  Some occasional discomfort in her upper stomach.  No pain.  Not on PPI.  Taking meloxicam.  Describes increased noise - GI tract.  Bowels stable.  Persistent lesion - right cheek.     Past Medical History  Diagnosis Date  . Anemia   . Hypertension   . Osteoporosis     vitamin D deficiency, nasal miacalcin, reclast, previous rib fracture  . Asthma   . GERD (gastroesophageal reflux disease)   . Nephrolithiasis   . OA (osteoarthritis)     Left knee replacement, hands, shoulders, cervial spine  . Vitamin D deficiency   . Inflammatory arthritis     elevated ESR, negative temporal artery bx, low titer rheumatoid factor  . Pancreatitis     s/p cholecystectomy with ERCP and stone extraction    Outpatient Encounter Prescriptions as of 04/08/2015  Medication Sig  . acetaminophen (TYLENOL) 650 MG CR tablet Take 1,300 mg by mouth 2 (two) times daily.   Marland Kitchen amLODipine (NORVASC) 5 MG tablet TAKE 1 TABLET ONCE DAILY  . Calcium Carbonate-Vitamin D (CALCIUM 600+D) 600-200 MG-UNIT TABS Take 1 tablet by mouth 2 (two) times daily.  . Cholecalciferol (VITAMIN D) 2000 UNITS tablet Take 2,000 Units by mouth daily.  . meloxicam (MOBIC) 7.5 MG tablet Take 7.5 mg by mouth daily.  . metoprolol succinate (TOPROL-XL) 25 MG 24 hr tablet TAKE ONE HALF TABLET DAILY  . montelukast (SINGULAIR) 10 MG tablet TAKE 1 TABLET ONCE DAILY  . thiamine (VITAMIN B-1) 100 MG tablet Take 100 mg by mouth daily.  . vitamin C (ASCORBIC ACID) 500 MG tablet Take 500 mg by mouth daily.  . vitamin E 400 UNIT capsule Take 400 Units by mouth daily.   No  facility-administered encounter medications on file as of 04/08/2015.    Review of Systems  Constitutional:       Has lost weight.  Discussed eating and nutritional supplements.   HENT: Negative for congestion and sinus pressure.   Respiratory: Negative for cough, chest tightness and shortness of breath.   Cardiovascular: Negative for chest pain, palpitations and leg swelling.  Gastrointestinal: Negative for nausea, vomiting, abdominal pain (minimal occasional discomfort.  no significant pain. ) and diarrhea.  Genitourinary: Negative for dysuria and difficulty urinating.  Musculoskeletal:       Shoulder pain - stable.   Skin: Negative for color change and rash.  Neurological: Negative for dizziness, light-headedness and headaches.  Psychiatric/Behavioral: Negative for dysphoric mood and agitation.       Objective:    Physical Exam  Constitutional: She appears well-developed and well-nourished. No distress.  HENT:  Nose: Nose normal.  Mouth/Throat: Oropharynx is clear and moist.  Neck: Neck supple. No thyromegaly present.  Cardiovascular: Normal rate and regular rhythm.   Pulmonary/Chest: Breath sounds normal. No respiratory distress. She has no wheezes.  Abdominal: Soft. Bowel sounds are normal. There is no tenderness.  Musculoskeletal: She exhibits no edema or tenderness.  Lymphadenopathy:    She has no cervical adenopathy.  Skin: No rash noted. No erythema.  Psychiatric:  She has a normal mood and affect. Her behavior is normal.    BP 120/70 mmHg  Pulse 79  Temp(Src) 97.8 F (36.6 C) (Oral)  Ht '4\' 11"'  (1.499 m)  Wt 103 lb (46.72 kg)  BMI 20.79 kg/m2  SpO2 96% Wt Readings from Last 3 Encounters:  04/08/15 103 lb (46.72 kg)  11/23/14 111 lb (50.349 kg)  09/23/14 108 lb (48.988 kg)     Lab Results  Component Value Date   WBC 7.1 04/08/2015   HGB 11.8* 04/08/2015   HCT 35.9* 04/08/2015   PLT 200.0 04/08/2015   GLUCOSE 94 04/08/2015   ALT 14 04/08/2015   AST 18  04/08/2015   NA 140 04/08/2015   K 4.6 04/08/2015   CL 102 04/08/2015   CREATININE 0.92 04/08/2015   BUN 20 04/08/2015   CO2 31 04/08/2015   TSH 1.87 04/08/2015   INR 0.9 08/30/2013       Assessment & Plan:   Problem List Items Addressed This Visit    Anemia    Saw GI.  Recommended if hgb stable - no further w/up.  Recheck cbc today.       Relevant Orders   CBC with Differential/Platelet (Completed)   Ferritin (Completed)   Gas    Increased gas.  Increased noise in her stomach.  On meloxicam.  Off PPI.  Start zantac as directed.  Align as directed.  Follow.  Get her back in soon to reassess.        GERD (gastroesophageal reflux disease)    On omeprazole.  Symptoms controlled.        Hypertension    Blood pressure under good control.  Continue same medication regimen.  Follow pressures.  Follow metabolic panel.        Relevant Orders   Basic metabolic panel (Completed)   Inflammatory arthritis    Stable.  Has seen Dr Jefm Bryant.  Shoulder stable.       Loss of weight - Primary    Weight loss as outlined.  Discussed nutritional supplements.  Follow weight.        Relevant Orders   TSH (Completed)   Hepatic function panel (Completed)   Lower extremity edema    Continue compression hose.        Osteoporosis   Relevant Orders   Vit D  25 hydroxy (rtn osteoporosis monitoring) (Completed)   Skin lesion of cheek    Persistent lesion on her right cheek.  Refer to dermatology.        Relevant Orders   Ambulatory referral to Dermatology     I spent 25 minutes with the patient and more than 50% of the time was spent in consultation regarding the above.     Einar Pheasant, MD

## 2015-04-09 ENCOUNTER — Encounter: Payer: Self-pay | Admitting: *Deleted

## 2015-04-09 NOTE — Progress Notes (Signed)
Left message on VM to return call 

## 2015-04-10 ENCOUNTER — Encounter: Payer: Self-pay | Admitting: Internal Medicine

## 2015-04-10 DIAGNOSIS — L989 Disorder of the skin and subcutaneous tissue, unspecified: Secondary | ICD-10-CM | POA: Insufficient documentation

## 2015-04-10 DIAGNOSIS — IMO0001 Reserved for inherently not codable concepts without codable children: Secondary | ICD-10-CM | POA: Insufficient documentation

## 2015-04-10 NOTE — Assessment & Plan Note (Signed)
Blood pressure under good control.  Continue same medication regimen.  Follow pressures.  Follow metabolic panel.   

## 2015-04-10 NOTE — Assessment & Plan Note (Signed)
Persistent lesion on her right cheek.  Refer to dermatology.

## 2015-04-10 NOTE — Assessment & Plan Note (Signed)
Saw GI.  Recommended if hgb stable - no further w/up.  Recheck cbc today.

## 2015-04-10 NOTE — Assessment & Plan Note (Signed)
Increased gas.  Increased noise in her stomach.  On meloxicam.  Off PPI.  Start zantac as directed.  Align as directed.  Follow.  Get her back in soon to reassess.

## 2015-04-10 NOTE — Assessment & Plan Note (Signed)
Stable.  Has seen Dr Jefm Bryant.  Shoulder stable.

## 2015-04-10 NOTE — Assessment & Plan Note (Signed)
Weight loss as outlined.  Discussed nutritional supplements.  Follow weight.

## 2015-04-10 NOTE — Assessment & Plan Note (Signed)
Continue compression hose.   

## 2015-04-10 NOTE — Assessment & Plan Note (Signed)
On omeprazole.  Symptoms controlled.  

## 2015-04-12 ENCOUNTER — Other Ambulatory Visit: Payer: Self-pay | Admitting: Internal Medicine

## 2015-04-14 ENCOUNTER — Emergency Department: Payer: Medicare Other

## 2015-04-14 ENCOUNTER — Other Ambulatory Visit: Payer: Self-pay

## 2015-04-14 ENCOUNTER — Emergency Department
Admission: EM | Admit: 2015-04-14 | Discharge: 2015-04-14 | Disposition: A | Discharge status: Home / Self Care | Payer: Medicare Other | Attending: Emergency Medicine | Admitting: Emergency Medicine

## 2015-04-14 ENCOUNTER — Encounter: Payer: Self-pay | Admitting: Emergency Medicine

## 2015-04-14 DIAGNOSIS — S4991XA Unspecified injury of right shoulder and upper arm, initial encounter: Secondary | ICD-10-CM | POA: Insufficient documentation

## 2015-04-14 DIAGNOSIS — Z791 Long term (current) use of non-steroidal anti-inflammatories (NSAID): Secondary | ICD-10-CM | POA: Diagnosis not present

## 2015-04-14 DIAGNOSIS — R0781 Pleurodynia: Secondary | ICD-10-CM

## 2015-04-14 DIAGNOSIS — S29092A Other injury of muscle and tendon of back wall of thorax, initial encounter: Secondary | ICD-10-CM | POA: Diagnosis not present

## 2015-04-14 DIAGNOSIS — M25511 Pain in right shoulder: Secondary | ICD-10-CM | POA: Diagnosis not present

## 2015-04-14 DIAGNOSIS — N390 Urinary tract infection, site not specified: Secondary | ICD-10-CM | POA: Diagnosis not present

## 2015-04-14 DIAGNOSIS — Y9389 Activity, other specified: Secondary | ICD-10-CM | POA: Insufficient documentation

## 2015-04-14 DIAGNOSIS — Y998 Other external cause status: Secondary | ICD-10-CM | POA: Diagnosis not present

## 2015-04-14 DIAGNOSIS — S299XXA Unspecified injury of thorax, initial encounter: Secondary | ICD-10-CM | POA: Diagnosis not present

## 2015-04-14 DIAGNOSIS — Z79899 Other long term (current) drug therapy: Secondary | ICD-10-CM | POA: Insufficient documentation

## 2015-04-14 DIAGNOSIS — W01198A Fall on same level from slipping, tripping and stumbling with subsequent striking against other object, initial encounter: Secondary | ICD-10-CM | POA: Diagnosis not present

## 2015-04-14 DIAGNOSIS — I10 Essential (primary) hypertension: Secondary | ICD-10-CM | POA: Insufficient documentation

## 2015-04-14 DIAGNOSIS — Z880 Allergy status to penicillin: Secondary | ICD-10-CM | POA: Insufficient documentation

## 2015-04-14 DIAGNOSIS — Y9289 Other specified places as the place of occurrence of the external cause: Secondary | ICD-10-CM | POA: Insufficient documentation

## 2015-04-14 DIAGNOSIS — Z043 Encounter for examination and observation following other accident: Secondary | ICD-10-CM | POA: Diagnosis present

## 2015-04-14 LAB — CBC WITH DIFFERENTIAL/PLATELET
BASOS PCT: 0 %
Basophils Absolute: 0 10*3/uL (ref 0–0.1)
EOS PCT: 1 %
Eosinophils Absolute: 0 10*3/uL (ref 0–0.7)
HEMATOCRIT: 37.4 % (ref 35.0–47.0)
Hemoglobin: 12.2 g/dL (ref 12.0–16.0)
Lymphocytes Relative: 16 %
Lymphs Abs: 1.2 10*3/uL (ref 1.0–3.6)
MCH: 31.7 pg (ref 26.0–34.0)
MCHC: 32.6 g/dL (ref 32.0–36.0)
MCV: 97.2 fL (ref 80.0–100.0)
MONOS PCT: 8 %
Monocytes Absolute: 0.6 10*3/uL (ref 0.2–0.9)
NEUTROS ABS: 5.7 10*3/uL (ref 1.4–6.5)
Neutrophils Relative %: 75 %
Platelets: 191 10*3/uL (ref 150–440)
RBC: 3.84 MIL/uL (ref 3.80–5.20)
RDW: 12.3 % (ref 11.5–14.5)
WBC: 7.5 10*3/uL (ref 3.6–11.0)

## 2015-04-14 LAB — BASIC METABOLIC PANEL
Anion gap: 9 (ref 5–15)
BUN: 19 mg/dL (ref 6–20)
CO2: 28 mmol/L (ref 22–32)
Calcium: 9.3 mg/dL (ref 8.9–10.3)
Chloride: 101 mmol/L (ref 101–111)
Creatinine, Ser: 0.79 mg/dL (ref 0.44–1.00)
GFR calc Af Amer: >60 mL/min (ref >60)
GFR calc non Af Amer: >60 mL/min (ref >60)
Glucose, Bld: 98 mg/dL (ref 65–99)
POTASSIUM: 3.6 mmol/L (ref 3.5–5.1)
Sodium: 138 mmol/L (ref 135–145)

## 2015-04-14 LAB — URINALYSIS COMPLETE WITH MICROSCOPIC (ARMC ONLY)
BACTERIA UA: NONE SEEN
Bilirubin Urine: NEGATIVE
GLUCOSE, UA: NEGATIVE mg/dL
HGB URINE DIPSTICK: NEGATIVE
Nitrite: NEGATIVE
PROTEIN: NEGATIVE mg/dL
SPECIFIC GRAVITY, URINE: 1.009 (ref 1.005–1.030)
pH: 7 (ref 5.0–8.0)

## 2015-04-14 LAB — TROPONIN I: Troponin I: <0.03 ng/mL (ref <0.031)

## 2015-04-14 MED ORDER — CEPHALEXIN 500 MG PO CAPS
ORAL_CAPSULE | ORAL | Status: AC
  Filled 2015-04-14: qty 1

## 2015-04-14 MED ORDER — HYDROCODONE-ACETAMINOPHEN 5-325 MG PO TABS
1.0000 | ORAL_TABLET | ORAL | Status: AC
  Administered 2015-04-14: 1 via ORAL

## 2015-04-14 MED ORDER — CEPHALEXIN 500 MG PO CAPS
500.0000 mg | ORAL_CAPSULE | Freq: Once | ORAL | Status: AC
  Administered 2015-04-14: 500 mg via ORAL

## 2015-04-14 MED ORDER — CEPHALEXIN 500 MG PO CAPS: 500.0000 mg | ORAL_CAPSULE | Freq: Three times a day (TID) | ORAL | Status: AC

## 2015-04-14 MED ORDER — HYDROCODONE-ACETAMINOPHEN 5-325 MG PO TABS
ORAL_TABLET | ORAL | Status: AC
  Filled 2015-04-14: qty 1

## 2015-04-14 NOTE — ED Notes (Signed)
Pt back.

## 2015-04-14 NOTE — ED Notes (Signed)
Per family she fell backwards  Hit right arm and back

## 2015-04-14 NOTE — Discharge Instructions (Signed)
Please seek medical attention for any high fevers, chest pain, shortness of breath, change in behavior, persistent vomiting, bloody stool or any other new or concerning symptoms. ° °Urinary Tract Infection °Urinary tract infections (UTIs) can develop anywhere along your urinary tract. Your urinary tract is your body's drainage system for removing wastes and extra water. Your urinary tract includes two kidneys, two ureters, a bladder, and a urethra. Your kidneys are a pair of bean-shaped organs. Each kidney is about the size of your fist. They are located below your ribs, one on each side of your spine. °CAUSES °Infections are caused by microbes, which are microscopic organisms, including fungi, viruses, and bacteria. These organisms are so small that they can only be seen through a microscope. Bacteria are the microbes that most commonly cause UTIs. °SYMPTOMS  °Symptoms of UTIs may vary by age and gender of the patient and by the location of the infection. Symptoms in young women typically include a frequent and intense urge to urinate and a painful, burning feeling in the bladder or urethra during urination. Older women and men are more likely to be tired, shaky, and weak and have muscle aches and abdominal pain. A fever may mean the infection is in your kidneys. Other symptoms of a kidney infection include pain in your back or sides below the ribs, nausea, and vomiting. °DIAGNOSIS °To diagnose a UTI, your caregiver will ask you about your symptoms. Your caregiver also will ask to provide a urine sample. The urine sample will be tested for bacteria and white blood cells. White blood cells are made by your body to help fight infection. °TREATMENT  °Typically, UTIs can be treated with medication. Because most UTIs are caused by a bacterial infection, they usually can be treated with the use of antibiotics. The choice of antibiotic and length of treatment depend on your symptoms and the type of bacteria causing your  infection. °HOME CARE INSTRUCTIONS °· If you were prescribed antibiotics, take them exactly as your caregiver instructs you. Finish the medication even if you feel better after you have only taken some of the medication. °· Drink enough water and fluids to keep your urine clear or pale yellow. °· Avoid caffeine, tea, and carbonated beverages. They tend to irritate your bladder. °· Empty your bladder often. Avoid holding urine for long periods of time. °· Empty your bladder before and after sexual intercourse. °· After a bowel movement, women should cleanse from front to back. Use each tissue only once. °SEEK MEDICAL CARE IF:  °· You have back pain. °· You develop a fever. °· Your symptoms do not begin to resolve within 3 days. °SEEK IMMEDIATE MEDICAL CARE IF:  °· You have severe back pain or lower abdominal pain. °· You develop chills. °· You have nausea or vomiting. °· You have continued burning or discomfort with urination. °MAKE SURE YOU:  °· Understand these instructions. °· Will watch your condition. °· Will get help right away if you are not doing well or get worse. °Document Released: 06/07/2005 Document Revised: 02/27/2012 Document Reviewed: 10/06/2011 °[employer]® Patient Information ©2015 [employer]. This information is not intended to replace advice given to you by your health care provider. Make sure you discuss any questions you have with your health care provider. ° °

## 2015-04-14 NOTE — ED Provider Notes (Signed)
[facility] Emergency Department Provider Note    ____________________________________________  Time seen: 1825  I have reviewed the triage vital signs and the nursing notes.   HISTORY  Chief Complaint Fall   History limited by: Not Limited   HPI Heidi [name] is a 81 y.o. female who presents to the emergency department today after a fall. The patient was standing up when she started feeling a little lightheaded and head and fell backwards. She struck the right upper back and right shoulder. She is now complaining of pain primarily in those 2 spots. She denies hitting her head or any neck pain. She does have a history of syncope and episodes where she becomes lightheaded. Per family she has been worked up extensively for this including carotid Dopplers. Patient states this episode reminded her of her previous similar episodes. Denies any recent fevers, chest pain or shortness of breath.     Past Medical History  Diagnosis Date  . Anemia   . Hypertension   . Osteoporosis     vitamin D deficiency, nasal miacalcin, reclast, previous rib fracture  . Asthma   . GERD (gastroesophageal reflux disease)   . Nephrolithiasis   . OA (osteoarthritis)     Left knee replacement, hands, shoulders, cervial spine  . Vitamin D deficiency   . Inflammatory arthritis     elevated ESR, negative temporal artery bx, low titer rheumatoid factor  . Pancreatitis     s/p cholecystectomy with ERCP and stone extraction    Patient Active Problem List   Diagnosis Date Noted  . Skin lesion of cheek 04/10/2015  . Gas 04/10/2015  . Loss of weight 04/08/2015  . Rib pain on right side 08/16/2014  . Shoulder pain 08/16/2014  . Lower extremity edema 01/20/2014  . Leg pain 01/20/2014  . Rash 09/12/2013  . Urinary frequency 09/12/2013  . Headache 02/19/2013  . Dizziness 02/19/2013  . Inflammatory arthritis 08/16/2012  . Osteoarthritis 08/16/2012  . Osteoporosis  08/16/2012  . Anemia 08/16/2012  . GERD (gastroesophageal reflux disease) 08/16/2012  . Hypertension 08/16/2012    Past Surgical History  Procedure Laterality Date  . Replacement total knee      Left knee replacement  . Lithotripsy    . Abdominal hysterectomy      secondary  to bleeding    Current Outpatient Rx  Name  Route  Sig  Dispense  Refill  . acetaminophen (TYLENOL) 650 MG CR tablet   Oral   Take 1,300 mg by mouth 2 (two) times daily.          Marland Kitchen amLODipine (NORVASC) 5 MG tablet      TAKE 1 TABLET ONCE DAILY   90 tablet   1   . Calcium Carbonate-Vitamin D (CALCIUM 600+D) 600-200 MG-UNIT TABS   Oral   Take 1 tablet by mouth 2 (two) times daily.         . Cholecalciferol (VITAMIN D) 2000 UNITS tablet   Oral   Take 2,000 Units by mouth daily.         . meloxicam (MOBIC) 7.5 MG tablet   Oral   Take 7.5 mg by mouth daily.         . metoprolol succinate (TOPROL-XL) 25 MG 24 hr tablet      TAKE ONE HALF TABLET DAILY   15 tablet   0   . montelukast (SINGULAIR) 10 MG tablet      TAKE 1 TABLET ONCE DAILY   30 tablet  5   . thiamine (VITAMIN B-1) 100 MG tablet   Oral   Take 100 mg by mouth daily.         . vitamin C (ASCORBIC ACID) 500 MG tablet   Oral   Take 500 mg by mouth daily.         . vitamin E 400 UNIT capsule   Oral   Take 400 Units by mouth daily.           Allergies Aspirin; Levaquin; Penicillins; Sulfa antibiotics; Tramadol; Macrobid; and Omnicef  Family History  Problem Relation Age of Onset  . Hypertension Mother   . Stroke Mother   . Stomach cancer Father   . Arthritis Brother   . Lung cancer Brother     Social History History  Substance Use Topics  . Smoking status: Never Smoker   . Smokeless tobacco: Never Used  . Alcohol Use: No    Review of Systems  Constitutional: Negative for fever. Cardiovascular: Negative for chest pain. Respiratory: Negative for shortness of breath. Gastrointestinal: Negative  for abdominal pain, vomiting and diarrhea. Genitourinary: Negative for dysuria. Musculoskeletal: Right upper back pain. Right shoulder pain. Skin: Negative for rash. Neurological: Negative for headaches, focal weakness or numbness.   10-point ROS otherwise negative.  ____________________________________________   PHYSICAL EXAM:  VITAL SIGNS: ED Triage Vitals  Enc Vitals Group     BP 04/14/15 1723 164/83 mmHg     Pulse Rate 04/14/15 1723 85     Resp 04/14/15 1723 18     Temp 04/14/15 1723 98 F (36.7 C)     Temp Source 04/14/15 1723 Oral     SpO2 04/14/15 1723 98 %     Weight 04/14/15 1723 103 lb (46.72 kg)     Height 04/14/15 1723 5' (1.524 m)     Head Cir --      Peak Flow --      Pain Score 04/14/15 1724 6   Constitutional: Alert and oriented. Well appearing and in no distress. Eyes: Conjunctivae are normal. PERRL. Normal extraocular movements. ENT   Head: Normocephalic and atraumatic.   Nose: No congestion/rhinnorhea.   Mouth/Throat: Mucous membranes are moist.   Neck: No stridor. Hematological/Lymphatic/Immunilogical: No cervical lymphadenopathy. Cardiovascular: Normal rate, regular rhythm.  No murmurs, rubs, or gallops. Respiratory: Normal respiratory effort without tachypnea nor retractions. Breath sounds are clear and equal bilaterally. No wheezes/rales/rhonchi. Gastrointestinal: Soft and nontender. No distention. There is no CVA tenderness. Genitourinary: Deferred Musculoskeletal: Decreased range of motion of bilateral shoulders. Patient does have tenderness to palpation of the right posterior ribs. No obvious extremity deformities. Neurologic:  Normal speech and language. No gross focal neurologic deficits are appreciated. Speech is normal.  Skin:  Skin is warm, dry and intact. No rash noted. Psychiatric: Mood and affect are normal. Speech and behavior are normal. Patient exhibits appropriate insight and  judgment.  ____________________________________________    LABS (pertinent positives/negatives)  Labs Reviewed  URINALYSIS COMPLETEWITH MICROSCOPIC ([place]) - Abnormal; Notable for the following:    Color, Urine STRAW (*)    APPearance CLEAR (*)    Ketones, ur TRACE (*)    Leukocytes, UA 2+ (*)    Squamous Epithelial / LPF 0-5 (*)    All other components within normal limits  CBC WITH DIFFERENTIAL/PLATELET  BASIC METABOLIC PANEL  TROPONIN I      ____________________________________________   EKG  I, Nance Pear, attending physician, personally viewed and interpreted this EKG  EKG Time: 1939 Rate: 76 Rhythm:  normal sinus rhythm Axis: normal Intervals: normal QRS: normal ST changes: no st elevation    ____________________________________________    RADIOLOGY  Right rib series IMPRESSION: No acute cardiopulmonary disease.  Multiple old right-sided rib fractures. No definite acute rib Fractures.  Right shoulder  IMPRESSION: Degenerative changes of the right shoulder without evidence for acute fracture.  Multiple age-indeterminate lateral right rib fractures, incompletely evaluated. See dedicated rib radiograph report.  ____________________________________________   PROCEDURES  Procedure(s) performed: None  Critical Care performed: No  ____________________________________________   INITIAL IMPRESSION / ASSESSMENT AND PLAN / ED COURSE  Pertinent labs & imaging results that were available during my care of the patient were reviewed by me and considered in my medical decision making (see chart for details).  Patient presents to the emergency department today after a fall with right sided back and shoulder pain. Right rib series and right shoulder x-rays negative. Lab work concerning for possible urinary tract infection. Will discharge patient home on prescription for Keflex and instructed patient to follow-up with primary care doctor.  Patient states her reaction to cefdinir was dizziness. Thus I think this is the best option for patient at this time for her UTI.  ____________________________________________   FINAL CLINICAL IMPRESSION(S) / ED DIAGNOSES  Final diagnoses:  Rib pain  UTI (lower urinary tract infection)     Nance Pear, MD 04/14/15 2134

## 2015-04-14 NOTE — ED Notes (Signed)
Pt in xray

## 2015-04-16 ENCOUNTER — Telehealth: Payer: Self-pay | Admitting: Internal Medicine

## 2015-04-16 NOTE — Telephone Encounter (Signed)
Spoke with patient's daughter. She states that the patient is doing well, and she is just a little bruised up & sore. Patient's daughter said that they did an x-ray and everything looked well; nothing was broken or fractured. Patient's daughter states that while they were in the ED, they did find a UTI which is being treated with antibiotics. Patient's daughter says that they were advised to follow up with PCP, but she thinks that there is no reason to follow up. She states that the patient is doing well, and that she has an appt in September. Patient's daughter states that if anything arises between now and her September appt, she will try to schedule something before then unless Dr. Nicki Reaper recommends that she has a f/u before then.

## 2015-04-16 NOTE — Telephone Encounter (Signed)
Pt needs a ER follow up Please advice where to add pt. Thank You.rm

## 2015-04-16 NOTE — Telephone Encounter (Signed)
If she does not feel she needs to be seen, then I am ok with this.  Let us know if any problems or if needs anything.

## 2015-04-19 DIAGNOSIS — H3531 Nonexudative age-related macular degeneration: Secondary | ICD-10-CM | POA: Diagnosis not present

## 2015-04-19 DIAGNOSIS — H1850 Unspecified hereditary corneal dystrophies: Secondary | ICD-10-CM | POA: Diagnosis not present

## 2015-04-21 NOTE — Telephone Encounter (Signed)
noted 

## 2015-05-11 ENCOUNTER — Other Ambulatory Visit: Payer: Self-pay | Admitting: Internal Medicine

## 2015-05-13 DIAGNOSIS — L97521 Non-pressure chronic ulcer of other part of left foot limited to breakdown of skin: Secondary | ICD-10-CM | POA: Diagnosis not present

## 2015-05-21 DIAGNOSIS — L570 Actinic keratosis: Secondary | ICD-10-CM | POA: Diagnosis not present

## 2015-05-21 DIAGNOSIS — L814 Other melanin hyperpigmentation: Secondary | ICD-10-CM | POA: Diagnosis not present

## 2015-05-21 DIAGNOSIS — D485 Neoplasm of uncertain behavior of skin: Secondary | ICD-10-CM | POA: Diagnosis not present

## 2015-06-10 ENCOUNTER — Ambulatory Visit (INDEPENDENT_AMBULATORY_CARE_PROVIDER_SITE_OTHER): Payer: Medicare Other | Admitting: Internal Medicine

## 2015-06-10 ENCOUNTER — Ambulatory Visit: Payer: Medicare Other | Admitting: Internal Medicine

## 2015-06-10 ENCOUNTER — Encounter: Payer: Self-pay | Admitting: Internal Medicine

## 2015-06-10 VITALS — BP 120/70 | HR 77 | Temp 98.0°F | Resp 18 | Ht 59.0 in | Wt 101.2 lb

## 2015-06-10 DIAGNOSIS — Z23 Encounter for immunization: Secondary | ICD-10-CM | POA: Diagnosis not present

## 2015-06-10 DIAGNOSIS — L989 Disorder of the skin and subcutaneous tissue, unspecified: Secondary | ICD-10-CM

## 2015-06-10 DIAGNOSIS — I10 Essential (primary) hypertension: Secondary | ICD-10-CM | POA: Diagnosis not present

## 2015-06-10 DIAGNOSIS — M810 Age-related osteoporosis without current pathological fracture: Secondary | ICD-10-CM

## 2015-06-10 DIAGNOSIS — M064 Inflammatory polyarthropathy: Secondary | ICD-10-CM

## 2015-06-10 DIAGNOSIS — R0781 Pleurodynia: Secondary | ICD-10-CM

## 2015-06-10 DIAGNOSIS — K219 Gastro-esophageal reflux disease without esophagitis: Secondary | ICD-10-CM

## 2015-06-10 DIAGNOSIS — D649 Anemia, unspecified: Secondary | ICD-10-CM

## 2015-06-10 DIAGNOSIS — R634 Abnormal weight loss: Secondary | ICD-10-CM

## 2015-06-10 DIAGNOSIS — M1990 Unspecified osteoarthritis, unspecified site: Secondary | ICD-10-CM

## 2015-06-10 MED ORDER — AMLODIPINE BESYLATE 5 MG PO TABS: 5.0000 mg | ORAL_TABLET | Freq: Every day | ORAL | Status: DC

## 2015-06-10 MED ORDER — MONTELUKAST SODIUM 10 MG PO TABS: 10.0000 mg | ORAL_TABLET | Freq: Every day | ORAL | Status: DC

## 2015-06-10 MED ORDER — METOPROLOL SUCCINATE ER 25 MG PO TB24: ORAL_TABLET | ORAL | Status: DC

## 2015-06-10 NOTE — Progress Notes (Signed)
Patient ID: Heidi [name], female   DOB: 05/08/1918, 81 y.o.   MRN: [id number]   Subjective:    Patient ID: Heidi [name], female    DOB: June 21, 1918, 81 y.o.   MRN: [id number]  HPI  Patient with past history of hypertension, GERD, osteoporosis and weight loss.  She comes in today to follow up on these issues.  She is accompanied by her daughter.  History obtained from both of them.  She has been eating.  No nausea or vomiting.   Weight continues to decrease.  Discussed the need to increase po intake.  Discussed adding nutritional supplements.  Reports some intermittent upper abdomina pain.  No pain now.  No known triggers.  Some constipation at times.  Discussed using miralax on a regular basis.  No cardiac symptoms with increased activity or exertion.  No sob.    Past Medical History  Diagnosis Date  . Anemia   . Hypertension   . Osteoporosis     vitamin D deficiency, nasal miacalcin, reclast, previous rib fracture  . Asthma   . GERD (gastroesophageal reflux disease)   . Nephrolithiasis   . OA (osteoarthritis)     Left knee replacement, hands, shoulders, cervial spine  . Vitamin D deficiency   . Inflammatory arthritis     elevated ESR, negative temporal artery bx, low titer rheumatoid factor  . Pancreatitis     s/p cholecystectomy with ERCP and stone extraction   Past Surgical History  Procedure Laterality Date  . Replacement total knee      Left knee replacement  . Lithotripsy    . Abdominal hysterectomy      secondary  to bleeding   Family History  Problem Relation Age of Onset  . Hypertension Mother   . Stroke Mother   . Stomach cancer Father   . Arthritis Brother   . Lung cancer Brother    Social History   Social History  . Marital Status: Unknown    Spouse Name: N/A  . Number of Children: 5  . Years of Education: N/A   Social History Main Topics  . Smoking status: Never Smoker   . Smokeless tobacco: Never Used  . Alcohol Use: No  . Drug Use: No    . Sexual Activity: Not Asked   Other Topics Concern  . None   Social History Narrative   She is widowed. She has five children, three daughter and two son.    Outpatient Encounter Prescriptions as of 06/10/2015  Medication Sig  . acetaminophen (TYLENOL) 650 MG CR tablet Take 1,300 mg by mouth 2 (two) times daily.   Marland Kitchen amLODipine (NORVASC) 5 MG tablet Take 1 tablet (5 mg total) by mouth daily.  . Calcium Carbonate-Vitamin D (CALCIUM 600+D) 600-200 MG-UNIT TABS Take 1 tablet by mouth 2 (two) times daily.  . Cholecalciferol (VITAMIN D) 2000 UNITS tablet Take 2,000 Units by mouth daily.  . meloxicam (MOBIC) 7.5 MG tablet Take 7.5 mg by mouth daily.  . metoprolol succinate (TOPROL-XL) 25 MG 24 hr tablet TAKE ONE HALF TABLET DAILY  . montelukast (SINGULAIR) 10 MG tablet Take 1 tablet (10 mg total) by mouth daily.  Marland Kitchen thiamine (VITAMIN B-1) 100 MG tablet Take 100 mg by mouth daily.  . vitamin C (ASCORBIC ACID) 500 MG tablet Take 500 mg by mouth daily.  . vitamin E 400 UNIT capsule Take 400 Units by mouth daily.  . [DISCONTINUED] amLODipine (NORVASC) 5 MG tablet TAKE 1 TABLET ONCE DAILY  . [  DISCONTINUED] metoprolol succinate (TOPROL-XL) 25 MG 24 hr tablet TAKE ONE HALF TABLET DAILY  . [DISCONTINUED] montelukast (SINGULAIR) 10 MG tablet TAKE 1 TABLET ONCE DAILY   No facility-administered encounter medications on file as of 06/10/2015.    Review of Systems  Constitutional: Negative for fatigue.       Weight loss.    HENT: Negative for congestion and sinus pressure.   Eyes: Negative for pain and visual disturbance.  Respiratory: Negative for cough, chest tightness and shortness of breath.   Cardiovascular: Negative for chest pain, palpitations and leg swelling.  Gastrointestinal: Positive for abdominal pain (upper abdominal pain as outlined.  intermittent.  none now.  ) and constipation. Negative for nausea, vomiting and diarrhea.  Genitourinary: Negative for dysuria and difficulty urinating.   Musculoskeletal:       Persistent shoulder discomfort and limitations with movement.    Skin: Negative for color change and rash.  Neurological: Negative for dizziness, light-headedness and headaches.  Hematological: Negative for adenopathy.       Bruises easily.   Psychiatric/Behavioral: Negative for dysphoric mood and agitation.       Objective:    Physical Exam  Constitutional: She appears well-developed and well-nourished. No distress.  HENT:  Nose: Nose normal.  Mouth/Throat: Oropharynx is clear and moist.  Eyes: Conjunctivae are normal. Right eye exhibits no discharge. Left eye exhibits no discharge.  Neck: Neck supple. No thyromegaly present.  Cardiovascular: Normal rate and regular rhythm.   Pulmonary/Chest: Breath sounds normal. No respiratory distress. She has no wheezes.  Abdominal: Soft. Bowel sounds are normal. There is no tenderness.  Musculoskeletal: She exhibits no edema or tenderness.  Lymphadenopathy:    She has no cervical adenopathy.  Skin: No rash noted. No erythema.  Psychiatric: She has a normal mood and affect. Her behavior is normal.    BP 120/70 mmHg  Pulse 77  Temp(Src) 98 F (36.7 C) (Oral)  Resp 18  Ht 4' 11" (1.499 m)  Wt 101 lb 4 oz (45.927 kg)  BMI 20.44 kg/m2  SpO2 97% Wt Readings from Last 3 Encounters:  06/10/15 101 lb 4 oz (45.927 kg)  04/14/15 103 lb (46.72 kg)  04/08/15 103 lb (46.72 kg)     Lab Results  Component Value Date   WBC 7.5 04/14/2015   HGB 12.2 04/14/2015   HCT 37.4 04/14/2015   PLT 191 04/14/2015   GLUCOSE 98 04/14/2015   ALT 14 04/08/2015   AST 18 04/08/2015   NA 138 04/14/2015   K 3.6 04/14/2015   CL 101 04/14/2015   CREATININE 0.79 04/14/2015   BUN 19 04/14/2015   CO2 28 04/14/2015   TSH 1.87 04/08/2015   INR 0.9 08/30/2013    Dg Ribs Unilateral Right  04/14/2015   CLINICAL DATA:  Right lower posterior rib pain and right shoulder pain after fall today.  EXAM: RIGHT RIBS - 2 VIEW  COMPARISON:   08/08/2014 and 08/31/2013  FINDINGS: Lungs are clear. Cardiomediastinal silhouette is within normal. There are degenerative changes of the spine and shoulders. Loose body over the right glenohumeral joint unchanged. There are multiple old posterior and lateral right-sided rib fractures of ribs 3 through approximately 8. No definite acute fractures noted.  IMPRESSION: No acute cardiopulmonary disease.  Multiple old right-sided rib fractures. No definite acute rib fractures.   Electronically Signed   By: Marin Olp M.D.   On: 04/14/2015 19:32   Dg Shoulder Right  04/14/2015   CLINICAL DATA:  Patient status post  fall with right posterior rib and shoulder pain. Initial encounter.  EXAM: RIGHT SHOULDER - 2+ VIEW  COMPARISON:  Chest radiograph 08/08/2014  FINDINGS: AC and glenohumeral joint degenerative change. No evidence for acute displaced fracture. Normal anatomic alignment. There are multiple age-indeterminate lateral right rib fractures.  IMPRESSION: Degenerative changes of the right shoulder without evidence for acute fracture.  Multiple age-indeterminate lateral right rib fractures, incompletely evaluated. See dedicated rib radiograph report.   Electronically Signed   By: Lovey Newcomer M.D.   On: 04/14/2015 19:29       Assessment & Plan:   Problem List Items Addressed This Visit    Anemia    Saw GI.  Recommended no further w/up.        GERD (gastroesophageal reflux disease)    Symptoms controlled on omeprazole.        Hypertension    Blood pressure under good control.  Continue same medication regimen.  Follow pressures.  Follow metabolic panel.        Relevant Medications   amLODipine (NORVASC) 5 MG tablet   metoprolol succinate (TOPROL-XL) 25 MG 24 hr tablet   Inflammatory arthritis    Has seen Dr Jefm Bryant.  Stable.       Loss of weight    Discussed with her and her daughter today.  Discussed nutritional supplements.  Follow.        Osteoporosis    Has received reclast.   Continue vitamin D.  Recheck bone density.  Recent rib fracture.       Relevant Orders   DG Bone Density   Rib pain on right side    S/p previous fall.  Rib fractures.  Feel again.  No acute fracture.  Still sore.  Good breath sounds.  Check bone density.        Skin lesion of cheek    Saw dermatology - biopsy - actinic keratosis.         Other Visit Diagnoses    Encounter for immunization    -  Primary        Einar Pheasant, MD

## 2015-06-10 NOTE — Progress Notes (Signed)
Pre-visit discussion using our clinic review tool. No additional management support is needed unless otherwise documented below in the visit note.  

## 2015-06-10 NOTE — Patient Instructions (Signed)

## 2015-06-11 ENCOUNTER — Encounter: Payer: Self-pay | Admitting: Internal Medicine

## 2015-06-11 NOTE — Assessment & Plan Note (Signed)
Saw dermatology - biopsy - actinic keratosis.

## 2015-06-11 NOTE — Assessment & Plan Note (Signed)
Discussed with her and her daughter today.  Discussed nutritional supplements.  Follow.

## 2015-06-11 NOTE — Assessment & Plan Note (Signed)
Has received reclast.  Continue vitamin D.  Recheck bone density.  Recent rib fracture.

## 2015-06-11 NOTE — Assessment & Plan Note (Signed)
Blood pressure under good control.  Continue same medication regimen.  Follow pressures.  Follow metabolic panel.   

## 2015-06-11 NOTE — Assessment & Plan Note (Signed)
Symptoms controlled on omeprazole.   

## 2015-06-11 NOTE — Assessment & Plan Note (Signed)
Has seen Dr Jefm Bryant.  Stable.

## 2015-06-11 NOTE — Assessment & Plan Note (Signed)
S/p previous fall.  Rib fractures.  Feel again.  No acute fracture.  Still sore.  Good breath sounds.  Check bone density.

## 2015-06-11 NOTE — Assessment & Plan Note (Signed)
Saw GI.  Recommended no further w/up.

## 2015-07-07 DIAGNOSIS — L02611 Cutaneous abscess of right foot: Secondary | ICD-10-CM | POA: Diagnosis not present

## 2015-08-26 ENCOUNTER — Encounter: Payer: Self-pay | Admitting: Internal Medicine

## 2015-08-26 ENCOUNTER — Ambulatory Visit (INDEPENDENT_AMBULATORY_CARE_PROVIDER_SITE_OTHER): Payer: Medicare Other | Admitting: Internal Medicine

## 2015-08-26 VITALS — BP 138/70 | HR 79 | Temp 97.5°F | Resp 18 | Ht 59.0 in | Wt 105.5 lb

## 2015-08-26 DIAGNOSIS — R0781 Pleurodynia: Secondary | ICD-10-CM

## 2015-08-26 DIAGNOSIS — M810 Age-related osteoporosis without current pathological fracture: Secondary | ICD-10-CM | POA: Diagnosis not present

## 2015-08-26 DIAGNOSIS — K219 Gastro-esophageal reflux disease without esophagitis: Secondary | ICD-10-CM

## 2015-08-26 DIAGNOSIS — R634 Abnormal weight loss: Secondary | ICD-10-CM

## 2015-08-26 DIAGNOSIS — R519 Headache, unspecified: Secondary | ICD-10-CM

## 2015-08-26 DIAGNOSIS — R51 Headache: Secondary | ICD-10-CM

## 2015-08-26 DIAGNOSIS — I10 Essential (primary) hypertension: Secondary | ICD-10-CM | POA: Diagnosis not present

## 2015-08-26 DIAGNOSIS — M1990 Unspecified osteoarthritis, unspecified site: Secondary | ICD-10-CM

## 2015-08-26 DIAGNOSIS — D649 Anemia, unspecified: Secondary | ICD-10-CM

## 2015-08-26 DIAGNOSIS — M25519 Pain in unspecified shoulder: Secondary | ICD-10-CM

## 2015-08-26 NOTE — Progress Notes (Signed)
Pre-visit discussion using our clinic review tool. No additional management support is needed unless otherwise documented below in the visit note.  

## 2015-08-26 NOTE — Progress Notes (Signed)
Patient ID: Heidi [name], female   DOB: 1918-02-06, 81 y.o.   MRN: [id number]   Subjective:    Patient ID: Heidi [name], female    DOB: 02-29-1918, 81 y.o.   MRN: [id number]  HPI  Patient with past history of hypertension, inflammatory arthritis, GERD and anemia.  She comes in today to follow up on these issues.  She is accompanied by her daughter.  History obtained from both of them.  She feels overall things are stable.  Eating.  Weight is up some.  No chest pain or tightness.  Breathing stable.  Some headache.  Discussed with her today.  Discussed using her nasal sprays.  Desires no further intervention.  No abdominal pain or cramping.  Still notices some pain - right lateral chest/ribs.  Is s/u multiple fractures.  Is better.  Pain worse with certain movements.  No pain with deep breathing.  Bowels stable.     Past Medical History  Diagnosis Date  . Anemia   . Hypertension   . Osteoporosis     vitamin D deficiency, nasal miacalcin, reclast, previous rib fracture  . Asthma   . GERD (gastroesophageal reflux disease)   . Nephrolithiasis   . OA (osteoarthritis)     Left knee replacement, hands, shoulders, cervial spine  . Vitamin D deficiency   . Inflammatory arthritis (HCC)     elevated ESR, negative temporal artery bx, low titer rheumatoid factor  . Pancreatitis     s/p cholecystectomy with ERCP and stone extraction   Past Surgical History  Procedure Laterality Date  . Replacement total knee      Left knee replacement  . Lithotripsy    . Abdominal hysterectomy      secondary  to bleeding   Family History  Problem Relation Age of Onset  . Hypertension Mother   . Stroke Mother   . Stomach cancer Father   . Arthritis Brother   . Lung cancer Brother    Social History   Social History  . Marital Status: Unknown    Spouse Name: N/A  . Number of Children: 5  . Years of Education: N/A   Social History Main Topics  . Smoking status: Never Smoker   . Smokeless  tobacco: Never Used  . Alcohol Use: No  . Drug Use: No  . Sexual Activity: Not Asked   Other Topics Concern  . None   Social History Narrative   She is widowed. She has five children, three daughter and two son.    Outpatient Encounter Prescriptions as of 08/26/2015  Medication Sig  . acetaminophen (TYLENOL) 650 MG CR tablet Take 1,300 mg by mouth 2 (two) times daily.   Marland Kitchen amLODipine (NORVASC) 5 MG tablet Take 1 tablet (5 mg total) by mouth daily.  . Calcium Carbonate-Vitamin D (CALCIUM 600+D) 600-200 MG-UNIT TABS Take 1 tablet by mouth 2 (two) times daily.  . Cholecalciferol (VITAMIN D) 2000 UNITS tablet Take 2,000 Units by mouth daily.  . meloxicam (MOBIC) 7.5 MG tablet Take 7.5 mg by mouth daily.  . metoprolol succinate (TOPROL-XL) 25 MG 24 hr tablet TAKE ONE HALF TABLET DAILY  . montelukast (SINGULAIR) 10 MG tablet Take 1 tablet (10 mg total) by mouth daily.  Marland Kitchen thiamine (VITAMIN B-1) 100 MG tablet Take 100 mg by mouth daily.  . vitamin C (ASCORBIC ACID) 500 MG tablet Take 500 mg by mouth daily.  . vitamin E 400 UNIT capsule Take 400 Units by mouth daily.   No  facility-administered encounter medications on file as of 08/26/2015.    Review of Systems  Constitutional: Negative for appetite change and unexpected weight change.  HENT: Positive for postnasal drip. Negative for congestion.   Eyes: Negative for discharge and redness.  Respiratory: Positive for cough. Negative for chest tightness and shortness of breath.   Cardiovascular: Negative for chest pain, palpitations and leg swelling.  Gastrointestinal: Negative for nausea, vomiting, abdominal pain and diarrhea.  Genitourinary: Negative for dysuria and difficulty urinating.  Musculoskeletal: Negative for joint swelling.       Persistent rib pain as outlined.  Persistent limited rom in her shoulder.    Skin: Negative for color change and rash.  Neurological: Positive for headaches (does report some headaches as outlined. ).  Negative for dizziness and light-headedness.  Psychiatric/Behavioral: Negative for dysphoric mood and agitation.       Objective:     Blood pressure rechecked by me:  124/60  Physical Exam  Constitutional: She appears well-developed and well-nourished. No distress.  HENT:  Nose: Nose normal.  Mouth/Throat: Oropharynx is clear and moist.  Eyes: Conjunctivae are normal. Right eye exhibits no discharge. Left eye exhibits no discharge.  Neck: Neck supple. No thyromegaly present.  Cardiovascular: Normal rate and regular rhythm.   Pulmonary/Chest: Breath sounds normal. No respiratory distress. She has no wheezes.  Good breath sounds bilaterally.  No pain with deep breathing.    Abdominal: Soft. Bowel sounds are normal. There is no tenderness.  Musculoskeletal: She exhibits no edema or tenderness.  Increased pain localized one area - right lateral rib.    Lymphadenopathy:    She has no cervical adenopathy.  Skin: No rash noted. No erythema.  Psychiatric: She has a normal mood and affect. Her behavior is normal.    BP 138/70 mmHg  Pulse 79  Temp(Src) 97.5 F (36.4 C) (Oral)  Resp 18  Ht _0  (1.499 m)  Wt 105 lb 8 oz (47.854 kg)  BMI 21.30 kg/m2  SpO2 98% Wt Readings from Last 3 Encounters:  08/26/15 105 lb 8 oz (47.854 kg)  06/10/15 101 lb 4 oz (45.927 kg)  04/14/15 103 lb (46.72 kg)     Lab Results  Component Value Date   WBC 7.5 04/14/2015   HGB 12.2 04/14/2015   HCT 37.4 04/14/2015   PLT 191 04/14/2015   GLUCOSE 98 04/14/2015   ALT 14 04/08/2015   AST 18 04/08/2015   NA 138 04/14/2015   K 3.6 04/14/2015   CL 101 04/14/2015   CREATININE 0.79 04/14/2015   BUN 19 04/14/2015   CO2 28 04/14/2015   TSH 1.87 04/08/2015   INR 0.9 08/30/2013    Dg Ribs Unilateral Right  04/14/2015  CLINICAL DATA:  Right lower posterior rib pain and right shoulder pain after fall today. EXAM: RIGHT RIBS - 2 VIEW COMPARISON:  08/08/2014 and 08/31/2013 FINDINGS: Lungs are clear.  Cardiomediastinal silhouette is within normal. There are degenerative changes of the spine and shoulders. Loose body over the right glenohumeral joint unchanged. There are multiple old posterior and lateral right-sided rib fractures of ribs 3 through approximately 8. No definite acute fractures noted. IMPRESSION: No acute cardiopulmonary disease. Multiple old right-sided rib fractures. No definite acute rib fractures. Electronically Signed   By: Marin Olp M.D.   On: 04/14/2015 19:32   Dg Shoulder Right  04/14/2015  CLINICAL DATA:  Patient status post fall with right posterior rib and shoulder pain. Initial encounter. EXAM: RIGHT SHOULDER - 2+ VIEW COMPARISON:  Chest radiograph 08/08/2014 FINDINGS: AC and glenohumeral joint degenerative change. No evidence for acute displaced fracture. Normal anatomic alignment. There are multiple age-indeterminate lateral right rib fractures. IMPRESSION: Degenerative changes of the right shoulder without evidence for acute fracture. Multiple age-indeterminate lateral right rib fractures, incompletely evaluated. See dedicated rib radiograph report. Electronically Signed   By: Lovey Newcomer M.D.   On: 04/14/2015 19:29       Assessment & Plan:   Problem List Items Addressed This Visit    Anemia    Saw GI.  Recommended no further w/up.  hgb has been stable.  Follow cbc.   Lab Results  Component Value Date   WBC 7.5 04/14/2015   HGB 12.2 04/14/2015   HCT 37.4 04/14/2015   MCV 97.2 04/14/2015   PLT 191 04/14/2015        GERD (gastroesophageal reflux disease)    Symptoms controlled on omeprazole.  Follow.        Headache    Has seen neurology.  Intermittent.  She relates to sinus.  Restart nasal sprays.  Follow.        Hypertension - Primary    Blood pressure under good control.  Continue same medication regimen.  Follow pressures.  Follow metabolic panel.        Inflammatory arthritis ([place])    Has seen Dr Jefm Bryant.  Follow.  Stable.       Loss of  weight    Weight is up some from the last check.  Follow.        Osteoporosis    Has received reclast.  Continue vitamin d.        Rib pain on right side    Persistent pain with certain movements.  Good breath sounds bilaterally and no pain with deep inspiration.  Tylenol.  Follow.  Previous rib fractures.  Discussed further evaluation.  She declines.        Shoulder pain    Still with pain and limited rom.  Has had injection.  Follow.  Stable.            Einar Pheasant, MD

## 2015-08-29 ENCOUNTER — Encounter: Payer: Self-pay | Admitting: Internal Medicine

## 2015-08-29 NOTE — Assessment & Plan Note (Signed)
Saw GI.  Recommended no further w/up.  hgb has been stable.  Follow cbc.   Lab Results  Component Value Date   WBC 7.5 04/14/2015   HGB 12.2 04/14/2015   HCT 37.4 04/14/2015   MCV 97.2 04/14/2015   PLT 191 04/14/2015

## 2015-08-29 NOTE — Assessment & Plan Note (Signed)
Weight is up some from the last check.  Follow.

## 2015-08-29 NOTE — Assessment & Plan Note (Signed)
Blood pressure under good control.  Continue same medication regimen.  Follow pressures.  Follow metabolic panel.   

## 2015-08-29 NOTE — Assessment & Plan Note (Signed)
Still with pain and limited rom.  Has had injection.  Follow.  Stable.

## 2015-08-29 NOTE — Assessment & Plan Note (Signed)
Has received reclast.  Continue vitamin d.   

## 2015-08-29 NOTE — Assessment & Plan Note (Signed)
Persistent pain with certain movements.  Good breath sounds bilaterally and no pain with deep inspiration.  Tylenol.  Follow.  Previous rib fractures.  Discussed further evaluation.  She declines.

## 2015-08-29 NOTE — Assessment & Plan Note (Signed)
Has seen Dr Jefm Bryant.  Follow.  Stable.

## 2015-08-29 NOTE — Assessment & Plan Note (Signed)
Has seen neurology.  Intermittent.  She relates to sinus.  Restart nasal sprays.  Follow.

## 2015-08-29 NOTE — Assessment & Plan Note (Signed)
Symptoms controlled on omeprazole.  Follow.  

## 2015-08-31 DIAGNOSIS — L84 Corns and callosities: Secondary | ICD-10-CM | POA: Diagnosis not present

## 2015-11-04 DIAGNOSIS — L84 Corns and callosities: Secondary | ICD-10-CM | POA: Diagnosis not present

## 2015-11-04 DIAGNOSIS — G8929 Other chronic pain: Secondary | ICD-10-CM | POA: Diagnosis not present

## 2015-11-04 DIAGNOSIS — M150 Primary generalized (osteo)arthritis: Secondary | ICD-10-CM | POA: Diagnosis not present

## 2015-11-04 DIAGNOSIS — M25511 Pain in right shoulder: Secondary | ICD-10-CM | POA: Diagnosis not present

## 2015-11-04 DIAGNOSIS — M25512 Pain in left shoulder: Secondary | ICD-10-CM | POA: Diagnosis not present

## 2015-11-26 ENCOUNTER — Ambulatory Visit (INDEPENDENT_AMBULATORY_CARE_PROVIDER_SITE_OTHER): Payer: Medicare Other | Admitting: Internal Medicine

## 2015-11-26 ENCOUNTER — Encounter: Payer: Self-pay | Admitting: Internal Medicine

## 2015-11-26 VITALS — BP 120/60 | HR 83 | Temp 97.5°F | Resp 14 | Ht 59.0 in | Wt 107.2 lb

## 2015-11-26 DIAGNOSIS — Z23 Encounter for immunization: Secondary | ICD-10-CM | POA: Diagnosis not present

## 2015-11-26 DIAGNOSIS — D649 Anemia, unspecified: Secondary | ICD-10-CM

## 2015-11-26 DIAGNOSIS — M25519 Pain in unspecified shoulder: Secondary | ICD-10-CM

## 2015-11-26 DIAGNOSIS — M1990 Unspecified osteoarthritis, unspecified site: Secondary | ICD-10-CM | POA: Diagnosis not present

## 2015-11-26 DIAGNOSIS — I10 Essential (primary) hypertension: Secondary | ICD-10-CM | POA: Diagnosis not present

## 2015-11-26 DIAGNOSIS — R634 Abnormal weight loss: Secondary | ICD-10-CM

## 2015-11-26 MED ORDER — PNEUMOCOCCAL 13-VAL CONJ VACC IM SUSP: 0.5000 mL | INTRAMUSCULAR | Status: DC

## 2015-11-26 NOTE — Progress Notes (Signed)
Pre visit review using our clinic review tool, if applicable. No additional management support is needed unless otherwise documented below in the visit note. 

## 2015-11-26 NOTE — Progress Notes (Signed)
Patient ID: Heidi [name], female   DOB: 06-Feb-1918, 82 y.o.   MRN: [id number]   Subjective:    Patient ID: [name], female    DOB: [birth year], 82 y.o.   MRN: [id number]  HPI  Patient here for a scheduled follow up.  She is accompanied by her daughter.  History obtained from both of them.  She is eating.  Weight has increased from the last check.  S/p injection.  Still with some shoulder discomfort.  Overall feels things are stable.  No chest pain.  No sob.  No abdominal pain or cramping.  Bowels stable.     Past Medical History  Diagnosis Date  . Anemia   . Hypertension   . Osteoporosis     vitamin D deficiency, nasal miacalcin, reclast, previous rib fracture  . Asthma   . GERD (gastroesophageal reflux disease)   . Nephrolithiasis   . OA (osteoarthritis)     Left knee replacement, hands, shoulders, cervial spine  . Vitamin D deficiency   . Inflammatory arthritis (HCC)     elevated ESR, negative temporal artery bx, low titer rheumatoid factor  . Pancreatitis     s/p cholecystectomy with ERCP and stone extraction   Past Surgical History  Procedure Laterality Date  . Replacement total knee      Left knee replacement  . Lithotripsy    . Abdominal hysterectomy      secondary  to bleeding   Family History  Problem Relation Age of Onset  . Hypertension Mother   . Stroke Mother   . Stomach cancer Father   . Arthritis Brother   . Lung cancer Brother    Social History   Social History  . Marital Status: Unknown    Spouse Name: N/A  . Number of Children: 5  . Years of Education: N/A   Social History Main Topics  . Smoking status: Never Smoker   . Smokeless tobacco: Never Used  . Alcohol Use: No  . Drug Use: No  . Sexual Activity: Not Asked   Other Topics Concern  . None   Social History Narrative   She is widowed. She has five children, three daughter and two son.    Outpatient Encounter Prescriptions as of 11/26/2015  Medication Sig  .  acetaminophen (TYLENOL) 650 MG CR tablet Take 1,300 mg by mouth 2 (two) times daily.   Marland Kitchen amLODipine (NORVASC) 5 MG tablet Take 1 tablet (5 mg total) by mouth daily.  . Calcium Carbonate-Vitamin D (CALCIUM 600+D) 600-200 MG-UNIT TABS Take 1 tablet by mouth 2 (two) times daily.  . Cholecalciferol (VITAMIN D) 2000 UNITS tablet Take 2,000 Units by mouth daily.  . meloxicam (MOBIC) 7.5 MG tablet Take 7.5 mg by mouth daily.  . metoprolol succinate (TOPROL-XL) 25 MG 24 hr tablet TAKE ONE HALF TABLET DAILY  . montelukast (SINGULAIR) 10 MG tablet Take 1 tablet (10 mg total) by mouth daily.  Marland Kitchen thiamine (VITAMIN B-1) 100 MG tablet Take 100 mg by mouth daily.  . vitamin C (ASCORBIC ACID) 500 MG tablet Take 500 mg by mouth daily.  . vitamin E 400 UNIT capsule Take 400 Units by mouth daily.   Facility-Administered Encounter Medications as of 11/26/2015  Medication  . pneumococcal 13-valent conjugate vaccine (PREVNAR 13) injection 0.5 mL    Review of Systems  Constitutional: Negative for appetite change and unexpected weight change.       Has gained weight.    HENT: Negative for congestion and sinus  pressure.   Respiratory: Negative for cough, chest tightness and shortness of breath.   Cardiovascular: Negative for chest pain, palpitations and leg swelling.  Gastrointestinal: Negative for nausea, vomiting, abdominal pain and diarrhea.  Genitourinary: Negative for dysuria and difficulty urinating.  Musculoskeletal: Negative for myalgias.       Persistent shoulder pain as outlined.   Skin: Negative for color change and rash.  Neurological: Negative for dizziness, light-headedness and headaches.  Psychiatric/Behavioral: Negative for dysphoric mood and agitation.       Objective:    Physical Exam  Constitutional: She appears well-developed and well-nourished. No distress.  HENT:  Nose: Nose normal.  Mouth/Throat: Oropharynx is clear and moist.  Eyes: Conjunctivae are normal. Right eye exhibits no  discharge. Left eye exhibits no discharge.  Neck: Neck supple. No thyromegaly present.  Cardiovascular: Normal rate and regular rhythm.   Pulmonary/Chest: Breath sounds normal. No respiratory distress. She has no wheezes.  Abdominal: Soft. Bowel sounds are normal. There is no tenderness.  Musculoskeletal: She exhibits no edema or tenderness.  Lymphadenopathy:    She has no cervical adenopathy.  Skin: No rash noted. No erythema.  Psychiatric: She has a normal mood and affect. Her behavior is normal.    BP 120/60 mmHg  Pulse 83  Temp(Src) 97.5 F (36.4 C) (Oral)  Resp 14  Ht '4\' 11"'  (1.499 m)  Wt 107 lb 3.2 oz (48.626 kg)  BMI 21.64 kg/m2  SpO2 97% Wt Readings from Last 3 Encounters:  11/26/15 107 lb 3.2 oz (48.626 kg)  08/26/15 105 lb 8 oz (47.854 kg)  06/10/15 101 lb 4 oz (45.927 kg)     Lab Results  Component Value Date   WBC 7.5 04/14/2015   HGB 12.2 04/14/2015   HCT 37.4 04/14/2015   PLT 191 04/14/2015   GLUCOSE 98 04/14/2015   ALT 14 04/08/2015   AST 18 04/08/2015   NA 138 04/14/2015   K 3.6 04/14/2015   CL 101 04/14/2015   CREATININE 0.79 04/14/2015   BUN 19 04/14/2015   CO2 28 04/14/2015   TSH 1.87 04/08/2015   INR 0.9 08/30/2013    Dg Ribs Unilateral Right  04/14/2015  CLINICAL DATA:  Right lower posterior rib pain and right shoulder pain after fall today. EXAM: RIGHT RIBS - 2 VIEW COMPARISON:  08/08/2014 and 08/31/2013 FINDINGS: Lungs are clear. Cardiomediastinal silhouette is within normal. There are degenerative changes of the spine and shoulders. Loose body over the right glenohumeral joint unchanged. There are multiple old posterior and lateral right-sided rib fractures of ribs 3 through approximately 8. No definite acute fractures noted. IMPRESSION: No acute cardiopulmonary disease. Multiple old right-sided rib fractures. No definite acute rib fractures. Electronically Signed   By: Marin Olp M.D.   On: 04/14/2015 19:32   Dg Shoulder Right  04/14/2015   CLINICAL DATA:  Patient status post fall with right posterior rib and shoulder pain. Initial encounter. EXAM: RIGHT SHOULDER - 2+ VIEW COMPARISON:  Chest radiograph 08/08/2014 FINDINGS: AC and glenohumeral joint degenerative change. No evidence for acute displaced fracture. Normal anatomic alignment. There are multiple age-indeterminate lateral right rib fractures. IMPRESSION: Degenerative changes of the right shoulder without evidence for acute fracture. Multiple age-indeterminate lateral right rib fractures, incompletely evaluated. See dedicated rib radiograph report. Electronically Signed   By: Lovey Newcomer M.D.   On: 04/14/2015 19:29       Assessment & Plan:   Problem List Items Addressed This Visit    Anemia    Last  hgb stable.  Follow cbc.       Hypertension    Blood pressure under good control.  Continue same medication regimen.  Follow pressures.  Follow metabolic panel.        Inflammatory arthritis ([place])    Has seen Dr Jefm Bryant.  Stable.  Follow.        Loss of weight    Appetite good.  Weight improved.        Shoulder pain    Persistent.  S/p injection.  Follow.         Other Visit Diagnoses    Need for prophylactic vaccination against Streptococcus pneumoniae (pneumococcus)    -  Primary    Relevant Medications    pneumococcal 13-valent conjugate vaccine (PREVNAR 13) injection 0.5 mL        Einar Pheasant, MD

## 2015-11-28 ENCOUNTER — Encounter: Payer: Self-pay | Admitting: Internal Medicine

## 2015-11-28 NOTE — Assessment & Plan Note (Signed)
Last hgb stable.  Follow cbc.

## 2015-11-28 NOTE — Assessment & Plan Note (Signed)
Persistent.  S/p injection.  Follow.

## 2015-11-28 NOTE — Assessment & Plan Note (Signed)
Blood pressure under good control.  Continue same medication regimen.  Follow pressures.  Follow metabolic panel.   

## 2015-11-28 NOTE — Assessment & Plan Note (Signed)
Has seen Dr Jefm Bryant.  Stable.  Follow.

## 2015-11-28 NOTE — Assessment & Plan Note (Signed)
Appetite good.  Weight improved.

## 2015-12-15 ENCOUNTER — Emergency Department: Payer: Medicare Other

## 2015-12-15 ENCOUNTER — Encounter: Payer: Self-pay | Admitting: Emergency Medicine

## 2015-12-15 ENCOUNTER — Emergency Department
Admission: EM | Admit: 2015-12-15 | Discharge: 2015-12-15 | Disposition: A | Discharge status: Home / Self Care | Payer: Medicare Other | Attending: Emergency Medicine | Admitting: Emergency Medicine

## 2015-12-15 DIAGNOSIS — M19012 Primary osteoarthritis, left shoulder: Secondary | ICD-10-CM | POA: Diagnosis not present

## 2015-12-15 DIAGNOSIS — Z79899 Other long term (current) drug therapy: Secondary | ICD-10-CM | POA: Diagnosis not present

## 2015-12-15 DIAGNOSIS — M19011 Primary osteoarthritis, right shoulder: Secondary | ICD-10-CM | POA: Insufficient documentation

## 2015-12-15 DIAGNOSIS — I10 Essential (primary) hypertension: Secondary | ICD-10-CM | POA: Diagnosis not present

## 2015-12-15 DIAGNOSIS — M810 Age-related osteoporosis without current pathological fracture: Secondary | ICD-10-CM | POA: Diagnosis not present

## 2015-12-15 DIAGNOSIS — M25511 Pain in right shoulder: Secondary | ICD-10-CM | POA: Diagnosis not present

## 2015-12-15 DIAGNOSIS — M25512 Pain in left shoulder: Secondary | ICD-10-CM | POA: Diagnosis not present

## 2015-12-15 DIAGNOSIS — J45909 Unspecified asthma, uncomplicated: Secondary | ICD-10-CM | POA: Diagnosis not present

## 2015-12-15 MED ORDER — MELOXICAM 7.5 MG PO TABS: 7.5000 mg | ORAL_TABLET | Freq: Two times a day (BID) | ORAL | Status: DC

## 2015-12-15 MED ORDER — ACETAMINOPHEN-CODEINE #3 300-30 MG PO TABS: 1.0000 | ORAL_TABLET | Freq: Three times a day (TID) | ORAL | Status: DC | PRN

## 2015-12-15 MED ORDER — ACETAMINOPHEN-CODEINE #3 300-30 MG PO TABS
1.0000 | ORAL_TABLET | Freq: Once | ORAL | Status: AC
  Administered 2015-12-15: 1 via ORAL

## 2015-12-15 MED ORDER — ACETAMINOPHEN-CODEINE #3 300-30 MG PO TABS
ORAL_TABLET | ORAL | Status: AC
  Filled 2015-12-15: qty 1

## 2015-12-15 NOTE — ED Notes (Signed)
Pt presents to ED with bilateral shoulder pain. Pt states heard a popping sound in her right shoulder. Pt states left shoulder has been going in an out of joint. Pt reports pain in shoulders bilaterally.

## 2015-12-15 NOTE — ED Notes (Signed)
States she felt a pop to right shoulder this am  But having pain to both shoulders at present

## 2015-12-15 NOTE — ED Provider Notes (Signed)
[facility] Emergency Department Provider Note  ____________________________________________  Time seen: Approximately 12:43 PM  I have reviewed the triage vital signs and the nursing notes.   HISTORY  Chief Complaint Shoulder Pain    HPI Heidi [name] is a 82 y.o. female presents with complaints of feeling a pop in her right shoulder last night. Denies any direct trauma.. Complains of pain to both shoulders at this time. Rates her pain as a 3/10. Past medical history significant for osteoarthritis inflammatory arthritis and osteoporosis. Only thing taken for pain and his current medications.   Past Medical History  Diagnosis Date  . Anemia   . Hypertension   . Osteoporosis     vitamin D deficiency, nasal miacalcin, reclast, previous rib fracture  . Asthma   . GERD (gastroesophageal reflux disease)   . Nephrolithiasis   . OA (osteoarthritis)     Left knee replacement, hands, shoulders, cervial spine  . Vitamin D deficiency   . Inflammatory arthritis (HCC)     elevated ESR, negative temporal artery bx, low titer rheumatoid factor  . Pancreatitis     s/p cholecystectomy with ERCP and stone extraction    Patient Active Problem List   Diagnosis Date Noted  . Skin lesion of cheek 04/10/2015  . Gas 04/10/2015  . Loss of weight 04/08/2015  . Rib pain on right side 08/16/2014  . Shoulder pain 08/16/2014  . Lower extremity edema 01/20/2014  . Leg pain 01/20/2014  . Rash 09/12/2013  . Urinary frequency 09/12/2013  . Headache 02/19/2013  . Dizziness 02/19/2013  . Inflammatory arthritis ([place]) 08/16/2012  . Osteoarthritis 08/16/2012  . Osteoporosis 08/16/2012  . Anemia 08/16/2012  . GERD (gastroesophageal reflux disease) 08/16/2012  . Hypertension 08/16/2012    Past Surgical History  Procedure Laterality Date  . Replacement total knee      Left knee replacement  . Lithotripsy    . Abdominal hysterectomy      secondary  to bleeding     Current Outpatient Rx  Name  Route  Sig  Dispense  Refill  . acetaminophen (TYLENOL) 650 MG CR tablet   Oral   Take 1,300 mg by mouth 2 (two) times daily.          Marland Kitchen acetaminophen-codeine (TYLENOL #3) 300-30 MG tablet   Oral   Take 1 tablet by mouth every 8 (eight) hours as needed for moderate pain.   15 tablet   0   . amLODipine (NORVASC) 5 MG tablet   Oral   Take 1 tablet (5 mg total) by mouth daily.   90 tablet   3   . Calcium Carbonate-Vitamin D (CALCIUM 600+D) 600-200 MG-UNIT TABS   Oral   Take 1 tablet by mouth 2 (two) times daily.         . Cholecalciferol (VITAMIN D) 2000 UNITS tablet   Oral   Take 2,000 Units by mouth daily.         . meloxicam (MOBIC) 7.5 MG tablet   Oral   Take 1 tablet (7.5 mg total) by mouth 2 (two) times daily.   30 tablet   2   . metoprolol succinate (TOPROL-XL) 25 MG 24 hr tablet      TAKE ONE HALF TABLET DAILY   15 tablet   11   . montelukast (SINGULAIR) 10 MG tablet   Oral   Take 1 tablet (10 mg total) by mouth daily.   30 tablet   11   . thiamine (  VITAMIN B-1) 100 MG tablet   Oral   Take 100 mg by mouth daily.         . vitamin C (ASCORBIC ACID) 500 MG tablet   Oral   Take 500 mg by mouth daily.         . vitamin E 400 UNIT capsule   Oral   Take 400 Units by mouth daily.           Allergies Aspirin; Levaquin; Penicillins; Sulfa antibiotics; Tramadol; Macrobid; and Omnicef  Family History  Problem Relation Age of Onset  . Hypertension Mother   . Stroke Mother   . Stomach cancer Father   . Arthritis Brother   . Lung cancer Brother     Social History Social History  Substance Use Topics  . Smoking status: Never Smoker   . Smokeless tobacco: Never Used  . Alcohol Use: No    Review of Systems Cardiovascular: Denies chest pain. Respiratory: Denies shortness of breath. Musculoskeletal: Positive for bilateral shoulder pain Skin: Negative for rash. No ecchymosis or bruising  noted. Neurological: Negative for headaches, focal weakness or numbness.  10-point ROS otherwise negative.  ____________________________________________   PHYSICAL EXAM:  VITAL SIGNS: ED Triage Vitals  Enc Vitals Group     BP 12/15/15 0901 155/82 mmHg     Pulse Rate 12/15/15 0901 73     Resp 12/15/15 0901 18     Temp 12/15/15 0901 97.6 F (36.4 C)     Temp Source 12/15/15 0901 Oral     SpO2 12/15/15 0901 98 %     Weight 12/15/15 0901 107 lb (48.535 kg)     Height 12/15/15 0901 _0  (1.575 m)     Head Cir --      Peak Flow --      Pain Score 12/15/15 0902 3     Pain Loc --      Pain Edu? --      Excl. in [place]? --     Constitutional: Alert and oriented. Well appearing and in no acute distress. Eyes: Conjunctivae are normal. PERRL. EOMI. Head: Atraumatic. Nose: No congestion/rhinnorhea. Mouth/Throat: Mucous membranes are moist.  Oropharynx non-erythematous. Neck: No stridor.   Cardiovascular: Normal rate, regular rhythm. Grossly normal heart sounds.  Good peripheral circulation. Respiratory: Normal respiratory effort.  No retractions. Lungs CTAB. Gastrointestinal: Soft and nontender. No distention. No abdominal bruits. No CVA tenderness. Musculoskeletal: Tenderness to bilateral shoulders right worse than left. Patient has difficulty with the right shoulder with extending and abduction. Left shoulder much more mobile. No ecchymosis or bruising noted. Neurologic:  Normal speech and language. No gross focal neurologic deficits are appreciated. No gait instability. Skin:  Skin is warm, dry and intact. No rash noted. Psychiatric: Mood and affect are normal. Speech and behavior are normal.  ____________________________________________   LABS (all labs ordered are listed, but only abnormal results are displayed)  Labs Reviewed - No data to display ____________________________________________    RADIOLOGY  No acute osseous findings other than severe osteoarthritis with  some possibly chronic before meals separation of the right shoulder. ____________________________________________   PROCEDURES  Procedure(s) performed: None  Critical Care performed: No  ____________________________________________   INITIAL IMPRESSION / ASSESSMENT AND PLAN / ED COURSE  Pertinent labs & imaging results that were available during my care of the patient were reviewed by me and considered in my medical decision making (see chart for details).  Osteoarthritis of both shoulders with some chronic before meals separation noted to the  right shoulder. Referral to orthopedics on-call Rx given for meloxicam 7.5 mg twice a day and Tylenol 3 one every 8 hours as needed for severe pain. Patient follow-up PCP or return to the ER with any worsening symptomology. ____________________________________________   FINAL CLINICAL IMPRESSION(S) / ED DIAGNOSES  Final diagnoses:  Osteoarthritis of both shoulders, unspecified osteoarthritis type     This chart was dictated using voice recognition software/Dragon. Despite best efforts to proofread, errors can occur which can change the meaning. Any change was purely unintentional.   Arlyss Repress, PA-C 12/15/15 1328

## 2015-12-15 NOTE — Discharge Instructions (Signed)
Heat Therapy Heat therapy can help ease sore, stiff, injured, and tight muscles and joints. Heat relaxes your muscles, which may help ease your pain.  RISKS AND COMPLICATIONS If you have any of the following conditions, do not use heat therapy unless your health care provider has approved: 1. Poor circulation. 2. Healing wounds or scarred skin in the area being treated. 3. Diabetes, heart disease, or high blood pressure. 4. Not being able to feel (numbness) the area being treated. 5. Unusual swelling of the area being treated. 6. Active infections. 7. Blood clots. 8. Cancer. 9. Inability to communicate pain. This may include young children and people who have problems with their brain function (dementia). 10. Pregnancy. Heat therapy should only be used on old, pre-existing, or long-lasting (chronic) injuries. Do not use heat therapy on new injuries unless directed by your health care provider. HOW TO USE HEAT THERAPY There are several different kinds of heat therapy, including: 1. Moist heat pack. 2. Warm water bath. 3. Hot water bottle. 4. Electric heating pad. 5. Heated gel pack. 6. Heated wrap. 7. Electric heating pad. Use the heat therapy method suggested by your health care provider. Follow your health care provider's instructions on when and how to use heat therapy. GENERAL HEAT THERAPY RECOMMENDATIONS 1. Do not sleep while using heat therapy. Only use heat therapy while you are awake. 2. Your skin may turn pink while using heat therapy. Do not use heat therapy if your skin turns red. 3. Do not use heat therapy if you have new pain. 4. High heat or long exposure to heat can cause burns. Be careful when using heat therapy to avoid burning your skin. 5. Do not use heat therapy on areas of your skin that are already irritated, such as with a rash or sunburn. SEEK MEDICAL CARE IF: 1. You have blisters, redness, swelling, or numbness. 2. You have new pain. 3. Your pain is  worse. MAKE SURE YOU: 1. Understand these instructions. 2. Will watch your condition. 3. Will get help right away if you are not doing well or get worse.   This information is not intended to replace advice given to you by your health care provider. Make sure you discuss any questions you have with your health care provider.   Document Released: 11/20/2011 Document Revised: 09/18/2014 Document Reviewed: 10/21/2013 Elsevier Interactive Patient Education 2016 Elsevier Inc.  Osteoarthritis Osteoarthritis is a disease that causes soreness and inflammation of a joint. It occurs when the cartilage at the affected joint wears down. Cartilage acts as a cushion, covering the ends of bones where they meet to form a joint. Osteoarthritis is the most common form of arthritis. It often occurs in older people. The joints affected most often by this condition include those in the: 11. Ends of the fingers. 12. Thumbs. 13. Neck. 14. Lower back. 15. Knees. 16. Hips. CAUSES  Over time, the cartilage that covers the ends of bones begins to wear away. This causes bone to rub on bone, producing pain and stiffness in the affected joints.  RISK FACTORS Certain factors can increase your chances of having osteoarthritis, including: 8. Older age. 9. Excessive body weight. 10. Overuse of joints. 11. Previous joint injury. SIGNS AND SYMPTOMS  6. Pain, swelling, and stiffness in the joint. 7. Over time, the joint may lose its normal shape. 8. Small deposits of bone (osteophytes) may grow on the edges of the joint. 9. Bits of bone or cartilage can break off and float inside the  joint space. This may cause more pain and damage. DIAGNOSIS  Your health care provider will do a physical exam and ask about your symptoms. Various tests may be ordered, such as: 4. X-rays of the affected joint. 5. Blood tests to rule out other types of arthritis. Additional tests may be used to diagnose your condition. TREATMENT   Goals of treatment are to control pain and improve joint function. Treatment plans may include: 4. A prescribed exercise program that allows for rest and joint relief. 5. A weight control plan. 6. Pain relief techniques, such as: 1. Properly applied heat and cold. 2. Electric pulses delivered to nerve endings under the skin (transcutaneous electrical nerve stimulation [TENS]). 3. Massage. 4. Certain nutritional supplements. 7. Medicines to control pain, such as: 1. Acetaminophen. 2. Nonsteroidal anti-inflammatory drugs (NSAIDs), such as naproxen. 3. Narcotic or central-acting agents, such as tramadol. 4. Corticosteroids. These can be given orally or as an injection. 8. Surgery to reposition the bones and relieve pain (osteotomy) or to remove loose pieces of bone and cartilage. Joint replacement may be needed in advanced states of osteoarthritis. HOME CARE INSTRUCTIONS  1. Take medicines only as directed by your health care provider. 2. Maintain a healthy weight. Follow your health care provider's instructions for weight control. This may include dietary instructions. 3. Exercise as directed. Your health care provider can recommend specific types of exercise. These may include: 1. Strengthening exercises. These are done to strengthen the muscles that support joints affected by arthritis. They can be performed with weights or with exercise bands to add resistance. 2. Aerobic activities. These are exercises, such as brisk walking or low-impact aerobics, that get your heart pumping. 3. Range-of-motion activities. These keep your joints limber. 4. Balance and agility exercises. These help you maintain daily living skills. 4. Rest your affected joints as directed by your health care provider. 5. Keep all follow-up visits as directed by your health care provider. SEEK MEDICAL CARE IF:  1. Your skin turns red. 2. You develop a rash in addition to your joint pain. 3. You have worsening joint  pain. 4. You have a fever along with joint or muscle aches. SEEK IMMEDIATE MEDICAL CARE IF:  You have a significant loss of weight or appetite.  You have night sweats. [place] of Arthritis and Musculoskeletal and Skin Diseases: www.niams.SouthExposed.es  [employer] on Aging: http://kim-miller.com/  American College of Rheumatology: www.rheumatology.org   This information is not intended to replace advice given to you by your health care provider. Make sure you discuss any questions you have with your health care provider.   Document Released: 08/28/2005 Document Revised: 09/18/2014 Document Reviewed: 05/05/2013 Elsevier Interactive Patient Education 2016 Elsevier Inc.  Shoulder Range of Motion Exercises Shoulder range of motion (ROM) exercises are designed to keep the shoulder moving freely. They are often recommended for people who have shoulder pain. MOVEMENT EXERCISE When you are able, do this exercise 5-6 days per week, or as told by your health care provider. Work toward doing 2 sets of 10 swings. Pendulum Exercise How To Do This Exercise Lying Down 17. Lie face-down on a bed with your abdomen close to the side of the bed. 18. Let your arm hang over the side of the bed. 19. Relax your shoulder, arm, and hand. 20. Slowly and gently swing your arm forward and back. Do not use your neck muscles to swing your arm. They should be relaxed. If you are struggling to swing your  arm, have someone gently swing it for you. When you do this exercise for the first time, swing your arm at a 15 degree angle for 15 seconds, or swing your arm 10 times. As pain lessens over time, increase the angle of the swing to 30-45 degrees. 21. Repeat steps 1-4 with the other arm. How To Do This Exercise While Standing 12. Stand next to a sturdy chair or table and hold on to it with your hand.  Bend forward at the waist.  Bend your knees slightly.  Relax your other arm and let  it hang limp.  Relax the shoulder blade of the arm that is hanging and let it drop.  While keeping your shoulder relaxed, use body motion to swing your arm in small circles. The first time you do this exercise, swing your arm for about 30 seconds or 10 times. When you do it next time, swing your arm for a little longer.  Stand up tall and relax.  Repeat steps 1-7, this time changing the direction of the circles. 13. Repeat steps 1-8 with the other arm. STRETCHING EXERCISES Do these exercises 3-4 times per day on 5-6 days per week or as told by your health care provider. Work toward holding the stretch for 20 seconds. Stretching Exercise 1 10. Lift your arm straight out in front of you. 17. Bend your arm 90 degrees at the elbow (right angle) so your forearm goes across your body and looks like the letter "L." 12. Use your other arm to gently pull the elbow forward and across your body. 13. Repeat steps 1-3 with the other arm. Stretching Exercise 2 You will need a towel or rope for this exercise. 6. Bend one arm behind your back with the palm facing outward. 7. Hold a towel with your other hand. 8. Reach the arm that holds the towel above your head, and bend that arm at the elbow. Your wrist should be behind your neck. 9. Use your free hand to grab the free end of the towel. 10. With the higher hand, gently pull the towel up behind you. 11. With the lower hand, pull the towel down behind you. 12. Repeat steps 1-6 with the other arm. STRENGTHENING EXERCISES Do each of these exercises at four different times of day (sessions) every day or as told by your health care provider. To begin with, repeat each exercise 5 times (repetitions). Work toward doing 3 sets of 12 repetitions or as told by your health care provider. Strengthening Exercise 1 You will need a light weight for this activity. As you grow stronger, you may use a heavier weight. 9. Standing with a weight in your hand, lift your  arm straight out to the side until it is at the same height as your shoulder. [place] your arm at 90 degrees so that your fingers are pointing to the ceiling. 11. Slowly raise your hand until your arm is straight up in the air. 12. Repeat steps 1-3 with the other arm. Strengthening Exercise 2 You will need a light weight for this activity. As you grow stronger, you may use a heavier weight. 6. Standing with a weight in your hand, gradually move your straight arm in an arc, starting at your side, then out in front of you, then straight up over your head. 7. Gradually move your other arm in an arc, starting at your side, then out in front of you, then straight up over your head. 8. Repeat steps 1-2  with the other arm. Strengthening Exercise 3 You will need an elastic band for this activity. As you grow stronger, gradually increase the size of the bands or increase the number of bands that you use at one time. 5. While standing, hold an elastic band in one hand and raise that arm up in the air. 6. With your other hand, pull down the band until that hand is by your side. 7. Repeat steps 1-2 with the other arm.   This information is not intended to replace advice given to you by your health care provider. Make sure you discuss any questions you have with your health care provider.   Document Released: 05/27/2003 Document Revised: 01/12/2015 Document Reviewed: 08/24/2014 Elsevier Interactive Patient Education [employer].

## 2016-01-10 DIAGNOSIS — L84 Corns and callosities: Secondary | ICD-10-CM | POA: Diagnosis not present

## 2016-02-01 ENCOUNTER — Ambulatory Visit (INDEPENDENT_AMBULATORY_CARE_PROVIDER_SITE_OTHER): Payer: Medicare Other | Admitting: Family

## 2016-02-01 ENCOUNTER — Emergency Department: Payer: Medicare Other

## 2016-02-01 ENCOUNTER — Emergency Department
Admission: EM | Admit: 2016-02-01 | Discharge: 2016-02-01 | Disposition: A | Discharge status: Home / Self Care | Payer: Medicare Other | Attending: Emergency Medicine | Admitting: Emergency Medicine

## 2016-02-01 ENCOUNTER — Encounter: Payer: Self-pay | Admitting: Emergency Medicine

## 2016-02-01 ENCOUNTER — Encounter: Payer: Self-pay | Admitting: Family

## 2016-02-01 VITALS — BP 124/70 | HR 74 | Temp 98.4°F | Ht 62.0 in | Wt 104.8 lb

## 2016-02-01 DIAGNOSIS — S299XXA Unspecified injury of thorax, initial encounter: Secondary | ICD-10-CM | POA: Diagnosis not present

## 2016-02-01 DIAGNOSIS — M1990 Unspecified osteoarthritis, unspecified site: Secondary | ICD-10-CM | POA: Insufficient documentation

## 2016-02-01 DIAGNOSIS — J45909 Unspecified asthma, uncomplicated: Secondary | ICD-10-CM | POA: Diagnosis not present

## 2016-02-01 DIAGNOSIS — L299 Pruritus, unspecified: Secondary | ICD-10-CM | POA: Diagnosis not present

## 2016-02-01 DIAGNOSIS — Z79899 Other long term (current) drug therapy: Secondary | ICD-10-CM | POA: Insufficient documentation

## 2016-02-01 DIAGNOSIS — I10 Essential (primary) hypertension: Secondary | ICD-10-CM | POA: Diagnosis not present

## 2016-02-01 DIAGNOSIS — M129 Arthropathy, unspecified: Secondary | ICD-10-CM

## 2016-02-01 DIAGNOSIS — S2090XA Unspecified superficial injury of unspecified parts of thorax, initial encounter: Secondary | ICD-10-CM | POA: Diagnosis present

## 2016-02-01 DIAGNOSIS — M810 Age-related osteoporosis without current pathological fracture: Secondary | ICD-10-CM | POA: Insufficient documentation

## 2016-02-01 DIAGNOSIS — Y929 Unspecified place or not applicable: Secondary | ICD-10-CM | POA: Insufficient documentation

## 2016-02-01 DIAGNOSIS — R0781 Pleurodynia: Secondary | ICD-10-CM | POA: Diagnosis not present

## 2016-02-01 DIAGNOSIS — M19019 Primary osteoarthritis, unspecified shoulder: Secondary | ICD-10-CM

## 2016-02-01 DIAGNOSIS — Y9301 Activity, walking, marching and hiking: Secondary | ICD-10-CM | POA: Diagnosis not present

## 2016-02-01 DIAGNOSIS — W01198A Fall on same level from slipping, tripping and stumbling with subsequent striking against other object, initial encounter: Secondary | ICD-10-CM | POA: Diagnosis not present

## 2016-02-01 DIAGNOSIS — Y999 Unspecified external cause status: Secondary | ICD-10-CM | POA: Insufficient documentation

## 2016-02-01 DIAGNOSIS — S20212A Contusion of left front wall of thorax, initial encounter: Secondary | ICD-10-CM | POA: Insufficient documentation

## 2016-02-01 DIAGNOSIS — S4992XA Unspecified injury of left shoulder and upper arm, initial encounter: Secondary | ICD-10-CM | POA: Diagnosis not present

## 2016-02-01 DIAGNOSIS — W19XXXA Unspecified fall, initial encounter: Secondary | ICD-10-CM

## 2016-02-01 DIAGNOSIS — M25512 Pain in left shoulder: Secondary | ICD-10-CM | POA: Diagnosis not present

## 2016-02-01 MED ORDER — IBUPROFEN 400 MG PO TABS
400.0000 mg | ORAL_TABLET | Freq: Once | ORAL | Status: AC
  Administered 2016-02-01: 400 mg via ORAL

## 2016-02-01 MED ORDER — IBUPROFEN 400 MG PO TABS
ORAL_TABLET | ORAL | Status: AC
  Administered 2016-02-01: 400 mg via ORAL
  Filled 2016-02-01: qty 1

## 2016-02-01 MED ORDER — DICLOFENAC SODIUM 1 % TD GEL: 4.0000 g | Freq: Four times a day (QID) | TRANSDERMAL | Status: DC

## 2016-02-01 NOTE — Patient Instructions (Signed)
Trial Voltaren gel for shoulder arthritis.  Use over-the-counter anti-itch cream such as Benadryl for itching.  If there is no improvement in your symptoms, or if there is any worsening of symptoms, or if you have any additional concerns, please return for re-evaluation; or, if we are closed, consider going to the Emergency Room for evaluation if symptoms urgent.

## 2016-02-01 NOTE — ED Notes (Signed)
MD Williams at bedside.  

## 2016-02-01 NOTE — ED Notes (Signed)
Pt presents to ED with c/o left sided rib pain after she fell around 1730 today. Pt daughter reports pt was walking through a doorway and her pocketbook and purse got caught in between her legs causing her trip and fall. The umbrella hit her in the left side of her ribs causing bruising. Hx of broken ribs. No increased work of breathing noted at this time. Denies hitting her head during the fall.

## 2016-02-01 NOTE — Progress Notes (Signed)
Pre visit review using our clinic review tool, if applicable. No additional management support is needed unless otherwise documented below in the visit note. 

## 2016-02-01 NOTE — Discharge Instructions (Signed)
Blunt Chest Trauma Blunt chest trauma is an injury caused by a blow to the chest. These chest injuries can be very painful. Blunt chest trauma often results in bruised or broken (fractured) ribs. Most cases of bruised and fractured ribs from blunt chest traumas get better after 1 to 3 weeks of rest and pain medicine. Often, the soft tissue in the chest wall is also injured, causing pain and bruising. Internal organs, such as the heart and lungs, may also be injured. Blunt chest trauma can lead to serious medical problems. This injury requires immediate medical care. CAUSES   Motor vehicle collisions.  Falls.  Physical violence.  Sports injuries. SYMPTOMS   Chest pain. The pain may be worse when you move or breathe deeply.  Shortness of breath.  Lightheadedness.  Bruising.  Tenderness.  Swelling. DIAGNOSIS  Your caregiver will do a physical exam. X-rays may be taken to look for fractures. However, minor rib fractures may not show up on X-rays until a few days after the injury. If a more serious injury is suspected, further imaging tests may be done. This may include ultrasounds, computed tomography (CT) scans, or magnetic resonance imaging (MRI). TREATMENT  Treatment depends on the severity of your injury. Your caregiver may prescribe pain medicines and deep breathing exercises. HOME CARE INSTRUCTIONS  Limit your activities until you can move around without much pain.  Do not do any strenuous work until your injury is healed.  Put ice on the injured area.  Put ice in a plastic bag.  Place a towel between your skin and the bag.  Leave the ice on for 15-20 minutes, 03-04 times a day.  You may wear a rib belt as directed by your caregiver to reduce pain.  Practice deep breathing as directed by your caregiver to keep your lungs clear.  Only take over-the-counter or prescription medicines for pain, fever, or discomfort as directed by your caregiver. SEEK IMMEDIATE MEDICAL  CARE IF:   You have increasing pain or shortness of breath.  You cough up blood.  You have nausea, vomiting, or abdominal pain.  You have a fever.  You feel dizzy, weak, or you faint. MAKE SURE YOU:  Understand these instructions.  Will watch your condition.  Will get help right away if you are not doing well or get worse.   This information is not intended to replace advice given to you by your health care provider. Make sure you discuss any questions you have with your health care provider.   Document Released: 10/05/2004 Document Revised: 09/18/2014 Document Reviewed: 02/24/2015 Elsevier Interactive Patient Education 2016 [place].  Rib Contusion A rib contusion is a deep bruise on your rib area. Contusions are the result of a blunt trauma that causes bleeding and injury to the tissues under the skin. A rib contusion may involve bruising of the ribs and of the skin and muscles in the area. The skin overlying the contusion may turn blue, purple, or yellow. Minor injuries will give you a painless contusion, but more severe contusions may stay painful and swollen for a few weeks. CAUSES  A contusion is usually caused by a blow, trauma, or direct force to an area of the body. This often occurs while playing contact sports. SYMPTOMS  Swelling and redness of the injured area.  Discoloration of the injured area.  Tenderness and soreness of the injured area.  Pain with or without movement. DIAGNOSIS  The diagnosis can be made by taking a medical history and  performing a physical exam. An X-ray, CT scan, or MRI may be needed to determine if there were any associated injuries, such as broken bones (fractures) or internal injuries. TREATMENT  Often, the best treatment for a rib contusion is rest. Icing or applying cold compresses to the injured area may help reduce swelling and inflammation. Deep breathing exercises may be recommended to reduce the risk of partial lung collapse  and pneumonia. Over-the-counter or prescription medicines may also be recommended for pain control. HOME CARE INSTRUCTIONS   Apply ice to the injured area:  Put ice in a plastic bag.  Place a towel between your skin and the bag.  Leave the ice on for 20 minutes, 2-3 times per day.  Take medicines only as directed by your health care provider.  Rest the injured area. Avoid strenuous activity and any activities or movements that cause pain. Be careful during activities and avoid bumping the injured area.  Perform deep-breathing exercises as directed by your health care provider.  Do not lift anything that is heavier than 5 lb (2.3 kg) until your health care provider approves.  Do not use any tobacco products, including cigarettes, chewing tobacco, or electronic cigarettes. If you need help quitting, ask your health care provider. SEEK MEDICAL CARE IF:   You have increased bruising or swelling.  You have pain that is not controlled with treatment.  You have a fever. SEEK IMMEDIATE MEDICAL CARE IF:   You have difficulty breathing or shortness of breath.  You develop a continual cough, or you cough up thick or bloody sputum.  You feel sick to your stomach (nauseous), you throw up (vomit), or you have abdominal pain.   This information is not intended to replace advice given to you by your health care provider. Make sure you discuss any questions you have with your health care provider.   Document Released: 05/23/2001 Document Revised: 09/18/2014 Document Reviewed: 06/09/2014 Elsevier Interactive Patient Education [employer].

## 2016-02-01 NOTE — ED Provider Notes (Signed)
[facility] Emergency Department Provider Note        Time seen: ----------------------------------------- 9:09 PM on 02/01/2016 -----------------------------------------    I have reviewed the triage vital signs and the nursing notes.   HISTORY  Chief Complaint Fall and Rib Injury    HPI Heidi HERNAN is a 82 y.o. female who presents ER with left-sided rib pain after she fell around 5:30 today. Daughter reports she was walking through a doorway and her pocketbook and purse got caught between her legs causing her to trip and fall. Umbrella struck her left side causing her ribs to be sore. She does have a remote history of rib fracture.   Past Medical History  Diagnosis Date  . Anemia   . Hypertension   . Osteoporosis     vitamin D deficiency, nasal miacalcin, reclast, previous rib fracture  . Asthma   . GERD (gastroesophageal reflux disease)   . Nephrolithiasis   . OA (osteoarthritis)     Left knee replacement, hands, shoulders, cervial spine  . Vitamin D deficiency   . Inflammatory arthritis (HCC)     elevated ESR, negative temporal artery bx, low titer rheumatoid factor  . Pancreatitis     s/p cholecystectomy with ERCP and stone extraction    Patient Active Problem List   Diagnosis Date Noted  . Skin lesion of cheek 04/10/2015  . Gas 04/10/2015  . Loss of weight 04/08/2015  . Rib pain on right side 08/16/2014  . Shoulder pain 08/16/2014  . Lower extremity edema 01/20/2014  . Leg pain 01/20/2014  . Rash 09/12/2013  . Urinary frequency 09/12/2013  . Headache 02/19/2013  . Dizziness 02/19/2013  . Inflammatory arthritis ([place]) 08/16/2012  . Osteoarthritis 08/16/2012  . Osteoporosis 08/16/2012  . Anemia 08/16/2012  . GERD (gastroesophageal reflux disease) 08/16/2012  . Hypertension 08/16/2012    Past Surgical History  Procedure Laterality Date  . Replacement total knee      Left knee replacement  . Lithotripsy    .  Abdominal hysterectomy      secondary  to bleeding    Allergies Aspirin; Levaquin; Penicillins; Sulfa antibiotics; Tramadol; Macrobid; and Omnicef  Social History Social History  Substance Use Topics  . Smoking status: Never Smoker   . Smokeless tobacco: Never Used  . Alcohol Use: No    Review of Systems Constitutional: Negative for fever. Cardiovascular: Negative for chest pain. Respiratory: Negative for shortness of breath. Gastrointestinal: Negative for abdominal pain, vomiting and diarrhea. Musculoskeletal: Positive for left-sided rib pain Skin: Positive for contusion Neurological: Negative for headaches, focal weakness or numbness. ____________________________________________   PHYSICAL EXAM:  VITAL SIGNS: ED Triage Vitals  Enc Vitals Group     BP 02/01/16 1954 179/71 mmHg     Pulse Rate 02/01/16 1954 79     Resp 02/01/16 1954 20     Temp 02/01/16 1954 98.1 F (36.7 C)     Temp Source 02/01/16 1954 Oral     SpO2 02/01/16 1954 100 %     Weight 02/01/16 1954 104 lb (47.174 kg)     Height 02/01/16 1954 '5\' 2"'  (1.575 m)     Head Cir --      Peak Flow --      Pain Score 02/01/16 1955 5     Pain Loc --      Pain Edu? --      Excl. in [place]? --     Constitutional: Alert and oriented. Well appearing and in no distress.  Eyes: Conjunctivae are normal. Normal extraocular movements. ENT   Head: Normocephalic and atraumatic.   Nose: No congestion/rhinnorhea.   Mouth/Throat: Mucous membranes are moist.   Neck: No stridor. Cardiovascular: Normal rate, regular rhythm. No murmurs, rubs, or gallops. Respiratory: Normal respiratory effort without tachypnea nor retractions. Breath sounds are clear and equal bilaterally. No wheezes/rales/rhonchi. Gastrointestinal: Soft and nontender. Normal bowel sounds Musculoskeletal: Mild left inferior/anterior rib tenderness Neurologic:  Normal speech and language. No gross focal neurologic deficits are appreciated.  Skin:   Skin is warm, dry and intact. Small contusion is noted to the left chest ____________________________________________  ED COURSE:  Pertinent labs & imaging results that were available during my care of the patient were reviewed by me and considered in my medical decision making (see chart for details). Patient presents in no acute distress, we will obtain basic x-rays and reevaluate. ____________________________________________   RADIOLOGY Images were viewed by me  Rib x-rays, left shoulder x-rays IMPRESSION: No acute displaced rib fracture seen. Mild apparent underlying chronic rib deformities noted bilaterally. IMPRESSION: 1. No evidence of fracture or dislocation. 2. 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 ____________________________________________  FINAL ASSESSMENT AND PLAN  Fall, rib contusion  Plan: Patient with imaging as dictated above. Patient is in no acute distress, we have started her on ibuprofen here. Advised ibuprofen 3 times daily as needed for pain. She is stable for discharge.   Earleen Newport, MD   Note: This dictation was prepared with Dragon dictation. Any transcriptional errors that result from this process are unintentional   Earleen Newport, MD 02/01/16 2112

## 2016-02-01 NOTE — Progress Notes (Signed)
Subjective:    Patient ID: Heidi [name], female    DOB: 08-12-1918, 82 y.o.   MRN: [id number]   Heidi [name] is a 82 y.o. female who presents today for an acute visit.    HPI Comments: Patient here for evaluation of right inverted nipple. Patient accompanied by daughter. She notes a "knot" in right breast which is painful. She also describes bilateral breast itching. No history of breast cancer. Denies fever, chills, night sweats, unintentional weight loss.  Per chart review, last mammogram 2015 did not reveal any masses.  Past Medical History  Diagnosis Date  . Anemia   . Hypertension   . Osteoporosis     vitamin D deficiency, nasal miacalcin, reclast, previous rib fracture  . Asthma   . GERD (gastroesophageal reflux disease)   . Nephrolithiasis   . OA (osteoarthritis)     Left knee replacement, hands, shoulders, cervial spine  . Vitamin D deficiency   . Inflammatory arthritis (HCC)     elevated ESR, negative temporal artery bx, low titer rheumatoid factor  . Pancreatitis     s/p cholecystectomy with ERCP and stone extraction   Allergies: Aspirin; Levaquin; Penicillins; Sulfa antibiotics; Tramadol; Macrobid; and Omnicef Current Outpatient Prescriptions on File Prior to Visit  Medication Sig Dispense Refill  . acetaminophen (TYLENOL) 650 MG CR tablet Take 1,300 mg by mouth 2 (two) times daily.     Marland Kitchen acetaminophen-codeine (TYLENOL #3) 300-30 MG tablet Take 1 tablet by mouth every 8 (eight) hours as needed for moderate pain. 15 tablet 0  . amLODipine (NORVASC) 5 MG tablet Take 1 tablet (5 mg total) by mouth daily. 90 tablet 3  . Calcium Carbonate-Vitamin D (CALCIUM 600+D) 600-200 MG-UNIT TABS Take 1 tablet by mouth 2 (two) times daily.    . Cholecalciferol (VITAMIN D) 2000 UNITS tablet Take 2,000 Units by mouth daily.    . meloxicam (MOBIC) 7.5 MG tablet Take 1 tablet (7.5 mg total) by mouth 2 (two) times daily. (Patient taking differently: Take 15 mg by mouth  daily. ) 30 tablet 2  . metoprolol succinate (TOPROL-XL) 25 MG 24 hr tablet TAKE ONE HALF TABLET DAILY 15 tablet 11  . montelukast (SINGULAIR) 10 MG tablet Take 1 tablet (10 mg total) by mouth daily. 30 tablet 11  . thiamine (VITAMIN B-1) 100 MG tablet Take 100 mg by mouth daily.    . vitamin C (ASCORBIC ACID) 500 MG tablet Take 500 mg by mouth daily.    . vitamin E 400 UNIT capsule Take 400 Units by mouth daily.     Current Facility-Administered Medications on File Prior to Visit  Medication Dose Route Frequency Provider Last Rate Last Dose  . pneumococcal 13-valent conjugate vaccine (PREVNAR 13) injection 0.5 mL  0.5 mL Intramuscular Tomorrow-1000 Einar Pheasant, MD        Social History  Substance Use Topics  . Smoking status: Never Smoker   . Smokeless tobacco: Never Used  . Alcohol Use: No    Review of Systems  Constitutional: Negative for fever, chills and unexpected weight change.  Respiratory: Negative for cough.   Cardiovascular: Negative for chest pain and palpitations.  Gastrointestinal: Negative for nausea and vomiting.      Objective:    BP 124/70 mmHg  Pulse 74  Temp(Src) 98.4 F (36.9 C) (Oral)  Ht '5\' 2"'  (1.575 m)  Wt 104 lb 12.8 oz (47.537 kg)  BMI 19.16 kg/m2  SpO2 98%   Physical Exam  Constitutional: She appears well-developed  and well-nourished.  Eyes: Conjunctivae are normal.  Cardiovascular: Normal rate, regular rhythm, normal heart sounds and normal pulses.   Pulmonary/Chest: Effort normal and breath sounds normal. She has no wheezes. She has no rhonchi. She has no rales. Right breast exhibits inverted nipple. Right breast exhibits no mass, no nipple discharge, no skin change and no tenderness. Left breast exhibits no inverted nipple, no mass, no nipple discharge, no skin change and no tenderness.    Left <2cm  erythematous macule noted below left breast. No discharge. No masses identified with CBE.   No skin breakdown, erythema, increased warmth  of breasts.   Neurological: She is alert.  Skin: Skin is warm and dry.  Psychiatric: She has a normal mood and affect. Her speech is normal and behavior is normal. Thought content normal.  Vitals reviewed.      Assessment & Plan:   1. Shoulder arthritis Stable Chronic. Trial of topical NSAID for pain relief.   - diclofenac sodium (VOLTAREN) 1 % GEL; Apply 4 g topically 4 (four) times daily.  Dispense: 1 Tube; Refill: 3  2. Itching Working diagnosis of itching of bilateral breast is nonspecific at this time however I did see one macule below the left breast where patient described her itching. I suspect an insect bite and recommended a topical antihistamine. There is no skin breakdown, erythema to suggest cellulitis or other bacterial infection  3. Inverted nipple, right No masses were found in CPE. However, as I discussed with patient and daughter, inverted nipple can be a sign of breast malignancy. Both daughter and patient politely declined any imaging of breast at this time as they would take no action for breast cancer at her age.    I am having Ms. Lightsey maintain her Calcium Carbonate-Vitamin D, vitamin C, vitamin E, thiamine, acetaminophen, Vitamin D, amLODipine, metoprolol succinate, montelukast, meloxicam, and acetaminophen-codeine. We will continue to administer pneumococcal 13-valent conjugate vaccine.   No orders of the defined types were placed in this encounter.     Start medications as prescribed and explained to patient on After Visit Summary ( AVS). Risks, benefits, and alternatives of the medications and treatment plan prescribed today were discussed, and patient expressed understanding.   Education regarding symptom management and diagnosis given to patient.   Follow-up:Plan follow-up as discussed or as needed if any worsening symptoms or change in condition.   Continue to follow with Einar Pheasant, MD for routine health maintenance.   Heidi [name] and I agreed with plan.   Mable Paris, FNP

## 2016-03-07 DIAGNOSIS — L97521 Non-pressure chronic ulcer of other part of left foot limited to breakdown of skin: Secondary | ICD-10-CM | POA: Diagnosis not present

## 2016-03-07 DIAGNOSIS — B351 Tinea unguium: Secondary | ICD-10-CM | POA: Diagnosis not present

## 2016-03-07 DIAGNOSIS — M79674 Pain in right toe(s): Secondary | ICD-10-CM | POA: Diagnosis not present

## 2016-03-27 DIAGNOSIS — G8929 Other chronic pain: Secondary | ICD-10-CM | POA: Diagnosis not present

## 2016-03-27 DIAGNOSIS — M150 Primary generalized (osteo)arthritis: Secondary | ICD-10-CM | POA: Diagnosis not present

## 2016-03-27 DIAGNOSIS — M25512 Pain in left shoulder: Secondary | ICD-10-CM | POA: Diagnosis not present

## 2016-03-27 DIAGNOSIS — M25511 Pain in right shoulder: Secondary | ICD-10-CM | POA: Diagnosis not present

## 2016-04-03 IMAGING — CR DG RIBS 2V*R*
1 series · 3 of 3 positions shown · non-contrast
Comparison: Prior chest x-ray and right rib series [DATE] 09/30/2012

CLINICAL DATA: [AGE] female with right rib pain after falling
earlier today.

EXAM:
RIGHT RIBS - 2 VIEW

[Series 1: dxr ribs right unilateral · 0.14mm/px · 3 of 3 slices shown]
[im 1/3]
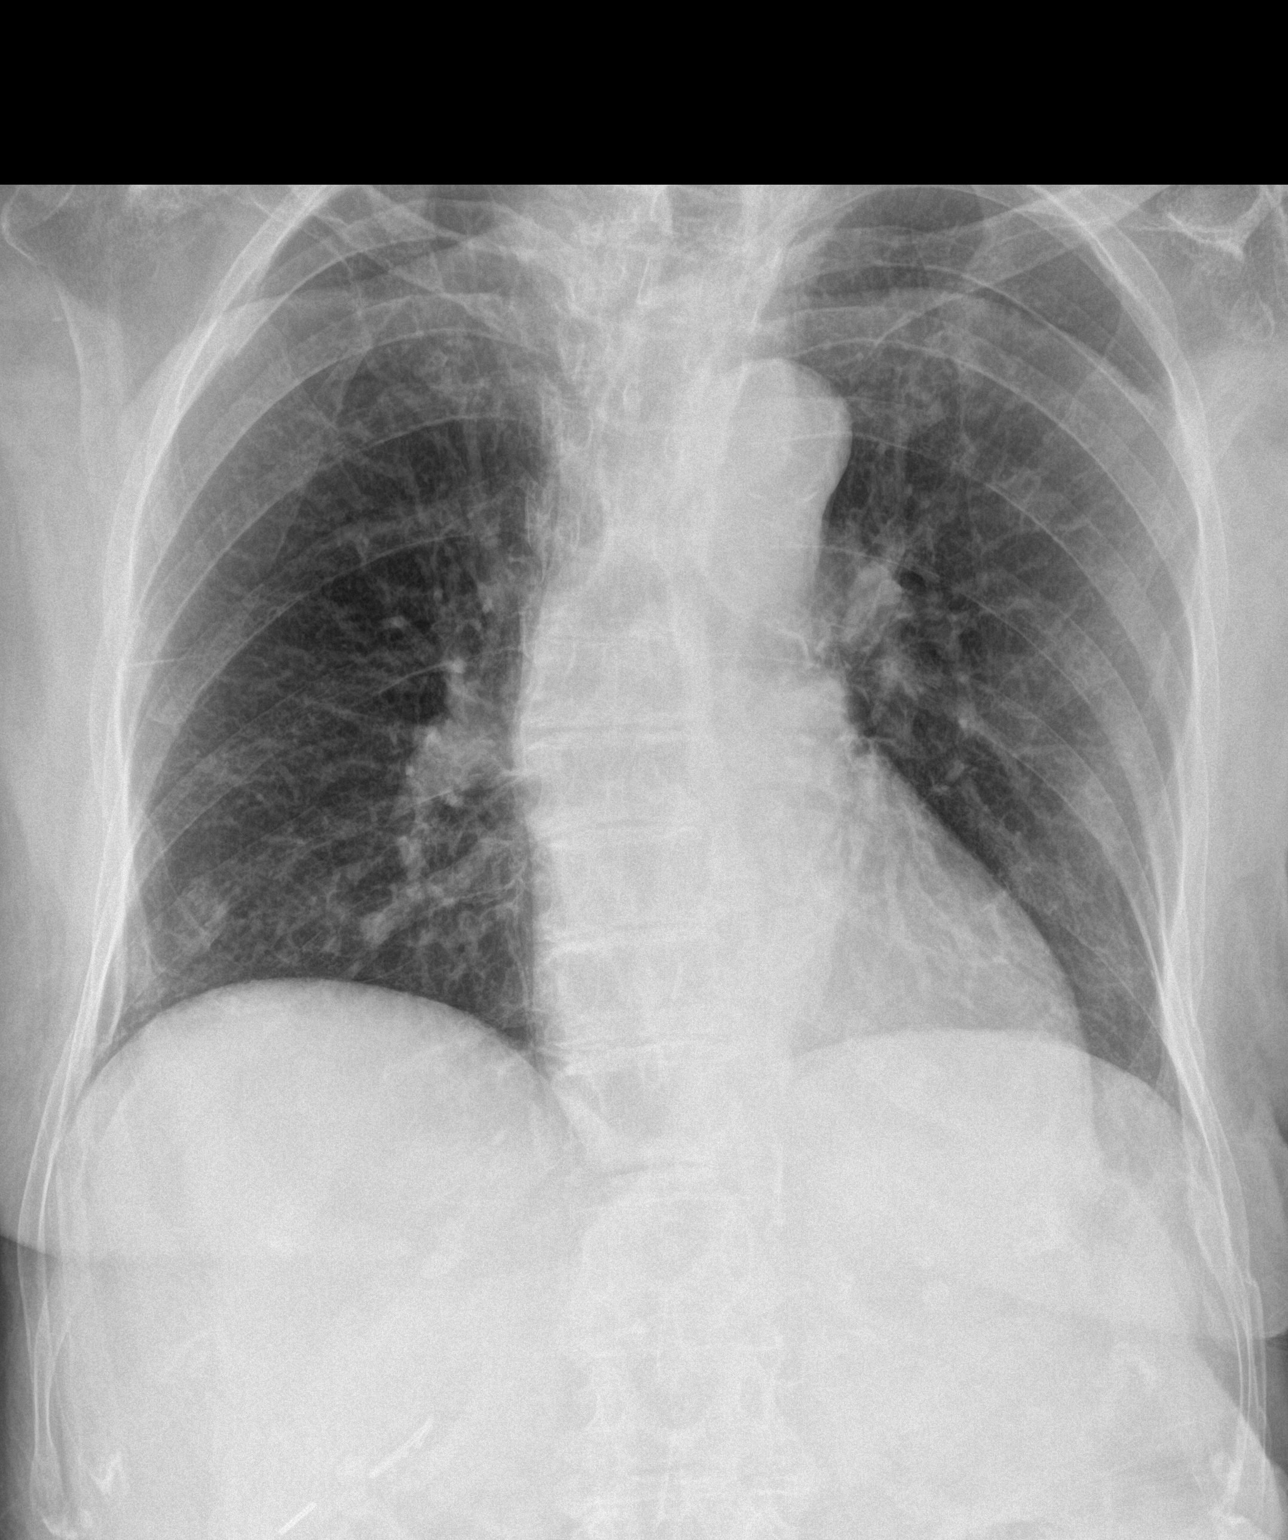
[im 2/3]
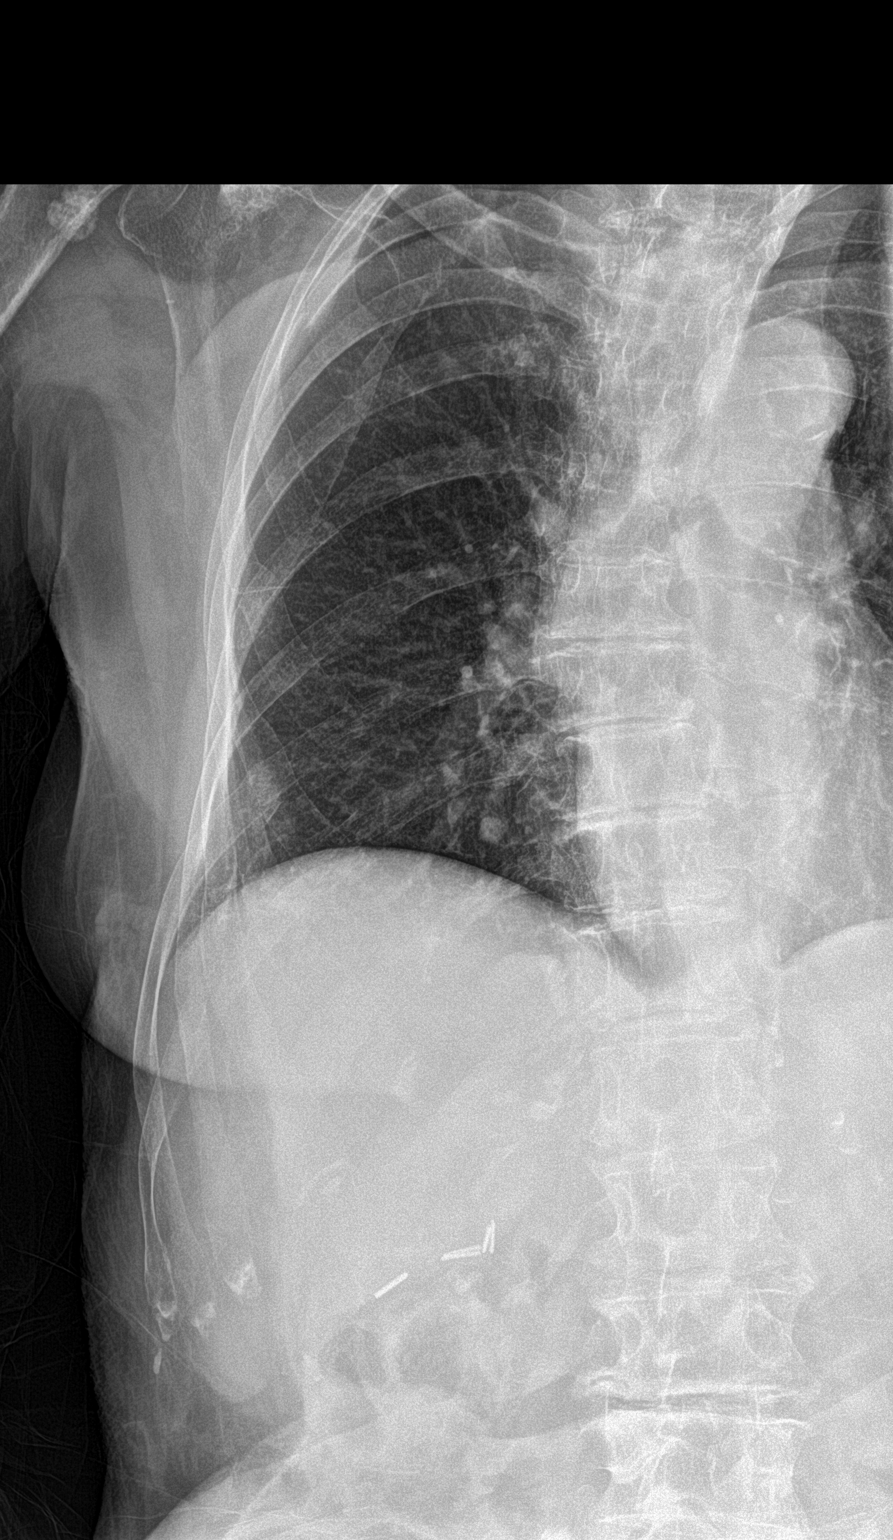
[im 3/3]
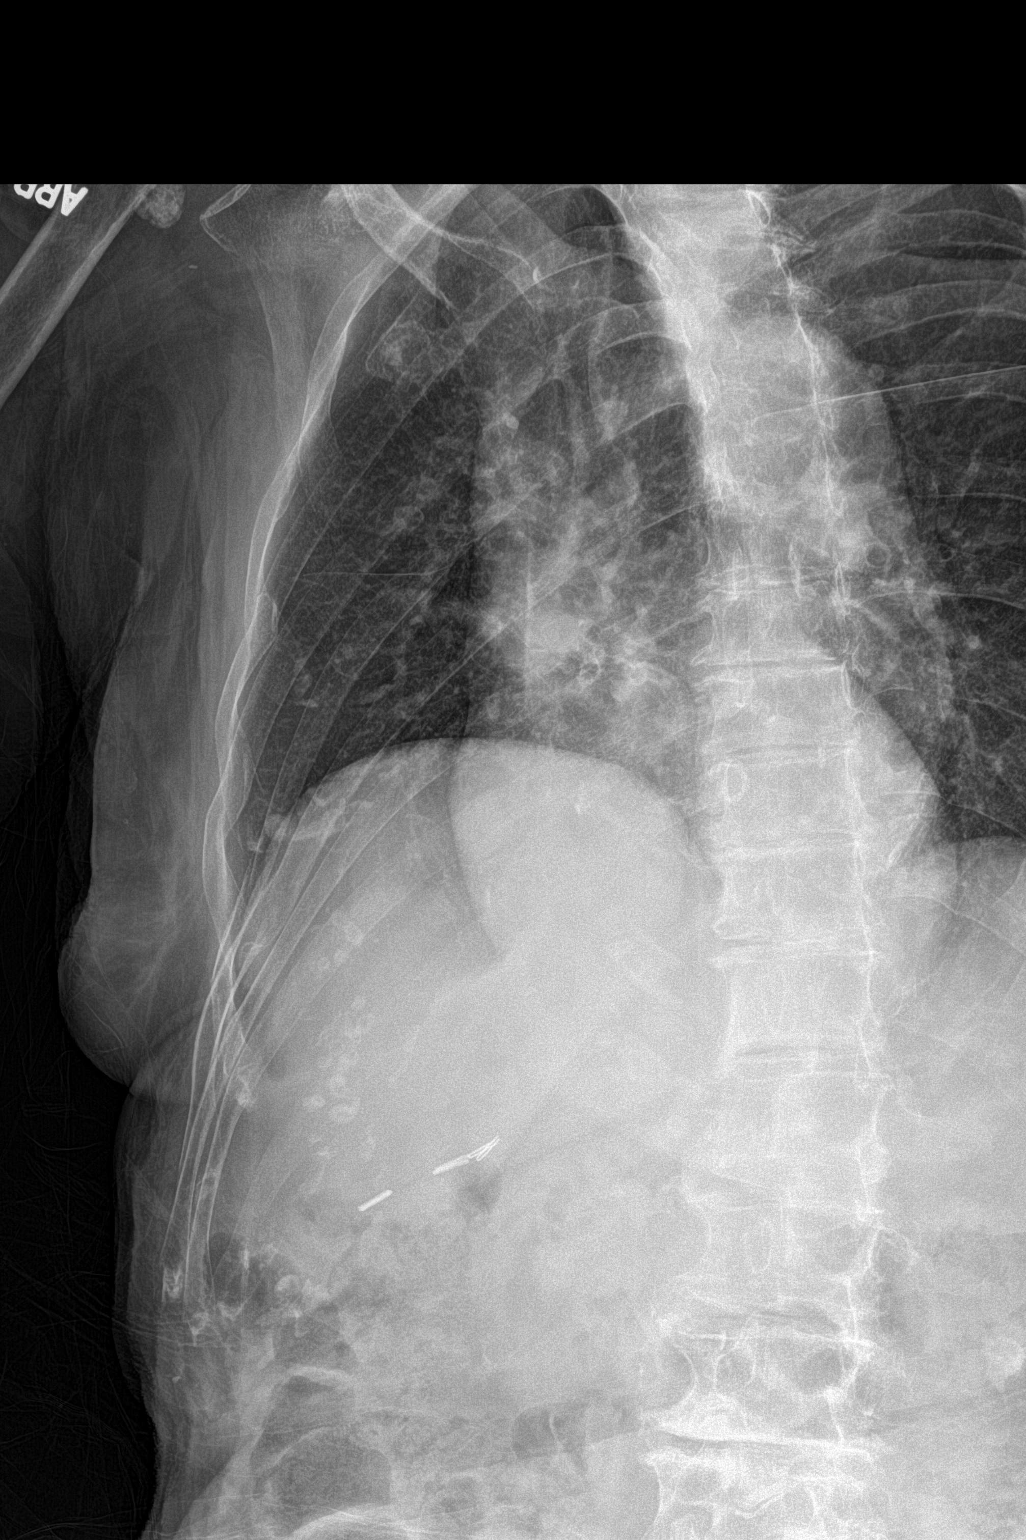

[3 of 3 positions shown; findings below may reference images not displayed]

FINDINGS: Stable mild cardiomegaly. Atherosclerotic and ectatic thoracic
aorta. Central bronchitic changes and diffuse mild interstitial
prominence are similar compared to prior. No pneumothorax or pleural
effusion. There is an acute mildly displaced fracture of the
posterolateral aspect of the right fourth rib. Nondisplaced fracture
of the lateral aspect of the right sixth rib suspected. The bones
are osteopenic. Similar appearance of probable loose body in the
axillary pouch of the right glenohumeral joint. High-riding right
humeral head consistent with chronic rotator cuff injury.
IMPRESSION: 1. Acute mildly displaced fracture of the posterolateral aspect of
the right fourth rib.
2. Suspect nondisplaced fracture of the lateral aspect of the right
sixth rib.
3. No evidence of pneumothorax, hemothorax or other acute
cardiopulmonary process.

## 2016-04-05 ENCOUNTER — Encounter: Payer: Self-pay | Admitting: Internal Medicine

## 2016-04-05 ENCOUNTER — Ambulatory Visit (INDEPENDENT_AMBULATORY_CARE_PROVIDER_SITE_OTHER): Payer: Medicare Other | Admitting: Internal Medicine

## 2016-04-05 VITALS — BP 130/72 | HR 78 | Temp 98.0°F | Wt 103.0 lb

## 2016-04-05 DIAGNOSIS — M1990 Unspecified osteoarthritis, unspecified site: Secondary | ICD-10-CM

## 2016-04-05 DIAGNOSIS — D649 Anemia, unspecified: Secondary | ICD-10-CM

## 2016-04-05 DIAGNOSIS — M810 Age-related osteoporosis without current pathological fracture: Secondary | ICD-10-CM | POA: Diagnosis not present

## 2016-04-05 DIAGNOSIS — I10 Essential (primary) hypertension: Secondary | ICD-10-CM

## 2016-04-05 DIAGNOSIS — M19019 Primary osteoarthritis, unspecified shoulder: Secondary | ICD-10-CM

## 2016-04-05 DIAGNOSIS — M25519 Pain in unspecified shoulder: Secondary | ICD-10-CM

## 2016-04-05 DIAGNOSIS — R634 Abnormal weight loss: Secondary | ICD-10-CM

## 2016-04-05 LAB — HEPATIC FUNCTION PANEL
ALK PHOS: 37 U/L — AB (ref 39–117)
ALT: 13 U/L (ref 0–35)
AST: 18 U/L (ref 0–37)
Albumin: 4.5 g/dL (ref 3.5–5.2)
BILIRUBIN DIRECT: 0.1 mg/dL (ref 0.0–0.3)
BILIRUBIN TOTAL: 0.5 mg/dL (ref 0.2–1.2)
TOTAL PROTEIN: 7.1 g/dL (ref 6.0–8.3)

## 2016-04-05 LAB — TSH: TSH: 2.39 u[IU]/mL (ref 0.35–4.50)

## 2016-04-05 LAB — CBC WITH DIFFERENTIAL/PLATELET
BASOS ABS: 0 10*3/uL (ref 0.0–0.1)
Basophils Relative: 0.2 % (ref 0.0–3.0)
Eosinophils Absolute: 0 10*3/uL (ref 0.0–0.7)
Eosinophils Relative: 0.5 % (ref 0.0–5.0)
HEMATOCRIT: 37.2 % (ref 36.0–46.0)
HEMOGLOBIN: 12 g/dL (ref 12.0–15.0)
LYMPHS PCT: 19.5 % (ref 12.0–46.0)
Lymphs Abs: 1.6 10*3/uL (ref 0.7–4.0)
MCHC: 32.3 g/dL (ref 30.0–36.0)
MCV: 96.3 fl (ref 78.0–100.0)
MONOS PCT: 8.2 % (ref 3.0–12.0)
Monocytes Absolute: 0.7 10*3/uL (ref 0.1–1.0)
NEUTROS ABS: 5.7 10*3/uL (ref 1.4–7.7)
Neutrophils Relative %: 71.6 % (ref 43.0–77.0)
Platelets: 247 10*3/uL (ref 150.0–400.0)
RBC: 3.86 Mil/uL — AB (ref 3.87–5.11)
RDW: 12.6 % (ref 11.5–15.5)
WBC: 8 10*3/uL (ref 4.0–10.5)

## 2016-04-05 LAB — BASIC METABOLIC PANEL
BUN: 24 mg/dL — AB (ref 6–23)
CALCIUM: 10.9 mg/dL — AB (ref 8.4–10.5)
CO2: 32 meq/L (ref 19–32)
Chloride: 102 mEq/L (ref 96–112)
Creatinine, Ser: 1.08 mg/dL (ref 0.40–1.20)
GFR: 50.01 mL/min — AB (ref >60.00)
GLUCOSE: 101 mg/dL — AB (ref 70–99)
Potassium: 4.7 mEq/L (ref 3.5–5.1)
Sodium: 139 mEq/L (ref 135–145)

## 2016-04-05 LAB — VITAMIN D 25 HYDROXY (VIT D DEFICIENCY, FRACTURES): VITD: 30.22 ng/mL (ref 30.00–100.00)

## 2016-04-05 NOTE — Progress Notes (Signed)
Patient ID: Heidi [name], female   DOB: [birth year], 82 y.o.   MRN: [id number]   Subjective:    Patient ID: Heidi [name], female    DOB: May 21, 1918, 82 y.o.   MRN: [id number]  HPI  Patient here for a scheduled follow up.  She is accompanied by her daughter.  History obtained from both of them.  Her daughter lives with her.  States feel - stable.  Eating.  No chest pain.  No sob.  No acid reflux reported.  Still with shoulder pain - bilaterally.  S/p injection.  Still with limited rom.  Hard to brush her hair.  No abdominal pain or cramping.  Bowels stable.     Past Medical History:  Diagnosis Date  . Anemia   . Asthma   . GERD (gastroesophageal reflux disease)   . Hypertension   . Inflammatory arthritis (HCC)    elevated ESR, negative temporal artery bx, low titer rheumatoid factor  . Nephrolithiasis   . OA (osteoarthritis)    Left knee replacement, hands, shoulders, cervial spine  . Osteoporosis    vitamin D deficiency, nasal miacalcin, reclast, previous rib fracture  . Pancreatitis    s/p cholecystectomy with ERCP and stone extraction  . Vitamin D deficiency    Past Surgical History:  Procedure Laterality Date  . ABDOMINAL HYSTERECTOMY     secondary  to bleeding  . LITHOTRIPSY    . REPLACEMENT TOTAL KNEE     Left knee replacement   Family History  Problem Relation Age of Onset  . Hypertension Mother   . Stroke Mother   . Stomach cancer Father   . Arthritis Brother   . Lung cancer Brother    Social History   Social History  . Marital status: Unknown    Spouse name: N/A  . Number of children: 5  . Years of education: N/A   Social History Main Topics  . Smoking status: Never Smoker  . Smokeless tobacco: Never Used  . Alcohol use No  . Drug use: No  . Sexual activity: Not Asked   Other Topics Concern  . None   Social History Narrative   She is widowed. She has five children, three daughter and two son.    Outpatient Encounter Prescriptions as  of 04/05/2016  Medication Sig  . acetaminophen (TYLENOL) 650 MG CR tablet Take 1,300 mg by mouth 2 (two) times daily.   Marland Kitchen acetaminophen-codeine (TYLENOL #3) 300-30 MG tablet Take 1 tablet by mouth every 8 (eight) hours as needed for moderate pain.  Marland Kitchen amLODipine (NORVASC) 5 MG tablet Take 1 tablet (5 mg total) by mouth daily.  . Calcium Carbonate-Vitamin D (CALCIUM 600+D) 600-200 MG-UNIT TABS Take 1 tablet by mouth 2 (two) times daily.  . Cholecalciferol (VITAMIN D) 2000 UNITS tablet Take 2,000 Units by mouth daily.  . diclofenac sodium (VOLTAREN) 1 % GEL Apply 4 g topically 4 (four) times daily.  . meloxicam (MOBIC) 7.5 MG tablet Take 1 tablet (7.5 mg total) by mouth 2 (two) times daily. (Patient taking differently: Take 15 mg by mouth daily. )  . metoprolol succinate (TOPROL-XL) 25 MG 24 hr tablet TAKE ONE HALF TABLET DAILY  . montelukast (SINGULAIR) 10 MG tablet Take 1 tablet (10 mg total) by mouth daily.  Marland Kitchen thiamine (VITAMIN B-1) 100 MG tablet Take 100 mg by mouth daily.  . vitamin C (ASCORBIC ACID) 500 MG tablet Take 500 mg by mouth daily.  . vitamin E 400 UNIT capsule Take 400  Units by mouth daily.   Facility-Administered Encounter Medications as of 04/05/2016  Medication  . pneumococcal 13-valent conjugate vaccine (PREVNAR 13) injection 0.5 mL    Review of Systems  Constitutional: Negative for appetite change and unexpected weight change.  HENT: Negative for congestion and sinus pressure.   Respiratory: Negative for cough, chest tightness and shortness of breath.   Cardiovascular: Negative for chest pain, palpitations and leg swelling.  Gastrointestinal: Negative for abdominal pain, diarrhea, nausea and vomiting.  Genitourinary: Negative for difficulty urinating and dysuria.  Musculoskeletal: Negative for myalgias.       Bilateral shoulder pain as outlined.    Skin: Negative for color change and rash.  Neurological: Negative for dizziness, light-headedness and headaches.    Psychiatric/Behavioral: Negative for agitation and dysphoric mood.       Objective:     Blood pressure rechecked by me:  128-130/72  Physical Exam  Constitutional: She appears well-developed and well-nourished. No distress.  HENT:  Nose: Nose normal.  Mouth/Throat: Oropharynx is clear and moist.  Neck: Neck supple. No thyromegaly present.  Cardiovascular: Normal rate and regular rhythm.   Pulmonary/Chest: Breath sounds normal. No respiratory distress. She has no wheezes.  Abdominal: Soft. Bowel sounds are normal. There is no tenderness.  Musculoskeletal: She exhibits no edema or tenderness.  Lymphadenopathy:    She has no cervical adenopathy.  Skin: No rash noted. No erythema.  Psychiatric: She has a normal mood and affect. Her behavior is normal.    BP 130/72 (BP Location: Left Arm, Cuff Size: Small)   Pulse 78   Temp 98 F (36.7 C) (Oral)   Wt 103 lb (46.7 kg)   SpO2 97%   BMI 18.84 kg/m  Wt Readings from Last 3 Encounters:  04/05/16 103 lb (46.7 kg)  02/01/16 104 lb (47.2 kg)  02/01/16 104 lb 12.8 oz (47.5 kg)     Lab Results  Component Value Date   WBC 8.0 04/05/2016   HGB 12.0 04/05/2016   HCT 37.2 04/05/2016   PLT 247.0 04/05/2016   GLUCOSE 101 (H) 04/05/2016   ALT 13 04/05/2016   AST 18 04/05/2016   NA 139 04/05/2016   K 4.7 04/05/2016   CL 102 04/05/2016   CREATININE 1.08 04/05/2016   BUN 24 (H) 04/05/2016   CO2 32 04/05/2016   TSH 2.39 04/05/2016   INR 0.9 08/30/2013    Dg Ribs Unilateral W/chest Left  Result Date: 02/01/2016 CLINICAL DATA:  Acute onset of left-sided rib pain after fall. Initial encounter. EXAM: LEFT RIBS AND CHEST - 3+ VIEW COMPARISON:  Chest radiograph performed 04/14/2015 FINDINGS: No displaced rib fractures are seen. Mild apparent underlying chronic rib deformities are seen bilaterally. The lungs are well-aerated and clear. There is no evidence of focal opacification, pleural effusion or pneumothorax. The cardiomediastinal  silhouette is within normal limits. No acute osseous abnormalities are seen. Clips are noted within the right upper quadrant, reflecting prior cholecystectomy. IMPRESSION: No acute displaced rib fracture seen. Mild apparent underlying chronic rib deformities noted bilaterally. Electronically Signed   By: Garald Balding M.D.   On: 02/01/2016 20:36   Dg Shoulder Left  Result Date: 02/01/2016 CLINICAL DATA:  Status post fall with left shoulder pain. Initial encounter. EXAM: LEFT SHOULDER - 2+ VIEW COMPARISON:  Left shoulder radiographs performed 12/15/2015 FINDINGS: There is no evidence of fracture or dislocation. The left humeral head is seated within the glenoid fossa. Osteophyte formation is noted about the left humeral head, with chronic superior subluxation of  the left humeral head. Prominent degenerative change is noted at the left acromioclavicular joint, with resorption of the distal left clavicle. No significant soft tissue abnormalities are seen. The visualized portions of the left lung are clear. IMPRESSION: 1. No evidence of fracture or dislocation. 2. 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 Electronically Signed   By: Garald Balding M.D.   On: 02/01/2016 20:38       Assessment & Plan:   Problem List Items Addressed This Visit    Anemia    Follow cbc.        Relevant Orders   CBC with Differential/Platelet (Completed)   Hypertension    Blood pressure rechecked and better.  Same medication regimen.  Blood pressure doing well.  Follow.       Relevant Orders   TSH (Completed)   Hepatic function panel (Completed)   Basic metabolic panel (Completed)   Inflammatory arthritis ([place])    Just evaluated by Dr Jefm Bryant.  S/p injection shoulder.        Loss of weight    Weight relatively stable.        Osteoarthritis    Shoulder pain and limited rom.  S/p injection. Seeing Dr Jefm Bryant.         Osteoporosis - Primary   Relevant Orders   VITAMIN D 25 Hydroxy (Vit-D Deficiency, Fractures) (Completed)   Shoulder pain    Bilateral shoulder pain.  Just saw [place].  S/p injection.  Continue f/u with rheumatology.        Other Visit Diagnoses   None.      Einar Pheasant, MD

## 2016-04-05 NOTE — Progress Notes (Signed)
Pre visit review using our clinic review tool, if applicable. No additional management support is needed unless otherwise documented below in the visit note. 

## 2016-04-06 ENCOUNTER — Other Ambulatory Visit: Payer: Self-pay | Admitting: Internal Medicine

## 2016-04-07 ENCOUNTER — Telehealth: Payer: Self-pay | Admitting: Internal Medicine

## 2016-04-07 NOTE — Telephone Encounter (Signed)
Left a VM to return my call for results, thanks

## 2016-04-07 NOTE — Telephone Encounter (Signed)
Pt left a message on office phone. She is calling for her lab results.

## 2016-04-07 NOTE — Telephone Encounter (Signed)
Notified pt of results, scheduled lab appt

## 2016-04-08 ENCOUNTER — Encounter: Payer: Self-pay | Admitting: Internal Medicine

## 2016-04-08 NOTE — Assessment & Plan Note (Signed)
Blood pressure rechecked and better.  Same medication regimen.  Blood pressure doing well.  Follow.

## 2016-04-08 NOTE — Assessment & Plan Note (Signed)
Follow cbc.  

## 2016-04-08 NOTE — Assessment & Plan Note (Signed)
Bilateral shoulder pain.  Just saw [place].  S/p injection.  Continue f/u with rheumatology.

## 2016-04-08 NOTE — Assessment & Plan Note (Signed)
Shoulder pain and limited rom.  S/p injection. Seeing Dr Jefm Bryant.

## 2016-04-08 NOTE — Assessment & Plan Note (Signed)
Weight relatively stable.

## 2016-04-08 NOTE — Assessment & Plan Note (Signed)
Just evaluated by Dr Jefm Bryant.  S/p injection shoulder.

## 2016-04-14 ENCOUNTER — Other Ambulatory Visit (INDEPENDENT_AMBULATORY_CARE_PROVIDER_SITE_OTHER): Payer: Medicare Other

## 2016-04-14 LAB — BASIC METABOLIC PANEL
BUN: 24 mg/dL — AB (ref 6–23)
CALCIUM: 10.4 mg/dL (ref 8.4–10.5)
CO2: 33 mEq/L — ABNORMAL HIGH (ref 19–32)
Chloride: 101 mEq/L (ref 96–112)
Creatinine, Ser: 1.02 mg/dL (ref 0.40–1.20)
GFR: 53.42 mL/min — AB (ref >60.00)
Glucose, Bld: 120 mg/dL — ABNORMAL HIGH (ref 70–99)
POTASSIUM: 4.3 meq/L (ref 3.5–5.1)
Sodium: 141 mEq/L (ref 135–145)

## 2016-05-06 DIAGNOSIS — L0103 Bullous impetigo: Secondary | ICD-10-CM | POA: Diagnosis not present

## 2016-05-10 ENCOUNTER — Other Ambulatory Visit: Payer: Self-pay

## 2016-05-16 DIAGNOSIS — D0439 Carcinoma in situ of skin of other parts of face: Secondary | ICD-10-CM | POA: Diagnosis not present

## 2016-05-16 DIAGNOSIS — L308 Other specified dermatitis: Secondary | ICD-10-CM | POA: Diagnosis not present

## 2016-05-16 DIAGNOSIS — D485 Neoplasm of uncertain behavior of skin: Secondary | ICD-10-CM | POA: Diagnosis not present

## 2016-05-17 DIAGNOSIS — Z961 Presence of intraocular lens: Secondary | ICD-10-CM | POA: Diagnosis not present

## 2016-05-17 DIAGNOSIS — H524 Presbyopia: Secondary | ICD-10-CM | POA: Diagnosis not present

## 2016-05-25 DIAGNOSIS — D0439 Carcinoma in situ of skin of other parts of face: Secondary | ICD-10-CM | POA: Diagnosis not present

## 2016-06-09 ENCOUNTER — Other Ambulatory Visit: Payer: Self-pay

## 2016-06-09 MED ORDER — AMLODIPINE BESYLATE 5 MG PO TABS: 5.0000 mg | ORAL_TABLET | Freq: Every day | ORAL | 3 refills | Status: DC

## 2016-06-09 NOTE — Progress Notes (Signed)
X request from [place] approved

## 2016-06-20 ENCOUNTER — Other Ambulatory Visit: Payer: Self-pay

## 2016-06-20 MED ORDER — METOPROLOL SUCCINATE ER 25 MG PO TB24: ORAL_TABLET | ORAL | 11 refills | Status: DC

## 2016-06-23 DIAGNOSIS — I10 Essential (primary) hypertension: Secondary | ICD-10-CM | POA: Diagnosis not present

## 2016-06-23 DIAGNOSIS — S51811A Laceration without foreign body of right forearm, initial encounter: Secondary | ICD-10-CM | POA: Diagnosis not present

## 2016-06-23 DIAGNOSIS — W010XXA Fall on same level from slipping, tripping and stumbling without subsequent striking against object, initial encounter: Secondary | ICD-10-CM | POA: Diagnosis not present

## 2016-06-26 DIAGNOSIS — M150 Primary generalized (osteo)arthritis: Secondary | ICD-10-CM | POA: Diagnosis not present

## 2016-06-26 DIAGNOSIS — M25511 Pain in right shoulder: Secondary | ICD-10-CM | POA: Diagnosis not present

## 2016-06-26 DIAGNOSIS — G8929 Other chronic pain: Secondary | ICD-10-CM | POA: Diagnosis not present

## 2016-06-26 DIAGNOSIS — M25512 Pain in left shoulder: Secondary | ICD-10-CM | POA: Diagnosis not present

## 2016-07-05 ENCOUNTER — Ambulatory Visit (INDEPENDENT_AMBULATORY_CARE_PROVIDER_SITE_OTHER): Payer: Medicare Other | Admitting: Family

## 2016-07-05 ENCOUNTER — Encounter: Payer: Self-pay | Admitting: Family

## 2016-07-05 ENCOUNTER — Encounter (INDEPENDENT_AMBULATORY_CARE_PROVIDER_SITE_OTHER): Payer: Self-pay

## 2016-07-05 VITALS — BP 110/62 | HR 72 | Temp 97.3°F | Ht 62.0 in | Wt 104.4 lb

## 2016-07-05 DIAGNOSIS — Z4802 Encounter for removal of sutures: Secondary | ICD-10-CM | POA: Diagnosis not present

## 2016-07-05 DIAGNOSIS — Z23 Encounter for immunization: Secondary | ICD-10-CM | POA: Diagnosis not present

## 2016-07-05 DIAGNOSIS — R350 Frequency of micturition: Secondary | ICD-10-CM | POA: Diagnosis not present

## 2016-07-05 LAB — URINALYSIS, ROUTINE W REFLEX MICROSCOPIC
BILIRUBIN URINE: NEGATIVE
Hgb urine dipstick: NEGATIVE
KETONES UR: NEGATIVE
LEUKOCYTES UA: NEGATIVE
Nitrite: NEGATIVE
PH: 5.5 (ref 5.0–8.0)
RBC / HPF: NONE SEEN (ref 0–?)
SPECIFIC GRAVITY, URINE: 1.02 (ref 1.000–1.030)
UROBILINOGEN UA: 0.2 (ref 0.0–1.0)
Urine Glucose: NEGATIVE

## 2016-07-05 LAB — POCT URINALYSIS DIPSTICK
BILIRUBIN UA: NEGATIVE
Blood, UA: NEGATIVE
GLUCOSE UA: NEGATIVE
Ketones, UA: NEGATIVE
LEUKOCYTES UA: NEGATIVE
NITRITE UA: NEGATIVE
Protein, UA: 30
Spec Grav, UA: 1.02
UROBILINOGEN UA: 0.2
pH, UA: 5.5

## 2016-07-05 MED ORDER — MUPIROCIN 2 % EX OINT: 1.0000 "application " | TOPICAL_OINTMENT | Freq: Two times a day (BID) | CUTANEOUS | 0 refills | Status: DC

## 2016-07-05 NOTE — Progress Notes (Signed)
Pre visit review using our clinic review tool, if applicable. No additional management support is needed unless otherwise documented below in the visit note. 

## 2016-07-05 NOTE — Progress Notes (Signed)
Subjective:    Patient ID: Heidi [name], female    DOB: 21-May-1918, 82 y.o.   MRN: [id number]  CC: Heidi [name] is a 82 y.o. female who presents today for follow up.   HPI: Patient here for acute visit for suture removal right arm. She fell at home 11 days ago and went to the emergency room in [place]. No LOC or hit head. Tripped and hit rocking chair with right arm. 14 sutures placed.  Unable to see these records in care everywhere. No discharge, bleeding, increased warmth from laceration.  Patient has no fever  Patient also complains of urinary frequency during the day, especially at night of the past several weeks. No flank pain, fever. She hasnt seen  blood in her urine.    HISTORY:  Past Medical History:  Diagnosis Date  . Anemia   . Asthma   . GERD (gastroesophageal reflux disease)   . Hypertension   . Inflammatory arthritis    elevated ESR, negative temporal artery bx, low titer rheumatoid factor  . Nephrolithiasis   . OA (osteoarthritis)    Left knee replacement, hands, shoulders, cervial spine  . Osteoporosis    vitamin D deficiency, nasal miacalcin, reclast, previous rib fracture  . Pancreatitis    s/p cholecystectomy with ERCP and stone extraction  . Vitamin D deficiency    Past Surgical History:  Procedure Laterality Date  . ABDOMINAL HYSTERECTOMY     secondary  to bleeding  . LITHOTRIPSY    . REPLACEMENT TOTAL KNEE     Left knee replacement   Family History  Problem Relation Age of Onset  . Hypertension Mother   . Stroke Mother   . Stomach cancer Father   . Arthritis Brother   . Lung cancer Brother     Allergies: Aspirin; Levaquin [levofloxacin in d5w]; Penicillins; Sulfa antibiotics; Tramadol; Macrobid [nitrofurantoin monohyd macro]; and Omnicef [cefdinir] Current Outpatient Prescriptions on File Prior to Visit  Medication Sig Dispense Refill  . acetaminophen (TYLENOL) 650 MG CR tablet Take 1,300 mg by mouth 2 (two) times daily.       Marland Kitchen acetaminophen-codeine (TYLENOL #3) 300-30 MG tablet Take 1 tablet by mouth every 8 (eight) hours as needed for moderate pain. 15 tablet 0  . amLODipine (NORVASC) 5 MG tablet Take 1 tablet (5 mg total) by mouth daily. 90 tablet 3  . Calcium Carbonate-Vitamin D (CALCIUM 600+D) 600-200 MG-UNIT TABS Take 1 tablet by mouth 2 (two) times daily.    . Cholecalciferol (VITAMIN D) 2000 UNITS tablet Take 2,000 Units by mouth daily.    . diclofenac sodium (VOLTAREN) 1 % GEL Apply 4 g topically 4 (four) times daily. 1 Tube 3  . meloxicam (MOBIC) 7.5 MG tablet Take 1 tablet (7.5 mg total) by mouth 2 (two) times daily. (Patient taking differently: Take 15 mg by mouth daily. ) 30 tablet 2  . metoprolol succinate (TOPROL-XL) 25 MG 24 hr tablet TAKE ONE HALF TABLET DAILY 15 tablet 11  . montelukast (SINGULAIR) 10 MG tablet Take 1 tablet (10 mg total) by mouth daily. 30 tablet 11  . thiamine (VITAMIN B-1) 100 MG tablet Take 100 mg by mouth daily.    . vitamin C (ASCORBIC ACID) 500 MG tablet Take 500 mg by mouth daily.    . vitamin E 400 UNIT capsule Take 400 Units by mouth daily.     Current Facility-Administered Medications on File Prior to Visit  Medication Dose Route Frequency Provider Last Rate Last Dose  .  pneumococcal 13-valent conjugate vaccine (PREVNAR 13) injection 0.5 mL  0.5 mL Intramuscular Tomorrow-1000 Einar Pheasant, MD        Social History  Substance Use Topics  . Smoking status: Never Smoker  . Smokeless tobacco: Never Used  . Alcohol use No    Review of Systems  Constitutional: Negative for chills and fever.  Respiratory: Negative for cough.   Cardiovascular: Negative for chest pain and palpitations.  Gastrointestinal: Negative for nausea and vomiting.  Skin: Positive for wound.      Objective:    BP 110/62   Pulse 72   Temp 97.3 F (36.3 C) (Oral)   Ht _0  (1.575 m)   Wt 104 lb 6.4 oz (47.4 kg)   BMI 19.10 kg/m  BP Readings from Last 3 Encounters:  07/05/16 110/62   04/08/16 130/72  02/01/16 (!) 165/70   Wt Readings from Last 3 Encounters:  07/05/16 104 lb 6.4 oz (47.4 kg)  04/05/16 103 lb (46.7 kg)  02/01/16 104 lb (47.2 kg)    Physical Exam  Constitutional: She appears well-developed and well-nourished.  Eyes: Conjunctivae are normal.  Cardiovascular: Normal rate, regular rhythm, normal heart sounds and normal pulses.   Pulmonary/Chest: Effort normal and breath sounds normal. She has no wheezes. She has no rhonchi. She has no rales.  Abdominal: There is no CVA tenderness.  Neurological: She is alert.  Skin: Skin is warm and dry.  Psychiatric: She has a normal mood and affect. Her speech is normal and behavior is normal. Thought content normal.  Vitals reviewed.  14 sutures removed from lateral aspect of right arm. Sutures removed in their entirety. Inspected skin did not appreciate any remaining sutures. Wound is well approximated, no draining or discharge. Localized erythema, swelling mild proximal to laceration. No streaking, increased warmth. Steri strips and dressing applied to laceration.      Assessment & Plan:   Problem List Items Addressed This Visit      Other   Urinary frequency    Afebrile. No CVA tenderness. UA negative for nitrites, leukocytes, blood to indicate infection. Advised patient to follow up if symptom persist. Consider OAB.      Relevant Orders   POCT urinalysis dipstick (Completed)   Urinalysis, Routine w reflex microscopic (not at [facility]) (Completed)   Visit for suture removal - Primary    Sutures removed and patient tolerated well. No signs of infection. Advised daughter and patient to maintain close vigilance for signs of infection. Cover with bactroban. Return precautions given.      Relevant Medications   mupirocin ointment (BACTROBAN) 2 %    Other Visit Diagnoses    Encounter for immunization       Relevant Medications   mupirocin ointment (BACTROBAN) 2 %   Other Relevant Orders   Flu vaccine HIGH  DOSE PF (Completed)       I am having Ms. Maricela Bo start on mupirocin ointment. I am also having her maintain her Calcium Carbonate-Vitamin D, vitamin C, vitamin E, thiamine, acetaminophen, Vitamin D, montelukast, meloxicam, acetaminophen-codeine, diclofenac sodium, amLODipine, and metoprolol succinate. We will continue to administer pneumococcal 13-valent conjugate vaccine.   Meds ordered this encounter  Medications  . mupirocin ointment (BACTROBAN) 2 %    Sig: Apply 1 application topically 2 (two) times daily.    Dispense:  22 g    Refill:  0    Order Specific Question:   Supervising Provider    Answer:   Deborra Medina L [[id number]]  Return precautions given.   Risks, benefits, and alternatives of the medications and treatment plan prescribed today were discussed, and patient expressed understanding.   Education regarding symptom management and diagnosis given to patient on AVS.  Continue to follow with Einar Pheasant, MD for routine health maintenance.   Heidi [name] and I agreed with plan.   Mable Paris, FNP

## 2016-07-05 NOTE — Patient Instructions (Signed)
Please stay vigilant with signs of infection. Ointment with antimicrobial activity.  Keep laceration dry, clean, covered. Let Steri-Strips fall off on their own.   If there is no improvement in your symptoms, or if there is any worsening of symptoms, or if you have any additional concerns, please return for re-evaluation; or, if we are closed, consider going to the Emergency Room for evaluation if symptoms urgent.

## 2016-07-07 DIAGNOSIS — Z4802 Encounter for removal of sutures: Secondary | ICD-10-CM | POA: Insufficient documentation

## 2016-07-07 NOTE — Assessment & Plan Note (Signed)
Sutures removed and patient tolerated well. No signs of infection. Advised daughter and patient to maintain close vigilance for signs of infection. Cover with bactroban. Return precautions given.

## 2016-07-07 NOTE — Assessment & Plan Note (Addendum)
Afebrile. No CVA tenderness. UA negative for nitrites, leukocytes, blood to indicate infection. Advised patient to follow up if symptom persist. Consider OAB.

## 2016-07-10 ENCOUNTER — Other Ambulatory Visit: Payer: Self-pay | Admitting: Family

## 2016-07-10 DIAGNOSIS — M19019 Primary osteoarthritis, unspecified shoulder: Secondary | ICD-10-CM

## 2016-07-12 ENCOUNTER — Telehealth: Payer: Self-pay | Admitting: Internal Medicine

## 2016-07-12 NOTE — Telephone Encounter (Signed)
I called pt and left vm to call office to sch AWV.Thank you!

## 2016-07-18 ENCOUNTER — Other Ambulatory Visit: Payer: Self-pay | Admitting: Internal Medicine

## 2016-07-18 DIAGNOSIS — M19019 Primary osteoarthritis, unspecified shoulder: Secondary | ICD-10-CM

## 2016-07-18 MED ORDER — DICLOFENAC SODIUM 1 % TD GEL: 4.0000 g | Freq: Four times a day (QID) | TRANSDERMAL | 3 refills | Status: DC

## 2016-07-18 NOTE — Telephone Encounter (Signed)
diclofenac sodium (VOLTAREN) 1 % GEL  °

## 2016-08-09 ENCOUNTER — Encounter: Payer: Self-pay | Admitting: Internal Medicine

## 2016-08-09 ENCOUNTER — Ambulatory Visit (INDEPENDENT_AMBULATORY_CARE_PROVIDER_SITE_OTHER): Payer: Medicare Other | Admitting: Internal Medicine

## 2016-08-09 VITALS — BP 146/70 | HR 73 | Temp 97.4°F | Ht 62.0 in | Wt 103.4 lb

## 2016-08-09 DIAGNOSIS — I10 Essential (primary) hypertension: Secondary | ICD-10-CM | POA: Diagnosis not present

## 2016-08-09 DIAGNOSIS — R3915 Urgency of urination: Secondary | ICD-10-CM

## 2016-08-09 DIAGNOSIS — D649 Anemia, unspecified: Secondary | ICD-10-CM

## 2016-08-09 DIAGNOSIS — K219 Gastro-esophageal reflux disease without esophagitis: Secondary | ICD-10-CM | POA: Diagnosis not present

## 2016-08-09 DIAGNOSIS — M25519 Pain in unspecified shoulder: Secondary | ICD-10-CM | POA: Diagnosis not present

## 2016-08-09 LAB — URINALYSIS, ROUTINE W REFLEX MICROSCOPIC
Bilirubin Urine: NEGATIVE
Hgb urine dipstick: NEGATIVE
KETONES UR: NEGATIVE
Leukocytes, UA: NEGATIVE
Nitrite: NEGATIVE
PH: 5.5 (ref 5.0–8.0)
SPECIFIC GRAVITY, URINE: 1.025 (ref 1.000–1.030)
URINE GLUCOSE: NEGATIVE
UROBILINOGEN UA: 0.2 (ref 0.0–1.0)

## 2016-08-09 LAB — BASIC METABOLIC PANEL
BUN: 33 mg/dL — AB (ref 6–23)
CALCIUM: 10.5 mg/dL (ref 8.4–10.5)
CO2: 30 mEq/L (ref 19–32)
Chloride: 104 mEq/L (ref 96–112)
Creatinine, Ser: 1.03 mg/dL (ref 0.40–1.20)
GFR: 52.78 mL/min — AB (ref >60.00)
GLUCOSE: 95 mg/dL (ref 70–99)
Potassium: 4.4 mEq/L (ref 3.5–5.1)
SODIUM: 142 meq/L (ref 135–145)

## 2016-08-09 NOTE — Progress Notes (Signed)
Pre visit review using our clinic review tool, if applicable. No additional management support is needed unless otherwise documented below in the visit note. 

## 2016-08-09 NOTE — Progress Notes (Signed)
Patient ID: Heidi [name], female   DOB: [birth year], 82 y.o.   MRN: [id number]   Subjective:    Patient ID: Heidi [name], female    DOB: September 29, 1917, 82 y.o.   MRN: [id number]  HPI  Patient here for a scheduled follow up.  Has had chronic shoulder pain.  Sees Dr Jefm Bryant.  Recently had a fall.  Saw Dr Jefm Bryant 06/26/16.  S/p injection.  Recommended f/u in 6 months.  She required sutures.  She reports persistent increased pain. Was questioning if any other options to control her pain.  Daughter sees Dr Sharlet Salina.  She accompanies her today.  History obtained from both of them.  Discussed referral to Dr Sharlet Salina for pain control.  Eating.  No chest pain.  Breathing stable.  No abdominal pain or cramping.  Bowels stable.     Past Medical History:  Diagnosis Date  . Anemia   . Asthma   . GERD (gastroesophageal reflux disease)   . Hypertension   . Inflammatory arthritis    elevated ESR, negative temporal artery bx, low titer rheumatoid factor  . Nephrolithiasis   . OA (osteoarthritis)    Left knee replacement, hands, shoulders, cervial spine  . Osteoporosis    vitamin D deficiency, nasal miacalcin, reclast, previous rib fracture  . Pancreatitis    s/p cholecystectomy with ERCP and stone extraction  . Vitamin D deficiency    Past Surgical History:  Procedure Laterality Date  . ABDOMINAL HYSTERECTOMY     secondary  to bleeding  . LITHOTRIPSY    . REPLACEMENT TOTAL KNEE     Left knee replacement   Family History  Problem Relation Age of Onset  . Hypertension Mother   . Stroke Mother   . Stomach cancer Father   . Arthritis Brother   . Lung cancer Brother    Social History   Social History  . Marital status: Unknown    Spouse name: N/A  . Number of children: 5  . Years of education: N/A   Social History Main Topics  . Smoking status: Never Smoker  . Smokeless tobacco: Never Used  . Alcohol use No  . Drug use: No  . Sexual activity: Not Asked   Other Topics  Concern  . None   Social History Narrative   She is widowed. She has five children, three daughter and two son.    Outpatient Encounter Prescriptions as of 08/09/2016  Medication Sig  . acetaminophen (TYLENOL) 650 MG CR tablet Take 1,300 mg by mouth 2 (two) times daily.   Marland Kitchen acetaminophen-codeine (TYLENOL #3) 300-30 MG tablet Take 1 tablet by mouth every 8 (eight) hours as needed for moderate pain.  Marland Kitchen amLODipine (NORVASC) 5 MG tablet Take 1 tablet (5 mg total) by mouth daily.  . Calcium Carbonate-Vitamin D (CALCIUM 600+D) 600-200 MG-UNIT TABS Take 1 tablet by mouth 2 (two) times daily.  . Cholecalciferol (VITAMIN D) 2000 UNITS tablet Take 2,000 Units by mouth daily.  . diclofenac sodium (VOLTAREN) 1 % GEL Apply 4 g topically 4 (four) times daily.  . meloxicam (MOBIC) 7.5 MG tablet Take 1 tablet (7.5 mg total) by mouth 2 (two) times daily. (Patient taking differently: Take 15 mg by mouth daily. )  . metoprolol succinate (TOPROL-XL) 25 MG 24 hr tablet TAKE ONE HALF TABLET DAILY  . montelukast (SINGULAIR) 10 MG tablet TAKE 1 TABLET BY MOUTH ONCE DAILY.  . mupirocin ointment (BACTROBAN) 2 % Apply 1 application topically 2 (two) times daily.  Marland Kitchen  thiamine (VITAMIN B-1) 100 MG tablet Take 100 mg by mouth daily.  . vitamin C (ASCORBIC ACID) 500 MG tablet Take 500 mg by mouth daily.  . vitamin E 400 UNIT capsule Take 400 Units by mouth daily.   Facility-Administered Encounter Medications as of 08/09/2016  Medication  . pneumococcal 13-valent conjugate vaccine (PREVNAR 13) injection 0.5 mL    Review of Systems  Constitutional: Negative for appetite change and unexpected weight change.  HENT: Negative for congestion and sinus pressure.   Respiratory: Negative for cough, chest tightness and shortness of breath.   Cardiovascular: Negative for chest pain, palpitations and leg swelling.  Gastrointestinal: Negative for abdominal pain, diarrhea, nausea and vomiting.  Genitourinary: Negative for  difficulty urinating and dysuria.  Musculoskeletal: Negative for gait problem.       Shoulder pain as outlined.    Skin: Negative for color change and rash.  Neurological: Negative for dizziness, light-headedness and headaches.  Psychiatric/Behavioral: Negative for agitation and dysphoric mood.       Objective:    Physical Exam  Constitutional: She appears well-developed and well-nourished. No distress.  HENT:  Nose: Nose normal.  Mouth/Throat: Oropharynx is clear and moist.  Neck: Neck supple. No thyromegaly present.  Cardiovascular: Normal rate and regular rhythm.   Pulmonary/Chest: Breath sounds normal. No respiratory distress. She has no wheezes.  Abdominal: Soft. Bowel sounds are normal. There is no tenderness.  Musculoskeletal: She exhibits no edema or tenderness.  Limited rom - shoulders.  Increased pain with movement and rotation of shoulders.    Lymphadenopathy:    She has no cervical adenopathy.  Skin: No rash noted. No erythema.  Psychiatric: She has a normal mood and affect. Her behavior is normal.    BP (!) 146/70   Pulse 73   Temp 97.4 F (36.3 C) (Oral)   Ht '5\' 2"'  (1.575 m)   Wt 103 lb 6.4 oz (46.9 kg)   SpO2 95%   BMI 18.91 kg/m  Wt Readings from Last 3 Encounters:  08/09/16 103 lb 6.4 oz (46.9 kg)  07/05/16 104 lb 6.4 oz (47.4 kg)  04/05/16 103 lb (46.7 kg)     Lab Results  Component Value Date   WBC 8.0 04/05/2016   HGB 12.0 04/05/2016   HCT 37.2 04/05/2016   PLT 247.0 04/05/2016   GLUCOSE 95 08/09/2016   ALT 13 04/05/2016   AST 18 04/05/2016   NA 142 08/09/2016   K 4.4 08/09/2016   CL 104 08/09/2016   CREATININE 1.03 08/09/2016   BUN 33 (H) 08/09/2016   CO2 30 08/09/2016   TSH 2.39 04/05/2016   INR 0.9 08/30/2013    Dg Ribs Unilateral W/chest Left  Result Date: 02/01/2016 CLINICAL DATA:  Acute onset of left-sided rib pain after fall. Initial encounter. EXAM: LEFT RIBS AND CHEST - 3+ VIEW COMPARISON:  Chest radiograph performed  04/14/2015 FINDINGS: No displaced rib fractures are seen. Mild apparent underlying chronic rib deformities are seen bilaterally. The lungs are well-aerated and clear. There is no evidence of focal opacification, pleural effusion or pneumothorax. The cardiomediastinal silhouette is within normal limits. No acute osseous abnormalities are seen. Clips are noted within the right upper quadrant, reflecting prior cholecystectomy. IMPRESSION: No acute displaced rib fracture seen. Mild apparent underlying chronic rib deformities noted bilaterally. Electronically Signed   By: Garald Balding M.D.   On: 02/01/2016 20:36   Dg Shoulder Left  Result Date: 02/01/2016 CLINICAL DATA:  Status post fall with left shoulder pain. Initial encounter.  EXAM: LEFT SHOULDER - 2+ VIEW COMPARISON:  Left shoulder radiographs performed 12/15/2015 FINDINGS: There is no evidence of fracture or dislocation. The left humeral head is seated within the glenoid fossa. Osteophyte formation is noted about the left humeral head, with chronic superior subluxation of the left humeral head. Prominent degenerative change is noted at the left acromioclavicular joint, with resorption of the distal left clavicle. No significant soft tissue abnormalities are seen. The visualized portions of the left lung are clear. IMPRESSION: 1. No evidence of fracture or dislocation. 2. 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 Electronically Signed   By: Garald Balding M.D.   On: 02/01/2016 20:38       Assessment & Plan:   Problem List Items Addressed This Visit    Anemia    Follow cbc.       GERD (gastroesophageal reflux disease)    Symptoms controlled on omeprazole.        Hypertension - Primary    Blood pressure under reasonable control.  Follow pressures.  Follow metabolic panel.        Relevant Orders   Urinalysis, Routine w reflex microscopic (not at [facility])  (Completed)   Basic metabolic panel (Completed)   Shoulder pain    Bilateral shoulder pain.  Has seen Dr Jefm Bryant.  Recently had injection.  Did not help.  Still with increased pain.  Daughter sees Dr Sharlet Salina.  Will refer to Dr Sharlet Salina for evaluation.  D/w Dr Jefm Bryant.         Other Visit Diagnoses    Urinary urgency       check urinalysis to confirm no infection.         Einar Pheasant, MD

## 2016-08-10 ENCOUNTER — Other Ambulatory Visit: Payer: Self-pay | Admitting: Internal Medicine

## 2016-08-10 DIAGNOSIS — R3915 Urgency of urination: Secondary | ICD-10-CM | POA: Diagnosis not present

## 2016-08-10 NOTE — Progress Notes (Signed)
Order placed for urine culture 

## 2016-08-13 ENCOUNTER — Encounter: Payer: Self-pay | Admitting: Internal Medicine

## 2016-08-13 LAB — CULTURE, URINE COMPREHENSIVE

## 2016-08-13 NOTE — Assessment & Plan Note (Signed)
Blood pressure under reasonable control.  Follow pressures.  Follow metabolic panel.  

## 2016-08-13 NOTE — Assessment & Plan Note (Signed)
Symptoms controlled on omeprazole.   

## 2016-08-13 NOTE — Assessment & Plan Note (Signed)
Bilateral shoulder pain.  Has seen Dr Jefm Bryant.  Recently had injection.  Did not help.  Still with increased pain.  Daughter sees Dr Sharlet Salina.  Will refer to Dr Sharlet Salina for evaluation.  D/w Dr Jefm Bryant.

## 2016-08-13 NOTE — Assessment & Plan Note (Signed)
Follow cbc.  

## 2016-08-15 ENCOUNTER — Other Ambulatory Visit: Payer: Self-pay | Admitting: Internal Medicine

## 2016-08-15 MED ORDER — FLUCONAZOLE 150 MG PO TABS: ORAL_TABLET | ORAL | 0 refills | Status: DC

## 2016-08-15 NOTE — Progress Notes (Signed)
rx sent in for diflucan  

## 2016-08-31 ENCOUNTER — Ambulatory Visit (INDEPENDENT_AMBULATORY_CARE_PROVIDER_SITE_OTHER): Payer: Medicare Other

## 2016-08-31 VITALS — BP 132/70 | HR 77 | Resp 12 | Ht 60.0 in | Wt 104.1 lb

## 2016-08-31 DIAGNOSIS — Z0000 Encounter for general adult medical examination without abnormal findings: Secondary | ICD-10-CM

## 2016-08-31 NOTE — Progress Notes (Signed)
Subjective:   Heidi [name] is a 82 y.o. female who presents for an Initial Medicare Annual Wellness Visit.  Review of Systems    No ROS.  Medicare Wellness Visit.  Cardiac Risk Factors include: advanced age (>17mn, >>72women);hypertension     Objective:    Today's Vitals   08/31/16 1538  BP: 132/70  Pulse: 77  Resp: 12  SpO2: 95%  Weight: 104 lb 1.9 oz (47.2 kg)  Height: 5' (1.524 m)   Body mass index is 20.33 kg/m.   Current Medications (verified) Outpatient Encounter Prescriptions as of 08/31/2016  Medication Sig  . acetaminophen (TYLENOL) 650 MG CR tablet Take 1,300 mg by mouth 2 (two) times daily.   .Marland Kitchenacetaminophen-codeine (TYLENOL #3) 300-30 MG tablet Take 1 tablet by mouth every 8 (eight) hours as needed for moderate pain.  .Marland KitchenamLODipine (NORVASC) 5 MG tablet Take 1 tablet (5 mg total) by mouth daily.  . Calcium Carbonate-Vitamin D (CALCIUM 600+D) 600-200 MG-UNIT TABS Take 1 tablet by mouth 2 (two) times daily.  . Cholecalciferol (VITAMIN D) 2000 UNITS tablet Take 2,000 Units by mouth daily.  . diclofenac sodium (VOLTAREN) 1 % GEL Apply 4 g topically 4 (four) times daily.  . meloxicam (MOBIC) 7.5 MG tablet Take 1 tablet (7.5 mg total) by mouth 2 (two) times daily. (Patient taking differently: Take 15 mg by mouth daily. )  . metoprolol succinate (TOPROL-XL) 25 MG 24 hr tablet TAKE ONE HALF TABLET DAILY  . montelukast (SINGULAIR) 10 MG tablet TAKE 1 TABLET BY MOUTH ONCE DAILY.  .Marland Kitchenthiamine (VITAMIN B-1) 100 MG tablet Take 100 mg by mouth daily.  . vitamin C (ASCORBIC ACID) 500 MG tablet Take 500 mg by mouth daily.  . vitamin E 400 UNIT capsule Take 400 Units by mouth daily.  . [DISCONTINUED] fluconazole (DIFLUCAN) 150 MG tablet Take one tablet by mouth today and repeat x 1 in 3 days.  . [DISCONTINUED] mupirocin ointment (BACTROBAN) 2 % Apply 1 application topically 2 (two) times daily.   Facility-Administered Encounter Medications as of 08/31/2016  Medication   . pneumococcal 13-valent conjugate vaccine (PREVNAR 13) injection 0.5 mL    Allergies (verified) Anhydrous base; Aspirin; Carbapenems; Cephalosporins; Levaquin [levofloxacin in d5w]; Penicillins; Sulfa antibiotics; Sulfonylureas; Thiazide-type diuretics; Tramadol; Macrobid [nitrofurantoin monohyd macro]; and Omnicef [cefdinir]   History: Past Medical History:  Diagnosis Date  . Anemia   . Asthma   . GERD (gastroesophageal reflux disease)   . Hypertension   . Inflammatory arthritis    elevated ESR, negative temporal artery bx, low titer rheumatoid factor  . Nephrolithiasis   . OA (osteoarthritis)    Left knee replacement, hands, shoulders, cervial spine  . Osteoporosis    vitamin D deficiency, nasal miacalcin, reclast, previous rib fracture  . Pancreatitis    s/p cholecystectomy with ERCP and stone extraction  . Vitamin D deficiency    Past Surgical History:  Procedure Laterality Date  . ABDOMINAL HYSTERECTOMY     secondary  to bleeding  . LITHOTRIPSY    . REPLACEMENT TOTAL KNEE     Left knee replacement   Family History  Problem Relation Age of Onset  . Hypertension Mother   . Stroke Mother   . Stomach cancer Father   . Arthritis Brother   . Lung cancer Brother    Social History   Occupational History  . Not on file.   Social History Main Topics  . Smoking status: Never Smoker  . Smokeless tobacco: Never Used  .  Alcohol use No  . Drug use: No  . Sexual activity: Not on file    Tobacco Counseling Counseling given: Not Answered   Activities of Daily Living In your present state of health, do you have any difficulty performing the following activities: 08/31/2016  Hearing? Y  Heidi? N  Difficulty concentrating or making decisions? Y  Walking or climbing stairs? Y  Dressing or bathing? Y  Doing errands, shopping? Y  Preparing Food and eating ? Y  Using the Toilet? N  In the past six months, have you accidently leaked urine? N  Do you have problems  with loss of bowel control? Y  Managing your Medications? Y  Managing your Finances? Y  Housekeeping or managing your Housekeeping? Y  Some recent data might be hidden    Immunizations and Health Maintenance Immunization History  Administered Date(s) Administered  . Influenza Split 05/26/2014  . Influenza, High Dose Seasonal PF 07/05/2016  . Influenza,inj,Quad PF,36+ Mos 06/10/2015  . Influenza-Unspecified 04/11/2014  . Pneumococcal Conjugate-13 11/26/2015   There are no preventive care reminders to display for this patient.  Patient Care Team: Einar Pheasant, MD as PCP - General (Internal Medicine)  Indicate any recent Medical Services you may have received from other than [facility] providers in the past year (date may be approximate).     Assessment:   This is a routine wellness examination for Heidi [name]. The goal of the wellness visit is to assist the patient how to close the gaps in care and create a preventative care plan for the patient. Daughter Heidi [name]) is present, HIPPA complaint.  Taking calcium VIT D as appropriate/Osteoporosis reviewed.  Medications reviewed; taking without issues or barriers.  Safety issues reviewed; lives with daughter.  Smoke detectors in the home. No firearms in the home. Wears seatbelts when driving or riding with others. No violence in the home.  No identified risk were noted; The patient was oriented to person and time; appropriate in dress and manner.  Assistance with ADLs required due to shoulder pain.  Daughter assists.  BMI; discussed the importance of a healthy diet, water intake and exercise. Educational material provided.  Health maintenance gaps; closed.  Patient Concerns: None at this time. Follow up with PCP as needed.  Hearing/Heidi screen Hearing Screening Comments: Followed by Dr. Sharma [name] R ear hearing aid  ENT Heidi Screening Comments: Followed by Heidi [name] Heidi [name]) Wears glasses Last OV  03/2016 Bilateral cataracts extracted  Dietary issues and exercise activities discussed: Current Exercise Habits: The patient does not participate in regular exercise at present  Goals    . Increase water intake          Stay hydrated and drink plenty of water/fluids      Depression Screen PHQ 2/9 Scores 08/31/2016 08/09/2016 07/05/2016 04/23/2014 02/19/2013 01/12/2013 11/10/2012  PHQ - 2 Score 0 0 0 0 0 0 0    Fall Risk Fall Risk  08/31/2016 08/09/2016 07/05/2016 05/10/2016 08/12/2014  Falls in the past year? Yes No Yes No Yes  Number falls in past yr: 2 or more - 2 or more - 2 or more  Injury with Fall? Yes - Yes - -  Risk Factor Category  High Fall Risk - High Fall Risk - -  Risk for fall due to : History of fall(s) - History of fall(s) - Impaired balance/gait  Follow up Falls prevention discussed;Education provided - - - -    Cognitive Function:     6CIT Screen 08/31/2016  What Year? 4  points  What month? 3 points  What time? 0 points  Count back from 20 0 points  Months in reverse 0 points    Screening Tests Health Maintenance  Topic Date Due  . ZOSTAVAX  09/10/2017 (Originally [birth year]18/1980)  . TETANUS/TDAP  09/10/2017 (Originally 05/19/1937)  . PNA vac Low Risk Adult (2 of 2 - PPSV23) 11/25/2016  . INFLUENZA VACCINE  Completed  . DEXA SCAN  Completed      Plan:    End of life planning; Advance aging; Advanced directives discussed. Copy of current HCPOA requested.  Medicare Attestation I have personally reviewed: The patient's medical and social history Their use of alcohol, tobacco or illicit drugs Their current medications and supplements The patient's functional ability including ADLs,fall risks, home safety risks, cognitive, and hearing and visual impairment Diet and physical activities Evidence for depression   The patient's weight, height, BMI, and visual acuity have been recorded in the chart.  I have made referrals and provided education to the patient  based on review of the above and I have provided the patient with a written personalized care plan for preventive services.    During the course of the visit, Heidi [name] was educated and counseled about the following appropriate screening and preventive services:   Vaccines to include Pneumoccal, Influenza, Hepatitis B, Td, Zostavax, HCV  Electrocardiogram  Cardiovascular disease screening  Colorectal cancer screening  Bone density screening  Diabetes screening  Glaucoma screening  Mammography/PAP  Nutrition counseling  Smoking cessation counseling  Patient Instructions (the written plan) were given to the patient.    Varney Biles, LPN   23/76/2831    Reviewed above information.  Agree with plan.  Dr Nicki Reaper

## 2016-08-31 NOTE — Patient Instructions (Addendum)
Heidi [name] , Thank you for taking time to come for your Medicare Wellness Visit. I appreciate your ongoing commitment to your health goals. Please review the following plan we discussed and let me know if I can assist you in the future.   Follow up with Dr. Nicki Reaper as needed.  Merry Christmas and Happy New Year!!  These are the goals we discussed: Goals    . Increase water intake          Stay hydrated and drink plenty of water/fluids       This is a list of the screening recommended for you and due dates:  Health Maintenance  Topic Date Due  . Shingles Vaccine  09/10/2017*  . Tetanus Vaccine  09/10/2017*  . Pneumonia vaccines (2 of 2 - PPSV23) 11/25/2016  . Flu Shot  Completed  . DEXA scan (bone density measurement)  Completed  *Topic was postponed. The date shown is not the original due date.      Fall Prevention in the Home Introduction Falls can cause injuries. They can happen to people of all ages. There are many things you can do to make your home safe and to help prevent falls. What can I do on the outside of my home?  Regularly fix the edges of walkways and driveways and fix any cracks.  Remove anything that might make you trip as you walk through a door, such as a raised step or threshold.  Trim any bushes or trees on the path to your home.  Use bright outdoor lighting.  Clear any walking paths of anything that might make someone trip, such as rocks or tools.  Regularly check to see if handrails are loose or broken. Make sure that both sides of any steps have handrails.  Any raised decks and porches should have guardrails on the edges.  Have any leaves, snow, or ice cleared regularly.  Use sand or salt on walking paths during winter.  Clean up any spills in your garage right away. This includes oil or grease spills. What can I do in the bathroom?  Use night lights.  Install grab bars by the toilet and in the tub and shower. Do not use towel bars as  grab bars.  Use non-skid mats or decals in the tub or shower.  If you need to sit down in the shower, use a plastic, non-slip stool.  Keep the floor dry. Clean up any water that spills on the floor as soon as it happens.  Remove soap buildup in the tub or shower regularly.  Attach bath mats securely with double-sided non-slip rug tape.  Do not have throw rugs and other things on the floor that can make you trip. What can I do in the bedroom?  Use night lights.  Make sure that you have a light by your bed that is easy to reach.  Do not use any sheets or blankets that are too big for your bed. They should not hang down onto the floor.  Have a firm chair that has side arms. You can use this for support while you get dressed.  Do not have throw rugs and other things on the floor that can make you trip. What can I do in the kitchen?  Clean up any spills right away.  Avoid walking on wet floors.  Keep items that you use a lot in easy-to-reach places.  If you need to reach something above you, use a strong step stool that has  a grab bar.  Keep electrical cords out of the way.  Do not use floor polish or wax that makes floors slippery. If you must use wax, use non-skid floor wax.  Do not have throw rugs and other things on the floor that can make you trip. What can I do with my stairs?  Do not leave any items on the stairs.  Make sure that there are handrails on both sides of the stairs and use them. Fix handrails that are broken or loose. Make sure that handrails are as long as the stairways.  Check any carpeting to make sure that it is firmly attached to the stairs. Fix any carpet that is loose or worn.  Avoid having throw rugs at the top or bottom of the stairs. If you do have throw rugs, attach them to the floor with carpet tape.  Make sure that you have a light switch at the top of the stairs and the bottom of the stairs. If you do not have them, ask someone to add them  for you. What else can I do to help prevent falls?  Wear shoes that:  Do not have high heels.  Have rubber bottoms.  Are comfortable and fit you well.  Are closed at the toe. Do not wear sandals.  If you use a stepladder:  Make sure that it is fully opened. Do not climb a closed stepladder.  Make sure that both sides of the stepladder are locked into place.  Ask someone to hold it for you, if possible.  Clearly mark and make sure that you can see:  Any grab bars or handrails.  First and last steps.  Where the edge of each step is.  Use tools that help you move around (mobility aids) if they are needed. These include:  Canes.  Walkers.  Scooters.  Crutches.  Turn on the lights when you go into a dark area. Replace any light bulbs as soon as they burn out.  Set up your furniture so you have a clear path. Avoid moving your furniture around.  If any of your floors are uneven, fix them.  If there are any pets around you, be aware of where they are.  Review your medicines with your doctor. Some medicines can make you feel dizzy. This can increase your chance of falling. Ask your doctor what other things that you can do to help prevent falls. This information is not intended to replace advice given to you by your health care provider. Make sure you discuss any questions you have with your health care provider. Document Released: 06/24/2009 Document Revised: 02/03/2016 Document Reviewed: 10/02/2014  2017 [employer]vier

## 2016-09-05 ENCOUNTER — Ambulatory Visit: Payer: Medicare Other | Admitting: Family

## 2016-10-09 ENCOUNTER — Other Ambulatory Visit: Payer: Self-pay | Admitting: Internal Medicine

## 2016-10-10 NOTE — Telephone Encounter (Signed)
Last Ov 08/09/16 last filled by Wynema Birch, PA-C 12/15/15  30 2rf

## 2016-10-11 NOTE — Telephone Encounter (Signed)
Please call and see if she feels the medication is helping. If so, then I will refill, but I would like to decrease to 7.5mg  q day (given her age and side effects of medication).    Can refill mobic 7.5mg  q ay #30 with no refills.

## 2016-10-11 NOTE — Telephone Encounter (Signed)
Please confirm with pts daughter if pt is still taking this medication and does she take it regularly.

## 2016-10-12 NOTE — Telephone Encounter (Signed)
Called daughter she is in pain even with the mediations. She wanted to know if there was something that you would rather have her take?

## 2016-10-13 MED ORDER — MELOXICAM 7.5 MG PO TABS: 7.5000 mg | ORAL_TABLET | Freq: Every day | ORAL | 0 refills | Status: DC

## 2016-10-13 NOTE — Telephone Encounter (Signed)
She has had injections, etc.  Has seen Dr Jefm Bryant.  Cannot take tramadol.  With bone on bone, not sure that medications will help.  Will ok meloxicam rx as previously stated.  Can use tylenol arthritis two tablets bid with the meloxicam and see if this helps.  Would space out.

## 2016-10-13 NOTE — Telephone Encounter (Signed)
Spoke with pt she will change to once a day and give [place] a call. Refill has been sent in.

## 2016-10-13 NOTE — Telephone Encounter (Signed)
Lm for daughter to call office.

## 2016-10-19 ENCOUNTER — Telehealth: Payer: Self-pay | Admitting: Internal Medicine

## 2016-10-19 DIAGNOSIS — M25519 Pain in unspecified shoulder: Secondary | ICD-10-CM

## 2016-10-19 DIAGNOSIS — M19019 Primary osteoarthritis, unspecified shoulder: Secondary | ICD-10-CM

## 2016-10-19 DIAGNOSIS — M1990 Unspecified osteoarthritis, unspecified site: Secondary | ICD-10-CM

## 2016-10-19 DIAGNOSIS — R531 Weakness: Secondary | ICD-10-CM

## 2016-10-19 NOTE — Telephone Encounter (Signed)
Heidi [name] lvm stating that she needs papers filled out for Heidi [name] to have home health come in to help. She wants to know if Dr.Scott can do that for her. cb [phone]

## 2016-10-20 NOTE — Telephone Encounter (Signed)
lmtrc

## 2016-10-23 NOTE — Telephone Encounter (Signed)
Please contact [name] [phone]

## 2016-10-24 NOTE — Telephone Encounter (Signed)
She is looking for home health

## 2016-10-24 NOTE — Telephone Encounter (Signed)
Do they need [place] to arrange home health or are they looking at getting her into an assisted living.  If assisted living, then they will decide what facility and have the facility forward paperwork.

## 2016-10-24 NOTE — Telephone Encounter (Signed)
Spoke to daughter she is having a lot of trouble with day to day living due to shoulder pain and other medical problems. She needs help with general with going to bathroom and general living skills   Pls call at [phone]

## 2016-10-24 NOTE — Telephone Encounter (Signed)
Lm to call office

## 2016-10-25 NOTE — Telephone Encounter (Signed)
Order placed for home health referral.  

## 2016-10-27 DIAGNOSIS — L84 Corns and callosities: Secondary | ICD-10-CM | POA: Diagnosis not present

## 2016-11-01 DIAGNOSIS — M064 Inflammatory polyarthropathy: Secondary | ICD-10-CM | POA: Diagnosis not present

## 2016-11-01 DIAGNOSIS — M19019 Primary osteoarthritis, unspecified shoulder: Secondary | ICD-10-CM | POA: Diagnosis not present

## 2016-11-02 DIAGNOSIS — M19019 Primary osteoarthritis, unspecified shoulder: Secondary | ICD-10-CM | POA: Diagnosis not present

## 2016-11-02 DIAGNOSIS — M064 Inflammatory polyarthropathy: Secondary | ICD-10-CM | POA: Diagnosis not present

## 2016-11-06 DIAGNOSIS — M064 Inflammatory polyarthropathy: Secondary | ICD-10-CM | POA: Diagnosis not present

## 2016-11-06 DIAGNOSIS — M19019 Primary osteoarthritis, unspecified shoulder: Secondary | ICD-10-CM | POA: Diagnosis not present

## 2016-11-07 DIAGNOSIS — M19019 Primary osteoarthritis, unspecified shoulder: Secondary | ICD-10-CM | POA: Diagnosis not present

## 2016-11-07 DIAGNOSIS — M064 Inflammatory polyarthropathy: Secondary | ICD-10-CM | POA: Diagnosis not present

## 2016-11-08 DIAGNOSIS — M19019 Primary osteoarthritis, unspecified shoulder: Secondary | ICD-10-CM | POA: Diagnosis not present

## 2016-11-08 DIAGNOSIS — M064 Inflammatory polyarthropathy: Secondary | ICD-10-CM | POA: Diagnosis not present

## 2016-11-13 ENCOUNTER — Encounter: Payer: Self-pay | Admitting: Internal Medicine

## 2016-11-13 ENCOUNTER — Ambulatory Visit (INDEPENDENT_AMBULATORY_CARE_PROVIDER_SITE_OTHER): Payer: Medicare Other | Admitting: Internal Medicine

## 2016-11-13 DIAGNOSIS — D649 Anemia, unspecified: Secondary | ICD-10-CM | POA: Diagnosis not present

## 2016-11-13 DIAGNOSIS — M19019 Primary osteoarthritis, unspecified shoulder: Secondary | ICD-10-CM | POA: Diagnosis not present

## 2016-11-13 DIAGNOSIS — M25519 Pain in unspecified shoulder: Secondary | ICD-10-CM | POA: Diagnosis not present

## 2016-11-13 DIAGNOSIS — R531 Weakness: Secondary | ICD-10-CM

## 2016-11-13 DIAGNOSIS — I10 Essential (primary) hypertension: Secondary | ICD-10-CM | POA: Diagnosis not present

## 2016-11-13 DIAGNOSIS — M1990 Unspecified osteoarthritis, unspecified site: Secondary | ICD-10-CM | POA: Diagnosis not present

## 2016-11-13 DIAGNOSIS — M064 Inflammatory polyarthropathy: Secondary | ICD-10-CM | POA: Diagnosis not present

## 2016-11-13 NOTE — Progress Notes (Signed)
Patient ID: Heidi [name], female   DOB: 06-Apr-1918, 83 y.o.   MRN: [id number]   Subjective:    Patient ID: Heidi [name], female    DOB: 1917-12-07, 83 y.o.   MRN: [id number]  HPI  Patient here for a scheduled follow up.  She is accompanied by her daughter.  History obtained from both of them.  She is having problems with doing her ADLs.  Also some weakness.  Harder to get around the house.  Discussed home health.  Daughter feels needs some help in the home.  Pt denies any chest pain.  Increased limitations with movement of her shoulders.  Breathing overall stable.  No acid reflux.  No abdominal pain.  Bowels moving.  Saw Dr Vickki Muff.  Callous.     Past Medical History:  Diagnosis Date  . Anemia   . Asthma   . GERD (gastroesophageal reflux disease)   . Hypertension   . Inflammatory arthritis    elevated ESR, negative temporal artery bx, low titer rheumatoid factor  . Nephrolithiasis   . OA (osteoarthritis)    Left knee replacement, hands, shoulders, cervial spine  . Osteoporosis    vitamin D deficiency, nasal miacalcin, reclast, previous rib fracture  . Pancreatitis    s/p cholecystectomy with ERCP and stone extraction  . Vitamin D deficiency    Past Surgical History:  Procedure Laterality Date  . ABDOMINAL HYSTERECTOMY     secondary  to bleeding  . LITHOTRIPSY    . REPLACEMENT TOTAL KNEE     Left knee replacement   Family History  Problem Relation Age of Onset  . Hypertension Mother   . Stroke Mother   . Stomach cancer Father   . Arthritis Brother   . Lung cancer Brother    Social History   Social History  . Marital status: Unknown    Spouse name: N/A  . Number of children: 5  . Years of education: N/A   Social History Main Topics  . Smoking status: Never Smoker  . Smokeless tobacco: Never Used  . Alcohol use No  . Drug use: No  . Sexual activity: Not Asked   Other Topics Concern  . None   Social History Narrative   She is widowed. She has five  children, three daughter and two son.    Outpatient Encounter Prescriptions as of 11/13/2016  Medication Sig  . acetaminophen (TYLENOL) 650 MG CR tablet Take 1,300 mg by mouth 2 (two) times daily.   Marland Kitchen amLODipine (NORVASC) 5 MG tablet Take 1 tablet (5 mg total) by mouth daily.  . Calcium Carbonate-Vitamin D (CALCIUM 600+D) 600-200 MG-UNIT TABS Take 1 tablet by mouth 2 (two) times daily.  . Cholecalciferol (VITAMIN D) 2000 UNITS tablet Take 2,000 Units by mouth daily.  . diclofenac sodium (VOLTAREN) 1 % GEL Apply 4 g topically 4 (four) times daily.  . metoprolol succinate (TOPROL-XL) 25 MG 24 hr tablet TAKE ONE HALF TABLET DAILY  . montelukast (SINGULAIR) 10 MG tablet TAKE 1 TABLET BY MOUTH ONCE DAILY.  Marland Kitchen thiamine (VITAMIN B-1) 100 MG tablet Take 100 mg by mouth daily.  . vitamin C (ASCORBIC ACID) 500 MG tablet Take 500 mg by mouth daily.  . vitamin E 400 UNIT capsule Take 400 Units by mouth daily.  . [DISCONTINUED] meloxicam (MOBIC) 7.5 MG tablet Take 1 tablet (7.5 mg total) by mouth daily.  . [DISCONTINUED] acetaminophen-codeine (TYLENOL #3) 300-30 MG tablet Take 1 tablet by mouth every 8 (eight) hours as needed  for moderate pain. (Patient not taking: Reported on 11/13/2016)  . [DISCONTINUED] meloxicam (MOBIC) 7.5 MG tablet Take 1 tablet (7.5 mg total) by mouth daily as needed for pain. (Patient not taking: Reported on 11/13/2016)   Facility-Administered Encounter Medications as of 11/13/2016  Medication  . pneumococcal 13-valent conjugate vaccine (PREVNAR 13) injection 0.5 mL    Review of Systems  Constitutional: Negative for appetite change and unexpected weight change.  HENT: Negative for congestion and sinus pressure.   Respiratory: Negative for cough, chest tightness and shortness of breath.   Cardiovascular: Negative for chest pain, palpitations and leg swelling.  Gastrointestinal: Negative for abdominal pain, diarrhea, nausea and vomiting.  Genitourinary: Negative for difficulty  urinating and dysuria.  Musculoskeletal: Negative for myalgias.       Shoulder pain as outlined.    Skin: Negative for color change and rash.  Neurological: Negative for dizziness, light-headedness and headaches.  Psychiatric/Behavioral: Negative for agitation and dysphoric mood.       Objective:    Physical Exam  Constitutional: She appears well-developed and well-nourished. No distress.  HENT:  Nose: Nose normal.  Mouth/Throat: Oropharynx is clear and moist.  Neck: Neck supple. No thyromegaly present.  Cardiovascular: Normal rate and regular rhythm.   Pulmonary/Chest: Breath sounds normal. No respiratory distress. She has no wheezes.  Abdominal: Soft. Bowel sounds are normal. There is no tenderness.  Musculoskeletal: She exhibits no edema or tenderness.  Lymphadenopathy:    She has no cervical adenopathy.  Skin: No rash noted. No erythema.  Psychiatric: She has a normal mood and affect. Her behavior is normal.    BP 136/68 (BP Location: Left Arm, Patient Position: Sitting, Cuff Size: Large)   Pulse 76   Temp 98.7 F (37.1 C) (Oral)   Resp 16   Ht 5' (1.524 m)   Wt 105 lb 6.4 oz (47.8 kg)   SpO2 93%   BMI 20.58 kg/m  Wt Readings from Last 3 Encounters:  11/13/16 105 lb 6.4 oz (47.8 kg)  08/31/16 104 lb 1.9 oz (47.2 kg)  08/09/16 103 lb 6.4 oz (46.9 kg)     Lab Results  Component Value Date   WBC 8.0 04/05/2016   HGB 12.0 04/05/2016   HCT 37.2 04/05/2016   PLT 247.0 04/05/2016   GLUCOSE 95 08/09/2016   ALT 13 04/05/2016   AST 18 04/05/2016   NA 142 08/09/2016   K 4.4 08/09/2016   CL 104 08/09/2016   CREATININE 1.03 08/09/2016   BUN 33 (H) 08/09/2016   CO2 30 08/09/2016   TSH 2.39 04/05/2016   INR 0.9 08/30/2013    Dg Ribs Unilateral W/chest Left  Result Date: 02/01/2016 CLINICAL DATA:  Acute onset of left-sided rib pain after fall. Initial encounter. EXAM: LEFT RIBS AND CHEST - 3+ VIEW COMPARISON:  Chest radiograph performed 04/14/2015 FINDINGS: No  displaced rib fractures are seen. Mild apparent underlying chronic rib deformities are seen bilaterally. The lungs are well-aerated and clear. There is no evidence of focal opacification, pleural effusion or pneumothorax. The cardiomediastinal silhouette is within normal limits. No acute osseous abnormalities are seen. Clips are noted within the right upper quadrant, reflecting prior cholecystectomy. IMPRESSION: No acute displaced rib fracture seen. Mild apparent underlying chronic rib deformities noted bilaterally. Electronically Signed   By: Garald Balding M.D.   On: 02/01/2016 20:36   Dg Shoulder Left  Result Date: 02/01/2016 CLINICAL DATA:  Status post fall with left shoulder pain. Initial encounter. EXAM: LEFT SHOULDER - 2+ VIEW COMPARISON:  Left shoulder radiographs performed 12/15/2015 FINDINGS: There is no evidence of fracture or dislocation. The left humeral head is seated within the glenoid fossa. Osteophyte formation is noted about the left humeral head, with chronic superior subluxation of the left humeral head. Prominent degenerative change is noted at the left acromioclavicular joint, with resorption of the distal left clavicle. No significant soft tissue abnormalities are seen. The visualized portions of the left lung are clear. IMPRESSION: 1. No evidence of fracture or dislocation. 2. 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 Electronically Signed   By: Garald Balding M.D.   On: 02/01/2016 20:38       Assessment & Plan:   Problem List Items Addressed This Visit    Anemia    Follow cbc.       Hypertension    Blood pressure under good control.  Continue same medication regimen.  Follow pressures.  Follow metabolic panel.        Inflammatory arthritis    Sees Dr Jefm Bryant.  Limits her activity.        Shoulder pain    Bilateral shoulder pain. Has seen Dr Jefm Bryant.  Has had injections.  Do not  help.  Increased pain.  Limits her activity.  Limits her being able to do her ADLs.  Set up home health for evaluation and treatment.        Weakness    Weakness as outlined.  Limited ability to get around her house.  Limited in her ADLs.  Arrange home health to evaluate and treat.            Einar Pheasant, MD

## 2016-11-13 NOTE — Progress Notes (Signed)
Pre-visit discussion using our clinic review tool. No additional management support is needed unless otherwise documented below in the visit note.  

## 2016-11-14 DIAGNOSIS — M19019 Primary osteoarthritis, unspecified shoulder: Secondary | ICD-10-CM | POA: Diagnosis not present

## 2016-11-14 DIAGNOSIS — M064 Inflammatory polyarthropathy: Secondary | ICD-10-CM | POA: Diagnosis not present

## 2016-11-15 ENCOUNTER — Telehealth: Payer: Self-pay

## 2016-11-15 DIAGNOSIS — M064 Inflammatory polyarthropathy: Secondary | ICD-10-CM | POA: Diagnosis not present

## 2016-11-15 DIAGNOSIS — Z7689 Persons encountering health services in other specified circumstances: Secondary | ICD-10-CM | POA: Diagnosis not present

## 2016-11-15 DIAGNOSIS — M19019 Primary osteoarthritis, unspecified shoulder: Secondary | ICD-10-CM | POA: Diagnosis not present

## 2016-11-15 NOTE — Telephone Encounter (Signed)
Copy faxed to [facility]. Copy put with charge sheet in folder. Original in scan box.

## 2016-11-15 NOTE — Telephone Encounter (Signed)
Form completed and placed in your box.  

## 2016-11-15 NOTE — Telephone Encounter (Signed)
received Home Health Certification received put in your red folder for review.

## 2016-11-19 ENCOUNTER — Encounter: Payer: Self-pay | Admitting: Internal Medicine

## 2016-11-19 DIAGNOSIS — R531 Weakness: Secondary | ICD-10-CM | POA: Insufficient documentation

## 2016-11-19 NOTE — Assessment & Plan Note (Signed)
Blood pressure under good control.  Continue same medication regimen.  Follow pressures.  Follow metabolic panel.   

## 2016-11-19 NOTE — Assessment & Plan Note (Signed)
Follow cbc.  

## 2016-11-19 NOTE — Assessment & Plan Note (Signed)
Bilateral shoulder pain. Has seen Dr Jefm Bryant.  Has had injections.  Do not help.  Increased pain.  Limits her activity.  Limits her being able to do her ADLs.  Set up home health for evaluation and treatment.

## 2016-11-19 NOTE — Assessment & Plan Note (Signed)
Weakness as outlined.  Limited ability to get around her house.  Limited in her ADLs.  Arrange home health to evaluate and treat.

## 2016-11-19 NOTE — Assessment & Plan Note (Addendum)
Sees Dr Jefm Bryant.  Limits her activity.

## 2016-11-21 ENCOUNTER — Other Ambulatory Visit: Payer: Self-pay | Admitting: Internal Medicine

## 2016-11-21 DIAGNOSIS — M19019 Primary osteoarthritis, unspecified shoulder: Secondary | ICD-10-CM

## 2016-11-21 DIAGNOSIS — M064 Inflammatory polyarthropathy: Secondary | ICD-10-CM | POA: Diagnosis not present

## 2016-11-28 DIAGNOSIS — Z85828 Personal history of other malignant neoplasm of skin: Secondary | ICD-10-CM | POA: Diagnosis not present

## 2016-11-28 DIAGNOSIS — Z08 Encounter for follow-up examination after completed treatment for malignant neoplasm: Secondary | ICD-10-CM | POA: Diagnosis not present

## 2016-11-28 DIAGNOSIS — I8393 Asymptomatic varicose veins of bilateral lower extremities: Secondary | ICD-10-CM | POA: Diagnosis not present

## 2016-12-07 DIAGNOSIS — M19019 Primary osteoarthritis, unspecified shoulder: Secondary | ICD-10-CM | POA: Diagnosis not present

## 2016-12-07 DIAGNOSIS — M064 Inflammatory polyarthropathy: Secondary | ICD-10-CM | POA: Diagnosis not present

## 2016-12-12 DIAGNOSIS — R590 Localized enlarged lymph nodes: Secondary | ICD-10-CM | POA: Diagnosis not present

## 2016-12-12 DIAGNOSIS — L739 Follicular disorder, unspecified: Secondary | ICD-10-CM | POA: Diagnosis not present

## 2016-12-27 DIAGNOSIS — M25512 Pain in left shoulder: Secondary | ICD-10-CM | POA: Diagnosis not present

## 2016-12-27 DIAGNOSIS — G8929 Other chronic pain: Secondary | ICD-10-CM | POA: Diagnosis not present

## 2017-01-22 ENCOUNTER — Other Ambulatory Visit: Payer: Self-pay | Admitting: Internal Medicine

## 2017-01-22 DIAGNOSIS — M19019 Primary osteoarthritis, unspecified shoulder: Secondary | ICD-10-CM

## 2017-01-30 ENCOUNTER — Encounter: Payer: Self-pay | Admitting: Emergency Medicine

## 2017-01-30 ENCOUNTER — Emergency Department: Payer: Medicare Other

## 2017-01-30 ENCOUNTER — Emergency Department
Admission: EM | Admit: 2017-01-30 | Discharge: 2017-01-30 | Disposition: A | Discharge status: Home / Self Care | Payer: Medicare Other | Attending: Emergency Medicine | Admitting: Emergency Medicine

## 2017-01-30 DIAGNOSIS — R519 Headache, unspecified: Secondary | ICD-10-CM

## 2017-01-30 DIAGNOSIS — Z79899 Other long term (current) drug therapy: Secondary | ICD-10-CM | POA: Insufficient documentation

## 2017-01-30 DIAGNOSIS — J45909 Unspecified asthma, uncomplicated: Secondary | ICD-10-CM | POA: Diagnosis not present

## 2017-01-30 DIAGNOSIS — R51 Headache: Secondary | ICD-10-CM | POA: Diagnosis not present

## 2017-01-30 DIAGNOSIS — I10 Essential (primary) hypertension: Secondary | ICD-10-CM | POA: Diagnosis not present

## 2017-01-30 DIAGNOSIS — M542 Cervicalgia: Secondary | ICD-10-CM | POA: Diagnosis not present

## 2017-01-30 LAB — BASIC METABOLIC PANEL
ANION GAP: 6 (ref 5–15)
BUN: 26 mg/dL — ABNORMAL HIGH (ref 6–20)
CALCIUM: 9.3 mg/dL (ref 8.9–10.3)
CO2: 29 mmol/L (ref 22–32)
Chloride: 104 mmol/L (ref 101–111)
Creatinine, Ser: 0.86 mg/dL (ref 0.44–1.00)
GFR, EST NON AFRICAN AMERICAN: 55 mL/min — AB (ref >60)
Glucose, Bld: 96 mg/dL (ref 65–99)
Potassium: 3.8 mmol/L (ref 3.5–5.1)
SODIUM: 139 mmol/L (ref 135–145)

## 2017-01-30 LAB — CBC WITH DIFFERENTIAL/PLATELET
BASOS ABS: 0 10*3/uL (ref 0–0.1)
BASOS PCT: 1 %
Eosinophils Absolute: 0 10*3/uL (ref 0–0.7)
Eosinophils Relative: 1 %
HEMATOCRIT: 33.8 % — AB (ref 35.0–47.0)
Hemoglobin: 11.2 g/dL — ABNORMAL LOW (ref 12.0–16.0)
Lymphocytes Relative: 23 %
Lymphs Abs: 1.4 10*3/uL (ref 1.0–3.6)
MCH: 31.6 pg (ref 26.0–34.0)
MCHC: 33.2 g/dL (ref 32.0–36.0)
MCV: 95.4 fL (ref 80.0–100.0)
MONO ABS: 0.7 10*3/uL (ref 0.2–0.9)
Monocytes Relative: 11 %
Neutro Abs: 4.1 10*3/uL (ref 1.4–6.5)
Neutrophils Relative %: 66 %
Platelets: 203 10*3/uL (ref 150–440)
RBC: 3.55 MIL/uL — ABNORMAL LOW (ref 3.80–5.20)
RDW: 12.1 % (ref 11.5–14.5)
WBC: 6.2 10*3/uL (ref 3.6–11.0)

## 2017-01-30 LAB — SEDIMENTATION RATE: Sed Rate: 16 mm/hr (ref 0–30)

## 2017-01-30 LAB — URINALYSIS, COMPLETE (UACMP) WITH MICROSCOPIC
Bilirubin Urine: NEGATIVE
GLUCOSE, UA: NEGATIVE mg/dL
Hgb urine dipstick: NEGATIVE
Ketones, ur: 5 mg/dL — AB
Leukocytes, UA: NEGATIVE
NITRITE: NEGATIVE
PH: 5 (ref 5.0–8.0)
Protein, ur: 30 mg/dL — AB
SPECIFIC GRAVITY, URINE: 1.036 — AB (ref 1.005–1.030)

## 2017-01-30 LAB — TROPONIN I

## 2017-01-30 MED ORDER — SODIUM CHLORIDE 0.9 % IV BOLUS (SEPSIS)
500.0000 mL | Freq: Once | INTRAVENOUS | Status: AC
  Administered 2017-01-30: 500 mL via INTRAVENOUS

## 2017-01-30 MED ORDER — IOPAMIDOL (ISOVUE-370) INJECTION 76%
75.0000 mL | Freq: Once | INTRAVENOUS | Status: AC | PRN
  Administered 2017-01-30: 75 mL via INTRAVENOUS

## 2017-01-30 NOTE — ED Notes (Signed)
ED Provider at bedside. 

## 2017-01-30 NOTE — ED Notes (Signed)
Patient transported to CT 

## 2017-01-30 NOTE — ED Triage Notes (Signed)
C/O headache to forehead, temples and down neck.  Onset of symptoms 1515.  Has not taken anything for pain.  Equal hand grips.  Equal facial movement.  Equal sensation.

## 2017-01-30 NOTE — ED Notes (Signed)
Pt assisted to toilet to urinate. Repositioned in bed. Family at bedside.

## 2017-01-30 NOTE — ED Notes (Signed)
FN: family reports headache acute onset around about one hour ago with no other sx at present time.

## 2017-01-30 NOTE — ED Provider Notes (Signed)
[facility] Emergency Department Provider Note  ___________________________________________   First MD Initiated Contact with Patient 01/30/17 1706     (approximate)  I have reviewed the triage vital signs and the nursing notes.   HISTORY  Chief Complaint Headache   HPI Heidi [name] is a 83 y.o. female with a history of hypertension was presenting to the emergency department today with a left-sided temporal headache. She said the headache started about 1 hour prior to arrival and lasted about half an hour. She says the pain was moderate to severe and came in for pulsatile waves. She denied any difficulty with her vision. Her daughter witnessed the episode and said that she was not having any difficulty speaking or any facial drooping. The patient says that she has no head pain at this time. However, does say that she feels like her head is "heavy." Says that she intermittently has aches and pains and has had similar pain on the right side.   Past Medical History:  Diagnosis Date  . Anemia   . Asthma   . GERD (gastroesophageal reflux disease)   . Hypertension   . Inflammatory arthritis    elevated ESR, negative temporal artery bx, low titer rheumatoid factor  . Nephrolithiasis   . OA (osteoarthritis)    Left knee replacement, hands, shoulders, cervial spine  . Osteoporosis    vitamin D deficiency, nasal miacalcin, reclast, previous rib fracture  . Pancreatitis    s/p cholecystectomy with ERCP and stone extraction  . Vitamin D deficiency     Patient Active Problem List   Diagnosis Date Noted  . Weakness 11/19/2016  . Visit for suture removal 07/07/2016  . Skin lesion of cheek 04/10/2015  . Gas 04/10/2015  . Loss of weight 04/08/2015  . Rib pain on right side 08/16/2014  . Shoulder pain 08/16/2014  . Lower extremity edema 01/20/2014  . Leg pain 01/20/2014  . Rash 09/12/2013  . Urinary frequency 09/12/2013  . Headache 02/19/2013  .  Dizziness 02/19/2013  . Inflammatory arthritis 08/16/2012  . Osteoarthritis 08/16/2012  . Osteoporosis 08/16/2012  . Anemia 08/16/2012  . GERD (gastroesophageal reflux disease) 08/16/2012  . Hypertension 08/16/2012    Past Surgical History:  Procedure Laterality Date  . ABDOMINAL HYSTERECTOMY     secondary  to bleeding  . LITHOTRIPSY    . REPLACEMENT TOTAL KNEE     Left knee replacement    Prior to Admission medications   Medication Sig Start Date End Date Taking? Authorizing Provider  acetaminophen (TYLENOL) 650 MG CR tablet Take 1,300 mg by mouth 2 (two) times daily.     [provider]  amLODipine (NORVASC) 5 MG tablet Take 1 tablet (5 mg total) by mouth daily. 06/09/16   Einar Pheasant, MD  Calcium Carbonate-Vitamin D (CALCIUM 600+D) 600-200 MG-UNIT TABS Take 1 tablet by mouth 2 (two) times daily.    [provider]  Cholecalciferol (VITAMIN D) 2000 UNITS tablet Take 2,000 Units by mouth daily.    [provider]  metoprolol succinate (TOPROL-XL) 25 MG 24 hr tablet TAKE ONE HALF TABLET DAILY 06/20/16   Einar Pheasant, MD  montelukast (SINGULAIR) 10 MG tablet TAKE 1 TABLET BY MOUTH ONCE DAILY. 07/18/16   Einar Pheasant, MD  thiamine (VITAMIN B-1) 100 MG tablet Take 100 mg by mouth daily.    [provider]  vitamin C (ASCORBIC ACID) 500 MG tablet Take 500 mg by mouth daily.    [provider]  vitamin  E 400 UNIT capsule Take 400 Units by mouth daily.    [provider]  VOLTAREN 1 % GEL APPLY 4 GRAMS TOPICALLY FOUR TIMES DAILY 01/22/17   Einar Pheasant, MD    Allergies Anhydrous base; Aspirin; Carbapenems; Cephalosporins; Levaquin [levofloxacin in d5w]; Penicillins; Sulfa antibiotics; Sulfonylureas; Thiazide-type diuretics; Tramadol; Macrobid [nitrofurantoin monohyd macro]; and Omnicef [cefdinir]  Family History  Problem Relation Age of Onset  . Hypertension Mother   . Stroke Mother   . Stomach cancer Father   .  Arthritis Brother   . Lung cancer Brother     Social History Social History  Substance Use Topics  . Smoking status: Never Smoker  . Smokeless tobacco: Never Used  . Alcohol use No    Review of Systems  Constitutional: No fever/chills Eyes: No visual changes. ENT: No sore throat. Cardiovascular: Denies chest pain. Respiratory: Denies shortness of breath. Gastrointestinal: No abdominal pain.  No nausea, no vomiting.  No diarrhea.  No constipation. Genitourinary: Negative for dysuria. Musculoskeletal: Negative for back pain. Skin: Negative for rash. Neurological: Negative for focal weakness or numbness.   ____________________________________________   PHYSICAL EXAM:  VITAL SIGNS: ED Triage Vitals  Enc Vitals Group     BP 01/30/17 1642 137/70     Pulse Rate 01/30/17 1642 94     Resp 01/30/17 1642 16     Temp 01/30/17 1642 98 F (36.7 C)     Temp Source 01/30/17 1642 Oral     SpO2 01/30/17 1642 97 %     Weight 01/30/17 1643 103 lb (46.7 kg)     Height 01/30/17 1643 5' (1.524 m)     Head Circumference --      Peak Flow --      Pain Score 01/30/17 1641 1     Pain Loc --      Pain Edu? --      Excl. in [place]? --     Constitutional: Alert and oriented. Well appearing and in no acute distress. Eyes: Conjunctivae are normal.  Head: Atraumatic.No tenderness along the distribution of the left temporal artery. Nose: No congestion/rhinnorhea. Mouth/Throat: Mucous membranes are moist.  Neck: No stridor.  No tenderness to palpation. Ranges head and neck freely. Cardiovascular: Normal rate, regular rhythm. Grossly normal heart sounds.   Respiratory: Normal respiratory effort.  No retractions. Lungs CTAB. Gastrointestinal: Soft and nontender. No distention. No CVA tenderness. Musculoskeletal: No lower extremity tenderness nor edema.  No joint effusions. Neurologic:  Normal speech and language. No gross focal neurologic deficits are appreciated. Skin:  Skin is warm, dry and  intact. No rash noted. Psychiatric: Mood and affect are normal. Speech and behavior are normal.  ____________________________________________   LABS (all labs ordered are listed, but only abnormal results are displayed)  Labs Reviewed  CBC WITH DIFFERENTIAL/PLATELET  BASIC METABOLIC PANEL  TROPONIN I  URINALYSIS, COMPLETE (UACMP) WITH MICROSCOPIC  SEDIMENTATION RATE   ____________________________________________  EKG  ED ECG REPORT I, Schaevitz,  Youlanda Roys, the attending physician, personally viewed and interpreted this ECG.   Date: 01/30/2017  EKG Time: 1839  Rate: 75  Rhythm: normal sinus rhythm  Axis: normal  Intervals:none  ST&T Change: No ST segment elevation or depression. No abnormal T-wave inversion.  ____________________________________________  RADIOLOGY  No acute findings on the CAT scans. ____________________________________________   PROCEDURES  Procedure(s) performed:   Procedures  Critical Care performed:   ____________________________________________   INITIAL IMPRESSION / ASSESSMENT AND PLAN / ED COURSE  Pertinent labs & imaging results  that were available during my care of the patient were reviewed by me and considered in my medical decision making (see chart for details).    Clinical Course as of Jan 30 2053  Tue Jan 30, 2017  2053 Patient continues to be without any headache. No longer reporting a "heavy" sensation in her head that she had been reporting before. Patient with reassuring lab work. Bacteria on the urinalysis but no other signs of UTI. We will send for culture. I will not treat her acutely for UTI at this time. Less likely also to be giant cell arteritis. There was no tenderness or nodularity to the bilateral temporal regions. Unclear cause of the pain, but symptoms appear to be resolved and with a very reassuring workup. I discussed the case with the family and the patient as well as results and they're understanding and will  comply.  [DS]    Clinical Course User Index [DS] Orbie Pyo, MD     ____________________________________________   FINAL CLINICAL IMPRESSION(S) / ED DIAGNOSES  Headache.    NEW MEDICATIONS STARTED DURING THIS VISIT:  New Prescriptions   No medications on file     Note:  This document was prepared using Dragon voice recognition software and may include unintentional dictation errors.     Orbie Pyo, MD 01/30/17 2055

## 2017-01-30 NOTE — ED Notes (Signed)
Pt assisted to toilet 

## 2017-01-30 NOTE — ED Notes (Signed)
Pt assisted to toilet to urinate.daughter at bedside.

## 2017-01-30 NOTE — ED Notes (Signed)
Pt assisted to urinate on toilet, given pillow, given warm blanket and repositioned in bed. Daughter at bedside.

## 2017-02-02 LAB — URINE CULTURE

## 2017-02-03 NOTE — Progress Notes (Signed)
ED Antimicrobial Stewardship Positive Culture Follow Up   Heidi [name] is an 83 y.o. female who presented to [facility] on 01/30/2017 with a chief complaint of  Chief Complaint  Patient presents with  . Headache   ER visit:  WBC 6.2,  Afebrile Allergies  Allergen Reactions  . Anhydrous Base Other (See Comments)  . Aspirin   . Carbapenems Other (See Comments)  . Cephalosporins Other (See Comments)  . Levaquin [Levofloxacin In D5w]     Questionable rash   . Penicillins   . Sulfa Antibiotics   . Sulfonylureas Other (See Comments)  . Thiazide-Type Diuretics Other (See Comments)  . Tramadol Other (See Comments)    Dizziness   . Macrobid [Nitrofurantoin Monohyd Macro] Rash  . Omnicef [Cefdinir] Rash   Recent Results (from the past 720 hour(s))  Urine culture     Status: Abnormal   Collection Time: 01/30/17  6:16 PM  Result Value Ref Range Status   Specimen Description URINE, RANDOM  Final   Special Requests NONE  Final   Culture >=100,000 COLONIES/mL ESCHERICHIA COLI (A)  Final   Report Status 02/02/2017 FINAL  Final   Organism ID, Bacteria ESCHERICHIA COLI (A)  Final      Susceptibility   Escherichia coli - MIC*    AMPICILLIN 8 SENSITIVE Sensitive     CEFAZOLIN <=4 SENSITIVE Sensitive     CEFTRIAXONE <=1 SENSITIVE Sensitive     CIPROFLOXACIN <=0.25 SENSITIVE Sensitive     GENTAMICIN <=1 SENSITIVE Sensitive     IMIPENEM <=0.25 SENSITIVE Sensitive     NITROFURANTOIN <=16 SENSITIVE Sensitive     TRIMETH/SULFA <=20 SENSITIVE Sensitive     AMPICILLIN/SULBACTAM 4 SENSITIVE Sensitive     PIP/TAZO <=4 SENSITIVE Sensitive     Extended ESBL NEGATIVE Sensitive     * >=100,000 COLONIES/mL ESCHERICHIA COLI     [x]  Patient discharged originally without antimicrobial agent and treatment might now be indicated  Per discussion with ED MD: Called patient to explain that MD would like her to either return to the ED or see her PCP. [phone] (patient home ph #) Spoke with  [name] who then had me speak to her son. (Patient got flustered when she could not remember her PCP's name or where they are located). Explained to son about need for follow-up given Urine cx and patient's multiple antibiotic allergies.  Per son, patient does not appear to be having typical symptoms associated with a UTI, no fever, urinary issues, etc. Son stated he would either bring patient to PCP at [facility] or back to the ER.    ED Provider: Hassell Done A 02/03/2017, 3:06 PM

## 2017-02-06 ENCOUNTER — Telehealth: Payer: Self-pay | Admitting: *Deleted

## 2017-02-06 DIAGNOSIS — N390 Urinary tract infection, site not specified: Secondary | ICD-10-CM | POA: Diagnosis not present

## 2017-02-06 NOTE — Telephone Encounter (Signed)
Please call [phone]

## 2017-02-06 NOTE — Telephone Encounter (Signed)
Patient was seen at [facility] emergency 01/30/17. Pt was advised to return for further testing for a possible UTI or schedule an an appt with Dr. Nicki Reaper. Please advise  Contact [name] [phone]

## 2017-02-06 NOTE — Telephone Encounter (Signed)
Left message to return call to our office.  

## 2017-02-07 NOTE — Telephone Encounter (Signed)
Pt's daughter called, she took her mother to [facility] and does not need an appointment.

## 2017-03-20 ENCOUNTER — Ambulatory Visit (INDEPENDENT_AMBULATORY_CARE_PROVIDER_SITE_OTHER): Payer: Medicare Other | Admitting: Internal Medicine

## 2017-03-20 ENCOUNTER — Encounter: Payer: Self-pay | Admitting: Internal Medicine

## 2017-03-20 DIAGNOSIS — R634 Abnormal weight loss: Secondary | ICD-10-CM

## 2017-03-20 DIAGNOSIS — M25519 Pain in unspecified shoulder: Secondary | ICD-10-CM

## 2017-03-20 DIAGNOSIS — K219 Gastro-esophageal reflux disease without esophagitis: Secondary | ICD-10-CM | POA: Diagnosis not present

## 2017-03-20 DIAGNOSIS — M810 Age-related osteoporosis without current pathological fracture: Secondary | ICD-10-CM

## 2017-03-20 DIAGNOSIS — I10 Essential (primary) hypertension: Secondary | ICD-10-CM

## 2017-03-20 DIAGNOSIS — D649 Anemia, unspecified: Secondary | ICD-10-CM

## 2017-03-20 DIAGNOSIS — M19019 Primary osteoarthritis, unspecified shoulder: Secondary | ICD-10-CM

## 2017-03-20 DIAGNOSIS — M1990 Unspecified osteoarthritis, unspecified site: Secondary | ICD-10-CM | POA: Diagnosis not present

## 2017-03-20 NOTE — Progress Notes (Signed)
Pre-visit discussion using our clinic review tool. No additional management support is needed unless otherwise documented below in the visit note.  

## 2017-03-20 NOTE — Progress Notes (Signed)
Patient ID: Heidi [name], female   DOB: [birth year], 83 y.o.   MRN: [id number]   Subjective:    Patient ID: Heidi [name], female    DOB: 10/12/1917, 83 y.o.   MRN: [id number]  HPI  Patient here for a scheduled follow up.  She is accompanied by her daughter.  History obtained from both of them.  She is doing relatively well.  Moves around her house.  Still with some shoulder pain.  Has been afraid to use the voltaren gel - stating concerned in caused cancer.  Tolerated when she used it.  Helped her shoulders some.  Discussed using today.  No chest pain.  No sob.  No abdominal pain.  Bowels stable.     Past Medical History:  Diagnosis Date  . Anemia   . Asthma   . GERD (gastroesophageal reflux disease)   . Hypertension   . Inflammatory arthritis    elevated ESR, negative temporal artery bx, low titer rheumatoid factor  . Nephrolithiasis   . OA (osteoarthritis)    Left knee replacement, hands, shoulders, cervial spine  . Osteoporosis    vitamin D deficiency, nasal miacalcin, reclast, previous rib fracture  . Pancreatitis    s/p cholecystectomy with ERCP and stone extraction  . Vitamin D deficiency    Past Surgical History:  Procedure Laterality Date  . ABDOMINAL HYSTERECTOMY     secondary  to bleeding  . LITHOTRIPSY    . REPLACEMENT TOTAL KNEE     Left knee replacement   Family History  Problem Relation Age of Onset  . Hypertension Mother   . Stroke Mother   . Stomach cancer Father   . Arthritis Brother   . Lung cancer Brother    Social History   Social History  . Marital status: Unknown    Spouse name: N/A  . Number of children: 5  . Years of education: N/A   Social History Main Topics  . Smoking status: Never Smoker  . Smokeless tobacco: Never Used  . Alcohol use No  . Drug use: No  . Sexual activity: Not Asked   Other Topics Concern  . None   Social History Narrative   She is widowed. She has five children, three daughter and two son.     Outpatient Encounter Prescriptions as of 03/20/2017  Medication Sig  . acetaminophen (TYLENOL) 650 MG CR tablet Take 1,300 mg by mouth 2 (two) times daily.   Marland Kitchen amLODipine (NORVASC) 5 MG tablet Take 1 tablet (5 mg total) by mouth daily.  Marland Kitchen aspirin EC 81 MG tablet Take 81 mg by mouth daily.  . B Complex-C (B-COMPLEX WITH VITAMIN C) tablet Take 1 tablet by mouth daily.  . Calcium Carbonate-Vitamin D (CALCIUM 600+D) 600-200 MG-UNIT TABS Take 1 tablet by mouth 2 (two) times daily.  . Cholecalciferol (VITAMIN D) 2000 UNITS tablet Take 2,000 Units by mouth daily.  . diclofenac sodium (VOLTAREN) 1 % GEL APPLY 4 GRAMS TOPICALLY TWO TIMES DAILY  . metoprolol succinate (TOPROL-XL) 25 MG 24 hr tablet TAKE ONE HALF TABLET DAILY  . montelukast (SINGULAIR) 10 MG tablet Take 1 tablet (10 mg total) by mouth daily.  . vitamin C (ASCORBIC ACID) 500 MG tablet Take 500 mg by mouth daily.  . vitamin E 400 UNIT capsule Take 400 Units by mouth daily.  . [DISCONTINUED] amLODipine (NORVASC) 5 MG tablet Take 1 tablet (5 mg total) by mouth daily.  . [DISCONTINUED] metoprolol succinate (TOPROL-XL) 25 MG 24 hr tablet TAKE  ONE HALF TABLET DAILY  . [DISCONTINUED] montelukast (SINGULAIR) 10 MG tablet TAKE 1 TABLET BY MOUTH ONCE DAILY.  . [DISCONTINUED] VOLTAREN 1 % GEL APPLY 4 GRAMS TOPICALLY FOUR TIMES DAILY   Facility-Administered Encounter Medications as of 03/20/2017  Medication  . pneumococcal 13-valent conjugate vaccine (PREVNAR 13) injection 0.5 mL    Review of Systems  Constitutional: Negative for appetite change and unexpected weight change.  HENT: Negative for congestion and sinus pressure.   Respiratory: Negative for cough, chest tightness and shortness of breath.   Cardiovascular: Negative for chest pain, palpitations and leg swelling.  Gastrointestinal: Negative for abdominal pain, diarrhea, nausea and vomiting.  Genitourinary: Negative for difficulty urinating and dysuria.  Musculoskeletal:        Shoulder pain as outlined.    Skin: Negative for color change and rash.  Neurological: Negative for dizziness, light-headedness and headaches.  Psychiatric/Behavioral: Negative for agitation and dysphoric mood.       Objective:    Physical Exam  Constitutional: She appears well-developed and well-nourished. No distress.  HENT:  Nose: Nose normal.  Mouth/Throat: Oropharynx is clear and moist.  Neck: Neck supple. No thyromegaly present.  Cardiovascular: Normal rate and regular rhythm.   Pulmonary/Chest: Breath sounds normal. No respiratory distress. She has no wheezes.  Abdominal: Soft. Bowel sounds are normal. There is no tenderness.  Musculoskeletal: She exhibits no edema or tenderness.  Limited rom - shoulders.    Lymphadenopathy:    She has no cervical adenopathy.  Skin: No rash noted. No erythema.  Psychiatric: She has a normal mood and affect. Her behavior is normal.    BP (!) 144/60 (BP Location: Left Arm, Patient Position: Sitting, Cuff Size: Normal)   Pulse 73   Temp 98.6 F (37 C) (Oral)   Resp 12   Ht 5' (1.524 m)   Wt 102 lb 9.6 oz (46.5 kg)   SpO2 98%   BMI 20.04 kg/m  Wt Readings from Last 3 Encounters:  03/20/17 102 lb 9.6 oz (46.5 kg)  01/30/17 103 lb (46.7 kg)  11/13/16 105 lb 6.4 oz (47.8 kg)     Lab Results  Component Value Date   WBC 6.2 01/30/2017   HGB 11.2 (L) 01/30/2017   HCT 33.8 (L) 01/30/2017   PLT 203 01/30/2017   GLUCOSE 96 01/30/2017   ALT 13 04/05/2016   AST 18 04/05/2016   NA 139 01/30/2017   K 3.8 01/30/2017   CL 104 01/30/2017   CREATININE 0.86 01/30/2017   BUN 26 (H) 01/30/2017   CO2 29 01/30/2017   TSH 2.39 04/05/2016   INR 0.9 08/30/2013    Ct Angio Head W Or Wo Contrast  Result Date: 01/30/2017 CLINICAL DATA:  New onset headache radiating to neck started at 1515 hours History of hypertension. EXAM: CT ANGIOGRAPHY HEAD AND NECK TECHNIQUE: Multidetector CT imaging of the head and neck was performed using the standard  protocol during bolus administration of intravenous contrast. Multiplanar CT image reconstructions and MIPs were obtained to evaluate the vascular anatomy. Carotid stenosis measurements (when applicable) are obtained utilizing NASCET criteria, using the distal internal carotid diameter as the denominator. CONTRAST:  75 cc Isovue 370 COMPARISON:  CT HEAD and neck August 08, 2014 FINDINGS: CT HEAD FINDINGS BRAIN: No intraparenchymal hemorrhage, mass effect nor midline shift. The ventricles and sulci are normal for age. Patchy supratentorial white matter hypodensities within normal range for patient's age, though non-specific are most compatible with chronic small vessel ischemic disease. Old basal ganglia lacunar  infarcts. No acute large vascular territory infarcts. No abnormal extra-axial fluid collections. Basal cisterns are patent. VASCULAR: Moderate calcific atherosclerosis of the carotid siphons. SKULL: No skull fracture. Osteopenia. No significant scalp soft tissue swelling. SINUSES/ORBITS: Chronic LEFT sphenoid sinusitis. Mastoid air cells are well aerated. The included ocular globes and orbital contents are non-suspicious. Status post bilateral ocular lens implants. OTHER: Patient is edentulous. CTA NECK AORTIC ARCH: Normal appearance of the thoracic arch, normal branch pattern. Mild calcific atherosclerosis of the aortic arch. The origins of the innominate, left Common carotid artery and subclavian artery are widely patent. RIGHT CAROTID SYSTEM: Common carotid artery is widely patent. Normal appearance of the carotid bifurcation without hemodynamically significant stenosis by NASCET criteria, mild calcific atherosclerosis. Normal appearance of the included internal carotid artery, tonsillar loop. LEFT CAROTID SYSTEM: Common carotid artery is widely patent. Normal appearance of the carotid bifurcation without hemodynamically significant stenosis by NASCET criteria, mild calcific atherosclerosis. Normal  appearance of the included internal carotid artery, tonsillar loop. VERTEBRAL ARTERIES:RIGHT vertebral artery is dominant. Patent bilateral vertebral arteries with mild extrinsic compression due to degenerative cervical spine. Tortuous vertebral artery's associated with chronic hypertension. SKELETON: No acute osseous process though bone windows have not been submitted. Severe degenerative change of the shoulders with multiple loose bodies and intra-articular calcifications suggesting CPPD or synovial osteochondromatosis. Osteopenia and severe cervical spondylosis. OTHER NECK: Soft tissues of the neck are non-acute though, not tailored for evaluation. CTA HEAD ANTERIOR CIRCULATION: Patent cervical internal carotid arteries, petrous, cavernous and supra clinoid internal carotid arteries. Widely patent anterior communicating artery. Patent anterior and middle cerebral arteries. No large vessel occlusion, hemodynamically significant stenosis, dissection, luminal irregularity, contrast extravasation or aneurysm. POSTERIOR CIRCULATION: Patent vertebral arteries, vertebrobasilar junction and basilar artery, as well as main branch vessels. Patent posterior cerebral arteries. No large vessel occlusion, hemodynamically significant stenosis, dissection, luminal irregularity, contrast extravasation or aneurysm. VENOUS SINUSES: Major dural venous sinuses are patent though not tailored for evaluation on this angiographic examination. ANATOMIC VARIANTS: None. DELAYED PHASE: No abnormal intracranial enhancement. MIP images reviewed. IMPRESSION: CT HEAD: No acute intracranial process. Stable examination including moderate chronic small vessel ischemic disease and old basal ganglia lacunar infarcts. CTA NECK: Mild atherosclerosis without hemodynamically significant stenosis or acute vascular process. CTA HEAD:  No emergent large vessel occlusion or stenosis. Electronically Signed   By: Elon Alas M.D.   On: 01/30/2017 19:58    Ct Angio Neck W And/or Wo Contrast  Result Date: 01/30/2017 CLINICAL DATA:  New onset headache radiating to neck started at 1515 hours History of hypertension. EXAM: CT ANGIOGRAPHY HEAD AND NECK TECHNIQUE: Multidetector CT imaging of the head and neck was performed using the standard protocol during bolus administration of intravenous contrast. Multiplanar CT image reconstructions and MIPs were obtained to evaluate the vascular anatomy. Carotid stenosis measurements (when applicable) are obtained utilizing NASCET criteria, using the distal internal carotid diameter as the denominator. CONTRAST:  75 cc Isovue 370 COMPARISON:  CT HEAD and neck August 08, 2014 FINDINGS: CT HEAD FINDINGS BRAIN: No intraparenchymal hemorrhage, mass effect nor midline shift. The ventricles and sulci are normal for age. Patchy supratentorial white matter hypodensities within normal range for patient's age, though non-specific are most compatible with chronic small vessel ischemic disease. Old basal ganglia lacunar infarcts. No acute large vascular territory infarcts. No abnormal extra-axial fluid collections. Basal cisterns are patent. VASCULAR: Moderate calcific atherosclerosis of the carotid siphons. SKULL: No skull fracture. Osteopenia. No significant scalp soft tissue swelling. SINUSES/ORBITS: Chronic LEFT sphenoid sinusitis.  Mastoid air cells are well aerated. The included ocular globes and orbital contents are non-suspicious. Status post bilateral ocular lens implants. OTHER: Patient is edentulous. CTA NECK AORTIC ARCH: Normal appearance of the thoracic arch, normal branch pattern. Mild calcific atherosclerosis of the aortic arch. The origins of the innominate, left Common carotid artery and subclavian artery are widely patent. RIGHT CAROTID SYSTEM: Common carotid artery is widely patent. Normal appearance of the carotid bifurcation without hemodynamically significant stenosis by NASCET criteria, mild calcific  atherosclerosis. Normal appearance of the included internal carotid artery, tonsillar loop. LEFT CAROTID SYSTEM: Common carotid artery is widely patent. Normal appearance of the carotid bifurcation without hemodynamically significant stenosis by NASCET criteria, mild calcific atherosclerosis. Normal appearance of the included internal carotid artery, tonsillar loop. VERTEBRAL ARTERIES:RIGHT vertebral artery is dominant. Patent bilateral vertebral arteries with mild extrinsic compression due to degenerative cervical spine. Tortuous vertebral artery's associated with chronic hypertension. SKELETON: No acute osseous process though bone windows have not been submitted. Severe degenerative change of the shoulders with multiple loose bodies and intra-articular calcifications suggesting CPPD or synovial osteochondromatosis. Osteopenia and severe cervical spondylosis. OTHER NECK: Soft tissues of the neck are non-acute though, not tailored for evaluation. CTA HEAD ANTERIOR CIRCULATION: Patent cervical internal carotid arteries, petrous, cavernous and supra clinoid internal carotid arteries. Widely patent anterior communicating artery. Patent anterior and middle cerebral arteries. No large vessel occlusion, hemodynamically significant stenosis, dissection, luminal irregularity, contrast extravasation or aneurysm. POSTERIOR CIRCULATION: Patent vertebral arteries, vertebrobasilar junction and basilar artery, as well as main branch vessels. Patent posterior cerebral arteries. No large vessel occlusion, hemodynamically significant stenosis, dissection, luminal irregularity, contrast extravasation or aneurysm. VENOUS SINUSES: Major dural venous sinuses are patent though not tailored for evaluation on this angiographic examination. ANATOMIC VARIANTS: None. DELAYED PHASE: No abnormal intracranial enhancement. MIP images reviewed. IMPRESSION: CT HEAD: No acute intracranial process. Stable examination including moderate chronic small  vessel ischemic disease and old basal ganglia lacunar infarcts. CTA NECK: Mild atherosclerosis without hemodynamically significant stenosis or acute vascular process. CTA HEAD:  No emergent large vessel occlusion or stenosis. Electronically Signed   By: Elon Alas M.D.   On: 01/30/2017 19:58       Assessment & Plan:   Problem List Items Addressed This Visit    Anemia    Follow cbc.        GERD (gastroesophageal reflux disease)    Symptoms controlled on omeprazole.        Hypertension    Blood pressure under reasonable control.  Follow pressures.  Same medication regimen.  Follow.       Relevant Medications   amLODipine (NORVASC) 5 MG tablet   metoprolol succinate (TOPROL-XL) 25 MG 24 hr tablet   Inflammatory arthritis    Sees Dr Jefm Bryant.        Loss of weight    Weight stable from the last check.  Follow.       Osteoporosis    Has received reclast.  Continue vitamin d.        Shoulder pain    Sees Dr Jefm Bryant.  Bilateral shoulder pain.  voltaren gel as directed.  Follow.        Other Visit Diagnoses    Shoulder arthritis       Relevant Medications   diclofenac sodium (VOLTAREN) 1 % GEL       Einar Pheasant, MD

## 2017-03-21 ENCOUNTER — Encounter: Payer: Self-pay | Admitting: Internal Medicine

## 2017-03-21 MED ORDER — METOPROLOL SUCCINATE ER 25 MG PO TB24: ORAL_TABLET | ORAL | 3 refills | Status: DC

## 2017-03-21 MED ORDER — MONTELUKAST SODIUM 10 MG PO TABS: 10.0000 mg | ORAL_TABLET | Freq: Every day | ORAL | 11 refills | Status: DC

## 2017-03-21 MED ORDER — DICLOFENAC SODIUM 1 % TD GEL: TRANSDERMAL | 0 refills | Status: DC

## 2017-03-21 MED ORDER — AMLODIPINE BESYLATE 5 MG PO TABS: 5.0000 mg | ORAL_TABLET | Freq: Every day | ORAL | 3 refills | Status: DC

## 2017-03-21 NOTE — Assessment & Plan Note (Signed)
Sees Dr Jefm Bryant.

## 2017-03-21 NOTE — Assessment & Plan Note (Signed)
Weight stable from the last check.  Follow.

## 2017-03-21 NOTE — Assessment & Plan Note (Signed)
Blood pressure under reasonable control.  Follow pressures.  Same medication regimen.  Follow.   

## 2017-03-21 NOTE — Assessment & Plan Note (Signed)
Has received reclast.  Continue vitamin d.

## 2017-03-21 NOTE — Assessment & Plan Note (Signed)
Follow cbc.  

## 2017-03-21 NOTE — Assessment & Plan Note (Signed)
Sees Dr Jefm Bryant.  Bilateral shoulder pain.  voltaren gel as directed.  Follow.

## 2017-03-21 NOTE — Assessment & Plan Note (Signed)
Symptoms controlled on omeprazole.   

## 2017-05-07 ENCOUNTER — Emergency Department: Payer: Medicare Other

## 2017-05-07 ENCOUNTER — Encounter: Payer: Self-pay | Admitting: Medical Oncology

## 2017-05-07 ENCOUNTER — Emergency Department
Admission: EM | Admit: 2017-05-07 | Discharge: 2017-05-07 | Disposition: A | Discharge status: Home / Self Care | Payer: Medicare Other | Attending: Emergency Medicine | Admitting: Emergency Medicine

## 2017-05-07 DIAGNOSIS — W19XXXA Unspecified fall, initial encounter: Secondary | ICD-10-CM

## 2017-05-07 DIAGNOSIS — W01190A Fall on same level from slipping, tripping and stumbling with subsequent striking against furniture, initial encounter: Secondary | ICD-10-CM | POA: Insufficient documentation

## 2017-05-07 DIAGNOSIS — Y92003 Bedroom of unspecified non-institutional (private) residence as the place of occurrence of the external cause: Secondary | ICD-10-CM | POA: Insufficient documentation

## 2017-05-07 DIAGNOSIS — S60212A Contusion of left wrist, initial encounter: Secondary | ICD-10-CM | POA: Insufficient documentation

## 2017-05-07 DIAGNOSIS — S20211A Contusion of right front wall of thorax, initial encounter: Secondary | ICD-10-CM | POA: Diagnosis not present

## 2017-05-07 DIAGNOSIS — J45909 Unspecified asthma, uncomplicated: Secondary | ICD-10-CM | POA: Diagnosis not present

## 2017-05-07 DIAGNOSIS — S0083XA Contusion of other part of head, initial encounter: Secondary | ICD-10-CM | POA: Diagnosis not present

## 2017-05-07 DIAGNOSIS — S20219A Contusion of unspecified front wall of thorax, initial encounter: Secondary | ICD-10-CM

## 2017-05-07 DIAGNOSIS — S299XXA Unspecified injury of thorax, initial encounter: Secondary | ICD-10-CM | POA: Diagnosis not present

## 2017-05-07 DIAGNOSIS — Z7982 Long term (current) use of aspirin: Secondary | ICD-10-CM | POA: Insufficient documentation

## 2017-05-07 DIAGNOSIS — Y999 Unspecified external cause status: Secondary | ICD-10-CM | POA: Insufficient documentation

## 2017-05-07 DIAGNOSIS — S0990XA Unspecified injury of head, initial encounter: Secondary | ICD-10-CM | POA: Diagnosis not present

## 2017-05-07 DIAGNOSIS — Z79899 Other long term (current) drug therapy: Secondary | ICD-10-CM | POA: Diagnosis not present

## 2017-05-07 DIAGNOSIS — Z791 Long term (current) use of non-steroidal anti-inflammatories (NSAID): Secondary | ICD-10-CM | POA: Diagnosis not present

## 2017-05-07 DIAGNOSIS — Y939 Activity, unspecified: Secondary | ICD-10-CM | POA: Diagnosis not present

## 2017-05-07 DIAGNOSIS — S62301B Unspecified fracture of second metacarpal bone, left hand, initial encounter for open fracture: Secondary | ICD-10-CM | POA: Diagnosis not present

## 2017-05-07 DIAGNOSIS — I10 Essential (primary) hypertension: Secondary | ICD-10-CM | POA: Insufficient documentation

## 2017-05-07 MED ORDER — ACETAMINOPHEN 500 MG PO TABS
1000.0000 mg | ORAL_TABLET | Freq: Once | ORAL | Status: AC
  Administered 2017-05-07: 1000 mg via ORAL
  Filled 2017-05-07: qty 2

## 2017-05-07 NOTE — ED Triage Notes (Signed)
Pt reports she tripped pta and fell face forward hitting left side of her face and her sternum. Pt denies LOC.

## 2017-05-07 NOTE — ED Provider Notes (Signed)
[facility] Emergency Department Provider Note  ____________________________________________  Time seen: Approximately 5:21 PM  I have reviewed the triage vital signs and the nursing notes.   HISTORY  Chief Complaint Fall   HPI Heidi [name] is a 83 y.o. female with the history of osteoarthritis and osteoporosis who presents for evaluation of chest pain and face pain status post mechanical fall. History is gathered from patient's daughter who is at the bedside. She was with the patient when she fell. Patient was trying to turn around and she lost her balance and fell on top of a chair. She hit her chest on the chair at the site of her lifeline button. She also hit her face on the chair. No LOC. No blood thinners. Patient is complaining of pain in her chest that has been constant, moderate, and worse with movement or breathing. The pain has been present since the fall. No cough, congestion, fever, chill., SOB. She also has a bruise over the L wrist.  Past Medical History:  Diagnosis Date  . Anemia   . Asthma   . GERD (gastroesophageal reflux disease)   . Hypertension   . Inflammatory arthritis    elevated ESR, negative temporal artery bx, low titer rheumatoid factor  . Nephrolithiasis   . OA (osteoarthritis)    Left knee replacement, hands, shoulders, cervial spine  . Osteoporosis    vitamin D deficiency, nasal miacalcin, reclast, previous rib fracture  . Pancreatitis    s/p cholecystectomy with ERCP and stone extraction  . Vitamin D deficiency     Patient Active Problem List   Diagnosis Date Noted  . Weakness 11/19/2016  . Visit for suture removal 07/07/2016  . Skin lesion of cheek 04/10/2015  . Gas 04/10/2015  . Loss of weight 04/08/2015  . Rib pain on right side 08/16/2014  . Shoulder pain 08/16/2014  . Lower extremity edema 01/20/2014  . Leg pain 01/20/2014  . Rash 09/12/2013  . Urinary frequency 09/12/2013  . Headache 02/19/2013    . Dizziness 02/19/2013  . Inflammatory arthritis 08/16/2012  . Osteoarthritis 08/16/2012  . Osteoporosis 08/16/2012  . Anemia 08/16/2012  . GERD (gastroesophageal reflux disease) 08/16/2012  . Hypertension 08/16/2012    Past Surgical History:  Procedure Laterality Date  . ABDOMINAL HYSTERECTOMY     secondary  to bleeding  . LITHOTRIPSY    . REPLACEMENT TOTAL KNEE     Left knee replacement    Prior to Admission medications   Medication Sig Start Date End Date Taking? Authorizing Provider  amLODipine (NORVASC) 5 MG tablet Take 1 tablet (5 mg total) by mouth daily. 03/21/17  Yes Einar Pheasant, MD  aspirin EC 81 MG tablet Take 81 mg by mouth daily.   Yes [provider]  B Complex-C (B-COMPLEX WITH VITAMIN C) tablet Take 1 tablet by mouth daily.   Yes [provider]  Calcium Carbonate-Vitamin D (CALCIUM 600+D) 600-200 MG-UNIT TABS Take 1 tablet by mouth 2 (two) times daily.   Yes [provider]  Cholecalciferol (VITAMIN D) 2000 UNITS tablet Take 2,000 Units by mouth daily.   Yes [provider]  metoprolol succinate (TOPROL-XL) 25 MG 24 hr tablet TAKE ONE HALF TABLET DAILY 03/21/17  Yes Einar Pheasant, MD  montelukast (SINGULAIR) 10 MG tablet Take 1 tablet (10 mg total) by mouth daily. 03/21/17  Yes Einar Pheasant, MD  vitamin C (ASCORBIC ACID) 500 MG tablet Take 500 mg by mouth daily.   Yes [provider]  vitamin E 400 UNIT capsule Take 400 Units by mouth daily.   Yes [provider]  acetaminophen (TYLENOL) 650 MG CR tablet Take 1,300 mg by mouth 2 (two) times daily.     [provider]  diclofenac sodium (VOLTAREN) 1 % GEL APPLY 4 GRAMS TOPICALLY TWO TIMES DAILY 03/21/17   Einar Pheasant, MD    Allergies Anhydrous base; Aspirin; Carbapenems; Cephalosporins; Levaquin [levofloxacin in d5w]; Penicillins; Sulfa antibiotics; Sulfonylureas; Thiazide-type diuretics; Tramadol; Macrobid [nitrofurantoin monohyd macro]; and  Omnicef [cefdinir]  Family History  Problem Relation Age of Onset  . Hypertension Mother   . Stroke Mother   . Stomach cancer Father   . Arthritis Brother   . Lung cancer Brother     Social History Social History  Substance Use Topics  . Smoking status: Never Smoker  . Smokeless tobacco: Never Used  . Alcohol use No    Review of Systems Constitutional: Negative for fever. Eyes: Negative for visual changes. ENT: + L cheek bruise. No neck injury Cardiovascular: + chest wall pain Respiratory: Negative for shortness of breath.  Gastrointestinal: Negative for abdominal pain or injury. Genitourinary: Negative for dysuria. Musculoskeletal: Negative for back injury, negative for arm or leg pain. + L wrist bruise Skin: Negative for laceration/abrasions. Neurological: negative for head injury.   ____________________________________________   PHYSICAL EXAM:  VITAL SIGNS: ED Triage Vitals [05/07/17 1324]  Enc Vitals Group     BP 136/64     Pulse Rate 88     Resp 18     Temp 98.5 F (36.9 C)     Temp Source Oral     SpO2 97 %     Weight 102 lb (46.3 kg)     Height      Head Circumference      Peak Flow      Pain Score      Pain Loc      Pain Edu?      Excl. in [place]?     Constitutional: Alert and oriented x2. No acute distress. Does not appear intoxicated. HEENT Head: Normocephalic and atraumatic. Face: No facial bony tenderness. Stable midface, small abrasion to the L cheek Ears: No hemotympanum bilaterally. No Battle sign Eyes: No eye injury. PERRL. No raccoon eyes Nose: Nontender. No epistaxis. No rhinorrhea Mouth/Throat: Mucous membranes are moist. No oropharyngeal blood. No dental injury. Airway patent without stridor. Normal voice. Neck: no C-collar in place. No midline c-spine tenderness.  Cardiovascular: Normal rate, regular rhythm. Normal and symmetric distal pulses are present in all extremities. Pulmonary/Chest: Chest wall is stable, ttp over the mid  anterior aspect with slight redness but no deformities. Normal respiratory effort. Breath sounds are normal. No crepitus.  Abdominal: Soft, nontender, non distended. Musculoskeletal: Nontender with normal full range of motion in all extremities. No deformities. No thoracic or lumbar midline spinal tenderness. Pelvis is stable.ruising located over the left wrist with full painless range of motion and no tenderness Skin: Skin is warm, dry and intact. No abrasions or contutions. Psychiatric: Speech and behavior are appropriate. Neurological: Normal speech and language. Moves all extremities to command. No gross focal neurologic deficits are appreciated.  Glascow Coma Score: 4 - Opens eyes on own 6 - Follows simple motor commands 5 - Alert and oriented GCS: 15   ____________________________________________   LABS (all labs ordered are listed, but only abnormal results are displayed)  Labs Reviewed - No data to display ____________________________________________  EKG  none  ____________________________________________  RADIOLOGY  CXR:  Negative  CT head: negative  XR wrist: negative  ____________________________________________   PROCEDURES  Procedure(s) performed: None Procedures Critical Care performed:  None ____________________________________________   INITIAL IMPRESSION / ASSESSMENT AND PLAN / ED COURSE  83 y.o. female with the history of osteoarthritis and osteoporosis who presents for evaluation of chest wall pain and face pain status post mechanical fall. Patient is neurologically intact, normal vital signs, GCS of 15, no signs or symptoms of basilar skull fracture. No lacerations or abrasions. She has small bruising to the left cheek with no tenderness over the facial bones. Head CT is negative for intracranial injury. She also has tenderness to palpation the anterior chest wall with some redness exactly where her lifeline button rests on her chest.I believe that  the redness was due to her falling on top of the lifeline button on the chair. Her lungs are clear and chest x-ray is negative for fracture. We'll start her on Tylenol. X-ray for her wrist is pending. Plan to discharge home.     Pertinent labs & imaging results that were available during my care of the patient were reviewed by me and considered in my medical decision making (see chart for details).    ____________________________________________   FINAL CLINICAL IMPRESSION(S) / ED DIAGNOSES  Final diagnoses:  Fall, initial encounter  Contusion of face, initial encounter  Contusion of chest wall, unspecified laterality, initial encounter  Contusion of left wrist, initial encounter      NEW MEDICATIONS STARTED DURING THIS VISIT:  Discharge Medication List as of 05/07/2017  6:09 PM       Note:  This document was prepared using Dragon voice recognition software and may include unintentional dictation errors.    Rudene Re, MD 05/07/17 [phone]

## 2017-05-07 NOTE — Discharge Instructions (Signed)
You were seen in the emergency department after a fall. Luckily all of your imaging studies did not show any evidence of injuries. Follow-up with you doctor within the next 2-3 days for further evaluation. Sometimes injuries can present at a later time and therefore it is imperative that you return to the emergency room if you have a severe headache, facial droop, neck pain, numbness or weakness of your extremities, slurred speech, difficulty finding words, chest pain, back pain, abdominal pain, or any other new symptoms that were not present during this visit. You may take Tylenol 1000mg  every 8 hours at home for your pain.

## 2017-06-11 DIAGNOSIS — M25511 Pain in right shoulder: Secondary | ICD-10-CM | POA: Diagnosis not present

## 2017-06-11 DIAGNOSIS — G8929 Other chronic pain: Secondary | ICD-10-CM | POA: Diagnosis not present

## 2017-06-11 DIAGNOSIS — M542 Cervicalgia: Secondary | ICD-10-CM | POA: Diagnosis not present

## 2017-06-11 DIAGNOSIS — M150 Primary generalized (osteo)arthritis: Secondary | ICD-10-CM | POA: Diagnosis not present

## 2017-07-03 DIAGNOSIS — L84 Corns and callosities: Secondary | ICD-10-CM | POA: Diagnosis not present

## 2017-07-03 DIAGNOSIS — M79674 Pain in right toe(s): Secondary | ICD-10-CM | POA: Diagnosis not present

## 2017-07-03 DIAGNOSIS — B351 Tinea unguium: Secondary | ICD-10-CM | POA: Diagnosis not present

## 2017-07-24 ENCOUNTER — Ambulatory Visit (INDEPENDENT_AMBULATORY_CARE_PROVIDER_SITE_OTHER): Payer: Medicare Other | Admitting: Internal Medicine

## 2017-07-24 ENCOUNTER — Encounter: Payer: Self-pay | Admitting: Internal Medicine

## 2017-07-24 VITALS — BP 130/70 | HR 81 | Temp 98.1°F | Wt 98.2 lb

## 2017-07-24 DIAGNOSIS — K146 Glossodynia: Secondary | ICD-10-CM

## 2017-07-24 DIAGNOSIS — M25519 Pain in unspecified shoulder: Secondary | ICD-10-CM | POA: Diagnosis not present

## 2017-07-24 DIAGNOSIS — Z23 Encounter for immunization: Secondary | ICD-10-CM

## 2017-07-24 DIAGNOSIS — M1990 Unspecified osteoarthritis, unspecified site: Secondary | ICD-10-CM

## 2017-07-24 DIAGNOSIS — I10 Essential (primary) hypertension: Secondary | ICD-10-CM

## 2017-07-24 DIAGNOSIS — K219 Gastro-esophageal reflux disease without esophagitis: Secondary | ICD-10-CM

## 2017-07-24 DIAGNOSIS — R634 Abnormal weight loss: Secondary | ICD-10-CM

## 2017-07-24 MED ORDER — FIRST-DUKES MOUTHWASH MT SUSP: OROMUCOSAL | 0 refills | Status: DC

## 2017-07-24 NOTE — Progress Notes (Signed)
Patient ID: Heidi [name], female   DOB: Mar 21, 1918, 83 y.o.   MRN: [id number]   Subjective:    Patient ID: Heidi [name], female    DOB: Jan 09, 1918, 83 y.o.   MRN: [id number]  HPI  Patient here for a scheduled follow up.  She is accompanied by her daughter.  History obtained from both of them.  Overall things are stable.  She is still having shoulder pain.  Just was Dr Jefm Bryant 06/11/17.  S/p injection.  No chest pain.  Eating, but daughter reports does not eat a lot.  No nausea or vomiting.  Bowels moving.  Does report a sensation change across the tip of her tongue.  Has been present for a while.  No lesions.  States is intermittent.  Worse in the am.     Past Medical History:  Diagnosis Date  . Anemia   . Asthma   . GERD (gastroesophageal reflux disease)   . Hypertension   . Inflammatory arthritis    elevated ESR, negative temporal artery bx, low titer rheumatoid factor  . Nephrolithiasis   . OA (osteoarthritis)    Left knee replacement, hands, shoulders, cervial spine  . Osteoporosis    vitamin D deficiency, nasal miacalcin, reclast, previous rib fracture  . Pancreatitis    s/p cholecystectomy with ERCP and stone extraction  . Vitamin D deficiency    Past Surgical History:  Procedure Laterality Date  . ABDOMINAL HYSTERECTOMY     secondary  to bleeding  . LITHOTRIPSY    . REPLACEMENT TOTAL KNEE     Left knee replacement   Family History  Problem Relation Age of Onset  . Hypertension Mother   . Stroke Mother   . Stomach cancer Father   . Arthritis Brother   . Lung cancer Brother    Social History   Socioeconomic History  . Marital status: Unknown    Spouse name: None  . Number of children: 5  . Years of education: None  . Highest education level: None  Social Needs  . Financial resource strain: None  . Food insecurity - worry: None  . Food insecurity - inability: None  . Transportation needs - medical: None  . Transportation needs - non-medical:  None  Occupational History  . None  Tobacco Use  . Smoking status: Never Smoker  . Smokeless tobacco: Never Used  Substance and Sexual Activity  . Alcohol use: No    Alcohol/week: 0.0 oz  . Drug use: No  . Sexual activity: None  Other Topics Concern  . None  Social History Narrative   She is widowed. She has five children, three daughter and two son.    Outpatient Encounter Medications as of 07/24/2017  Medication Sig  . acetaminophen (TYLENOL) 650 MG CR tablet Take 1,300 mg by mouth 2 (two) times daily.   Marland Kitchen amLODipine (NORVASC) 5 MG tablet Take 1 tablet (5 mg total) by mouth daily.  Marland Kitchen aspirin EC 81 MG tablet Take 81 mg by mouth daily.  . B Complex-C (B-COMPLEX WITH VITAMIN C) tablet Take 1 tablet by mouth daily.  . Calcium Carbonate-Vitamin D (CALCIUM 600+D) 600-200 MG-UNIT TABS Take 1 tablet by mouth 2 (two) times daily.  . Cholecalciferol (VITAMIN D) 2000 UNITS tablet Take 2,000 Units by mouth daily.  . diclofenac sodium (VOLTAREN) 1 % GEL APPLY 4 GRAMS TOPICALLY TWO TIMES DAILY  . metoprolol succinate (TOPROL-XL) 25 MG 24 hr tablet TAKE ONE HALF TABLET DAILY  . montelukast (SINGULAIR) 10 MG  tablet Take 1 tablet (10 mg total) by mouth daily.  . vitamin C (ASCORBIC ACID) 500 MG tablet Take 500 mg by mouth daily.  . vitamin E 400 UNIT capsule Take 400 Units by mouth daily.  . Diphenhyd-Hydrocort-Nystatin (FIRST-DUKES MOUTHWASH) SUSP 5cc's swish and spit tid prn   Facility-Administered Encounter Medications as of 07/24/2017  Medication  . pneumococcal 13-valent conjugate vaccine (PREVNAR 13) injection 0.5 mL    Review of Systems  Constitutional:       Does not eat much - per daughter.  Some weight loss.   HENT: Negative for congestion and sinus pressure.   Respiratory: Negative for cough, chest tightness and shortness of breath.   Cardiovascular: Negative for chest pain, palpitations and leg swelling.  Gastrointestinal: Negative for abdominal pain, diarrhea, nausea and  vomiting.  Genitourinary: Negative for difficulty urinating and dysuria.  Musculoskeletal:       Shoulder pain as outlined.    Skin: Negative for color change and rash.  Neurological: Negative for dizziness, light-headedness and headaches.  Psychiatric/Behavioral: Negative for agitation and dysphoric mood.       Objective:     Blood pressure rechecked by me:  124/68  Physical Exam  Constitutional: She appears well-developed and well-nourished. No distress.  HENT:  Nose: Nose normal.  Mouth/Throat: Oropharynx is clear and moist.  No lesions noted on tongue.    Neck: Neck supple. No thyromegaly present.  Cardiovascular: Normal rate and regular rhythm.  Pulmonary/Chest: Breath sounds normal. No respiratory distress. She has no wheezes.  Abdominal: Soft. Bowel sounds are normal. There is no tenderness.  Musculoskeletal: She exhibits no edema or tenderness.  Lymphadenopathy:    She has no cervical adenopathy.  Skin: No rash noted. No erythema.  Psychiatric: She has a normal mood and affect. Her behavior is normal.    BP 130/70 (BP Location: Left Arm, Patient Position: Sitting, Cuff Size: Normal)   Pulse 81   Temp 98.1 F (36.7 C) (Oral)   Wt 98 lb 3.2 oz (44.5 kg)   SpO2 95%   BMI 19.18 kg/m  Wt Readings from Last 3 Encounters:  07/24/17 98 lb 3.2 oz (44.5 kg)  05/07/17 102 lb (46.3 kg)  03/20/17 102 lb 9.6 oz (46.5 kg)     Lab Results  Component Value Date   WBC 6.2 01/30/2017   HGB 11.2 (L) 01/30/2017   HCT 33.8 (L) 01/30/2017   PLT 203 01/30/2017   GLUCOSE 96 01/30/2017   ALT 13 04/05/2016   AST 18 04/05/2016   NA 139 01/30/2017   K 3.8 01/30/2017   CL 104 01/30/2017   CREATININE 0.86 01/30/2017   BUN 26 (H) 01/30/2017   CO2 29 01/30/2017   TSH 2.39 04/05/2016   INR 0.9 08/30/2013    Dg Chest 2 View  Result Date: 05/07/2017 CLINICAL DATA:  Fall. EXAM: CHEST  2 VIEW COMPARISON:  Chest x-ray dated Feb 01, 2016. FINDINGS: The cardiomediastinal silhouette  is normal in size. Normal pulmonary vascularity. Atherosclerotic calcification of the aortic arch. No focal consolidation, pleural effusion, or pneumothorax. Chronic bilateral rib fractures are again noted. Unchanged accentuated thoracic kyphosis with mild wedging of multiple midthoracic vertebral bodies. Grade 1 anterolisthesis of L5 on S1. Severe degenerative changes of the bilateral shoulders. Prior cholecystectomy. IMPRESSION: 1. No active cardiopulmonary disease. 2. No definite acute fracture. Electronically Signed   By: Titus Dubin M.D.   On: 05/07/2017 14:39   Dg Wrist Complete Left  Result Date: 05/07/2017 CLINICAL DATA:  Left  wrist pain and bruising after fall. EXAM: LEFT WRIST - COMPLETE 3+ VIEW COMPARISON:  None. FINDINGS: Plate and screw fixation of the fourth and fifth proximal metacarpals. Osteoarthritic joint space narrowing and sclerosis of the first MCP, CMC and triscaphe articulations of the hand and wrist. Minimal degenerative spurring is seen about these joints. There is chondrocalcinosis of the triangular fibrocartilage complex and radiocarpal joint which can be seen in calcium pyrophosphate deposition disease (pseudogout). Accessory ossicle is suggested off the tip of the ulnar styloid versus old ununited ulnar styloid fracture. No acute fracture dislocation about the wrist and distal forearm. IMPRESSION: 1. Osteoarthritis of the MCP and CMC articulations of the thumb. 2. Osteoarthritis of the triscaphe joint of the wrist. 3. Chondrocalcinosis the triangular fibrocartilage complex and radiocarpal joint. 4. No acute fracture nor dislocations. 5. ORIF of the fourth and fifth metacarpals.  No hardware failure. Electronically Signed   By: Ashley Royalty M.D.   On: 05/07/2017 18:02   Ct Head Wo Contrast  Result Date: 05/07/2017 CLINICAL DATA:  Fall with left facial injury. EXAM: CT HEAD WITHOUT CONTRAST TECHNIQUE: Contiguous axial images were obtained from the base of the skull through the  vertex without intravenous contrast. COMPARISON:  01/30/2017 head CT. FINDINGS: Brain: No evidence of parenchymal hemorrhage or extra-axial fluid collection. No mass lesion, mass effect, or midline shift. No CT evidence of acute infarction. Nonspecific moderate subcortical and periventricular white matter hypodensity, most in keeping with chronic small vessel ischemic change. Cerebral volume is age appropriate. No ventriculomegaly. Vascular: No acute abnormality. Skull: No evidence of calvarial fracture. Sinuses/Orbits: No fluid levels. Mild mucoperiosteal thickening in the bilateral ethmoidal air cells and left sphenoid sinus. Other:  The mastoid air cells are unopacified. IMPRESSION: 1. No evidence of acute intracranial abnormality. No evidence of calvarial fracture. 2. Moderate chronic small vessel ischemic change. 3. Mild chronic appearing paranasal sinusitis. Electronically Signed   By: Ilona Sorrel M.D.   On: 05/07/2017 14:08       Assessment & Plan:   Problem List Items Addressed This Visit    GERD (gastroesophageal reflux disease)    Controlled on current regimen.        Hypertension    Blood pressure under good control.  Continue same medication regimen.  Follow pressures.  Follow metabolic panel.        Inflammatory arthritis    Followed by Dr Jefm Bryant.        Loss of weight    Weight down several pounds.  Discussed increased po intake.  Is drinking ensures.  Eating.  Follow.        Shoulder pain    Persistent.  Followed by Dr Jefm Bryant.  Just evaluated.  S/p injection.         Other Visit Diagnoses    Tongue pain    -  Primary   Sensation change as outlined.  No abnormality noted on exam. Dukes Mouthwash.  Follow.     Encounter for immunization       Relevant Orders   Flu vaccine HIGH DOSE PF (Completed)       Einar Pheasant, MD

## 2017-07-27 ENCOUNTER — Encounter: Payer: Self-pay | Admitting: Internal Medicine

## 2017-07-27 NOTE — Assessment & Plan Note (Signed)
Followed by Dr Kernodle.   

## 2017-07-27 NOTE — Assessment & Plan Note (Signed)
Controlled on current regimen.   

## 2017-07-27 NOTE — Assessment & Plan Note (Signed)
Persistent.  Followed by Dr Jefm Bryant.  Just evaluated.  S/p injection.

## 2017-07-27 NOTE — Assessment & Plan Note (Signed)
Weight down several pounds.  Discussed increased po intake.  Is drinking ensures.  Eating.  Follow.

## 2017-07-27 NOTE — Assessment & Plan Note (Signed)
Blood pressure under good control.  Continue same medication regimen.  Follow pressures.  Follow metabolic panel.   

## 2017-08-31 ENCOUNTER — Ambulatory Visit: Payer: Medicare Other

## 2017-09-14 ENCOUNTER — Telehealth: Payer: Self-pay | Admitting: Internal Medicine

## 2017-09-14 ENCOUNTER — Ambulatory Visit (INDEPENDENT_AMBULATORY_CARE_PROVIDER_SITE_OTHER): Payer: Medicare Other

## 2017-09-14 VITALS — BP 124/76 | HR 66 | Temp 98.2°F | Resp 14 | Ht 59.0 in | Wt 98.1 lb

## 2017-09-14 DIAGNOSIS — Z0000 Encounter for general adult medical examination without abnormal findings: Secondary | ICD-10-CM

## 2017-09-14 NOTE — Telephone Encounter (Signed)
-----   Message from Dia Crawford, LPN sent at 03/18/9380 11:20 AM EST ----- Pt is here for AWV. She and her daughter drove from [place].  Daughter states she woke this morning with what appears to be the beginnings of a skin tear on the spine and a red spot directly above. There was no visible evidence of redness at the site yesterday.  She has neosporin and a bandaid on.  It looks OK to me but I can have Juliann Pulse lay eyes on it too for confirmation, I know you are full. All schedules appear full  Let me know if you want to do anything different.    Juliann Pulse has viewed it and agrees, the start of a potential skin tear.  Verbal educational information provided.

## 2017-09-14 NOTE — Patient Instructions (Addendum)
  Heidi [name] , Thank you for taking time to come for your Medicare Wellness Visit. I appreciate your ongoing commitment to your health goals. Please review the following plan we discussed and let me know if I can assist you in the future.   Follow up with Dr. Nicki Reaper as needed.    Bring a copy of your [place] and/or Living Will to be scanned into chart.  Have a great day!  These are the goals we discussed: Goals    . DIET - INCREASE WATER INTAKE       This is a list of the screening recommended for you and due dates:  Health Maintenance  Topic Date Due  . Tetanus Vaccine  05/19/1939  . Flu Shot  Completed  . DEXA scan (bone density measurement)  Completed  . Pneumonia vaccines  Completed

## 2017-09-14 NOTE — Telephone Encounter (Signed)
Reviewed note and spoke to [place] who examined pt.  Pt not in office currently.  She had been scooting up in bed and pushing herself up with her heels.  This continues to irritate the lower back/buttock.  No actual skin break down.  Pt was instructed by Lead Nurse to pack the area and avoid pressure.  Change the way she adjusts herself in bed.  Will call if any problems.  Pt and daughter comfortable with this plan per nurse.

## 2017-09-14 NOTE — Progress Notes (Signed)
Subjective:   Heidi [name] is a 84 y.o. female who presents for Medicare Annual (Subsequent) preventive examination.  Review of Systems:  No ROS.  Medicare Wellness Visit. Additional risk factors are reflected in the social history. Cardiac Risk Factors include: advanced age (>5mn, >>55women);hypertension     Objective:     Vitals: BP 124/76 (BP Location: Left Arm, Patient Position: Sitting, Cuff Size: Normal)   Pulse 66   Temp 98.2 F (36.8 C) (Oral)   Resp 14   Ht '4\' 11"'  (1.499 m)   Wt 98 lb 1.9 oz (44.5 kg)   SpO2 97%   BMI 19.82 kg/m   Body mass index is 19.82 kg/m.  Advanced Directives 09/14/2017 01/30/2017 08/31/2016 02/01/2016 12/15/2015  Does Patient Have a Medical Advance Directive? Yes No Yes Yes Yes  Type of Advance Directive Living will - Living will Living will Healthcare Power of Attorney  Does patient want to make changes to medical advance directive? - - No - Patient declined - -  Would patient like information on creating a medical advance directive? - No - Patient declined - - -    Tobacco Social History   Tobacco Use  Smoking Status Never Smoker  Smokeless Tobacco Never Used     Counseling given: Not Answered   Clinical Intake:  Pre-visit preparation completed: Yes  Pain : No/denies pain     Nutritional Status: BMI of 19-24  Normal Diabetes: No  How often do you need to have someone help you when you read instructions, pamphlets, or other written materials from your doctor or pharmacy?: 2 - Rarely  Interpreter Needed?: No     Past Medical History:  Diagnosis Date  . Anemia   . Asthma   . GERD (gastroesophageal reflux disease)   . Hypertension   . Inflammatory arthritis    elevated ESR, negative temporal artery bx, low titer rheumatoid factor  . Nephrolithiasis   . OA (osteoarthritis)    Left knee replacement, hands, shoulders, cervial spine  . Osteoporosis    vitamin D deficiency, nasal miacalcin, reclast, previous rib  fracture  . Pancreatitis    s/p cholecystectomy with ERCP and stone extraction  . Vitamin D deficiency    Past Surgical History:  Procedure Laterality Date  . ABDOMINAL HYSTERECTOMY     secondary  to bleeding  . LITHOTRIPSY    . REPLACEMENT TOTAL KNEE     Left knee replacement   Family History  Problem Relation Age of Onset  . Hypertension Mother   . Stroke Mother   . Stomach cancer Father   . Arthritis Brother   . Lung cancer Brother    Social History   Socioeconomic History  . Marital status: Unknown    Spouse name: None  . Number of children: 5  . Years of education: None  . Highest education level: None  Social Needs  . Financial resource strain: None  . Food insecurity - worry: None  . Food insecurity - inability: None  . Transportation needs - medical: None  . Transportation needs - non-medical: None  Occupational History  . None  Tobacco Use  . Smoking status: Never Smoker  . Smokeless tobacco: Never Used  Substance and Sexual Activity  . Alcohol use: No    Alcohol/week: 0.0 oz  . Drug use: No  . Sexual activity: None  Other Topics Concern  . None  Social History Narrative   She is widowed. She has five children, three daughter and  two son.    Outpatient Encounter Medications as of 09/14/2017  Medication Sig  . acetaminophen (TYLENOL) 650 MG CR tablet Take 1,300 mg by mouth 2 (two) times daily.   Marland Kitchen amLODipine (NORVASC) 5 MG tablet Take 1 tablet (5 mg total) by mouth daily.  Marland Kitchen aspirin EC 81 MG tablet Take 81 mg by mouth daily.  . B Complex-C (B-COMPLEX WITH VITAMIN C) tablet Take 1 tablet by mouth daily.  . Calcium Carbonate-Vitamin D (CALCIUM 600+D) 600-200 MG-UNIT TABS Take 1 tablet by mouth 2 (two) times daily.  . Cholecalciferol (VITAMIN D) 2000 UNITS tablet Take 2,000 Units by mouth daily.  . diclofenac sodium (VOLTAREN) 1 % GEL APPLY 4 GRAMS TOPICALLY TWO TIMES DAILY  . Diphenhyd-Hydrocort-Nystatin (FIRST-DUKES MOUTHWASH) SUSP 5cc's swish and spit  tid prn  . metoprolol succinate (TOPROL-XL) 25 MG 24 hr tablet TAKE ONE HALF TABLET DAILY  . montelukast (SINGULAIR) 10 MG tablet Take 1 tablet (10 mg total) by mouth daily.  . vitamin C (ASCORBIC ACID) 500 MG tablet Take 500 mg by mouth daily.  . vitamin E 400 UNIT capsule Take 400 Units by mouth daily.   Facility-Administered Encounter Medications as of 09/14/2017  Medication  . pneumococcal 13-valent conjugate vaccine (PREVNAR 13) injection 0.5 mL    Activities of Daily Living In your present state of health, do you have any difficulty performing the following activities: 09/14/2017  Hearing? Y  Comment Hearing aids  Vision? N  Difficulty concentrating or making decisions? Y  Walking or climbing stairs? Y  Comment unsteady gait  Dressing or bathing? Y  Comment daughter assists  Doing errands, shopping? Y  Comment she does not Physiological scientist and eating ? Y  Comment daughter preps food; self feeds  Using the Toilet? N  In the past six months, have you accidently leaked urine? Y  Comment [place] with a daily brief  Do you have problems with loss of bowel control? N  Managing your Medications? Y  Comment daughter manages  Managing your Finances? Y  Comment daughter assists  Housekeeping or managing your Housekeeping? Y  Comment daughter assists  Some recent data might be hidden    Patient Care Team: Einar Pheasant, MD as PCP - General (Internal Medicine)    Assessment:   This is a routine wellness examination for Heidi [name]. The goal of the wellness visit is to assist the patient how to close the gaps in care and create a preventative care plan for the patient.   The roster of all physicians providing medical care to patient is listed in the Snapshot section of the chart.  Taking calcium VIT D as appropriate/Osteoporosis reviewed.    Safety issues reviewed; Smoke and carbon monoxide detectors in the home. No firearms in the home.  Wears seatbelts when riding with  others. Patient does wear sunscreen or protective clothing when in direct sunlight. No violence in the home.  Depression- PHQ 2 &9 complete.  No signs/symptoms or verbal communication regarding little pleasure in doing things, feeling down, depressed or hopeless. No changes in sleeping, energy, eating, concentrating.  No thoughts of self harm or harm towards others.  Time spent on this topic is 9 minutes.   Patient is alert, normal appearance, oriented to person/place/and time. Recall of 1/3 words, and counted from 20 to 1. Displays appropriate judgement and can read correct time from watch face.   No new identified risk were noted.  Daughter assists with ADL's.  Ambulates with walker as needed.  BMI- discussed the importance of a healthy diet, water intake and the benefits of aerobic exercise. Educational material provided. Encouraged chair exercises.  24 hour diet recall: Breakfast: cereal, fruit Snack: cracker, ensure Lunch: sandwich, chips, cookie Snack: cookie, ensure Dinner: 2 vegetables, meatloaf Snack:ice cream  Daily fluid intake: 0 cups of caffeine, 1 cups of water, 2 cups of tea  Dental- partial upper/lower.  Eye- Visual acuity not assessed per patient preference since they have regular follow up with the ophthalmologist.  Wears corrective lenses.  Sleep patterns- Sleeps 6-7 hours at night.  Wakes feeling rested.  Patient Concerns: None at this time. Follow up with PCP as needed.  Exercise Activities and Dietary recommendations Current Exercise Habits: The patient does not participate in regular exercise at present  Goals    . DIET - INCREASE WATER INTAKE       Fall Risk Fall Risk  09/14/2017 08/31/2016 08/09/2016 07/05/2016 05/10/2016  Falls in the past year? No Yes No Yes No  Comment - - - - Emmi Telephone Survey: data to providers prior to load  Number falls in past yr: - 2 or more - 2 or more -  Injury with Fall? - Yes - Yes -  Risk Factor Category  - High  Fall Risk - High Fall Risk -  Risk for fall due to : - History of fall(s) - History of fall(s) -  Follow up - Falls prevention discussed;Education provided - - -   Depression Screen PHQ 2/9 Scores 09/14/2017 08/31/2016 08/09/2016 07/05/2016  PHQ - 2 Score 0 0 0 0  PHQ- 9 Score 0 - - -     Cognitive Function     6CIT Screen 09/14/2017 08/31/2016  What Year? 4 points 4 points  What month? 3 points 3 points  What time? 0 points 0 points  Count back from 20 0 points 0 points  Months in reverse 0 points 0 points    Immunization History  Administered Date(s) Administered  . Influenza Split 05/26/2014  . Influenza, High Dose Seasonal PF 07/05/2016, 07/24/2017  . Influenza,inj,Quad PF,6+ Mos 06/10/2015  . Influenza-Unspecified 06/20/2012, 06/21/2013, 04/11/2014  . Pneumococcal Conjugate-13 11/26/2015  . Pneumococcal Polysaccharide-23 07/10/2007    Screening Tests Health Maintenance  Topic Date Due  . TETANUS/TDAP  05[birth year]19[birth year]1939  . INFLUENZA VACCINE  Completed  . DEXA SCAN  Completed  . PNA vac Low Risk Adult  Completed      Plan:    End of life planning; Advance aging; Advanced directives discussed. Copy of current HCPOA/Living Will requested.    I have personally reviewed and noted the following in the patient's chart:   . Medical and social history . Use of alcohol, tobacco or illicit drugs  . Current medications and supplements . Functional ability and status . Nutritional status . Physical activity . Advanced directives . List of other physicians . Hospitalizations, surgeries, and ER visits in previous 12 months . Vitals . Screenings to include cognitive, depression, and falls . Referrals and appointments  In addition, I have reviewed and discussed with patient certain preventive protocols, quality metrics, and best practice recommendations. A written personalized care plan for preventive services as well as general preventive health recommendations were provided to  patient.     Varney Biles, LPN  02/15/8937   Reviewed above information.  Agree with assessment and plan.    Dr Nicki Reaper

## 2017-10-05 DIAGNOSIS — S20211A Contusion of right front wall of thorax, initial encounter: Secondary | ICD-10-CM | POA: Diagnosis not present

## 2017-10-05 DIAGNOSIS — W01190A Fall on same level from slipping, tripping and stumbling with subsequent striking against furniture, initial encounter: Secondary | ICD-10-CM | POA: Diagnosis not present

## 2017-10-05 DIAGNOSIS — M545 Low back pain: Secondary | ICD-10-CM | POA: Diagnosis not present

## 2017-10-05 DIAGNOSIS — S2241XA Multiple fractures of ribs, right side, initial encounter for closed fracture: Secondary | ICD-10-CM | POA: Diagnosis not present

## 2017-11-21 ENCOUNTER — Encounter: Payer: Self-pay | Admitting: Internal Medicine

## 2017-11-21 ENCOUNTER — Ambulatory Visit (INDEPENDENT_AMBULATORY_CARE_PROVIDER_SITE_OTHER): Payer: Medicare Other | Admitting: Internal Medicine

## 2017-11-21 VITALS — BP 132/64 | HR 71 | Temp 97.5°F | Resp 20 | Wt 102.0 lb

## 2017-11-21 DIAGNOSIS — I10 Essential (primary) hypertension: Secondary | ICD-10-CM

## 2017-11-21 DIAGNOSIS — M810 Age-related osteoporosis without current pathological fracture: Secondary | ICD-10-CM | POA: Diagnosis not present

## 2017-11-21 DIAGNOSIS — M1990 Unspecified osteoarthritis, unspecified site: Secondary | ICD-10-CM | POA: Diagnosis not present

## 2017-11-21 DIAGNOSIS — R8290 Unspecified abnormal findings in urine: Secondary | ICD-10-CM | POA: Diagnosis not present

## 2017-11-21 DIAGNOSIS — D649 Anemia, unspecified: Secondary | ICD-10-CM

## 2017-11-21 LAB — CBC WITH DIFFERENTIAL/PLATELET
BASOS ABS: 0 10*3/uL (ref 0.0–0.1)
Basophils Relative: 0.3 % (ref 0.0–3.0)
EOS PCT: 0.2 % (ref 0.0–5.0)
Eosinophils Absolute: 0 10*3/uL (ref 0.0–0.7)
HEMATOCRIT: 33.5 % — AB (ref 36.0–46.0)
HEMOGLOBIN: 11.2 g/dL — AB (ref 12.0–15.0)
LYMPHS PCT: 18.7 % (ref 12.0–46.0)
Lymphs Abs: 1.3 10*3/uL (ref 0.7–4.0)
MCHC: 33.4 g/dL (ref 30.0–36.0)
MCV: 97.7 fl (ref 78.0–100.0)
MONOS PCT: 9.3 % (ref 3.0–12.0)
Monocytes Absolute: 0.7 10*3/uL (ref 0.1–1.0)
Neutro Abs: 5 10*3/uL (ref 1.4–7.7)
Neutrophils Relative %: 71.5 % (ref 43.0–77.0)
Platelets: 198 10*3/uL (ref 150.0–400.0)
RBC: 3.43 Mil/uL — AB (ref 3.87–5.11)
RDW: 12.1 % (ref 11.5–15.5)
WBC: 7.1 10*3/uL (ref 4.0–10.5)

## 2017-11-21 LAB — URINALYSIS, ROUTINE W REFLEX MICROSCOPIC
BILIRUBIN URINE: NEGATIVE
HGB URINE DIPSTICK: NEGATIVE
NITRITE: POSITIVE — AB
Specific Gravity, Urine: 1.025 (ref 1.000–1.030)
Urine Glucose: NEGATIVE
Urobilinogen, UA: 0.2 (ref 0.0–1.0)
pH: 5.5 (ref 5.0–8.0)

## 2017-11-21 LAB — BASIC METABOLIC PANEL
BUN: 20 mg/dL (ref 6–23)
CHLORIDE: 102 meq/L (ref 96–112)
CO2: 30 mEq/L (ref 19–32)
CREATININE: 0.77 mg/dL (ref 0.40–1.20)
Calcium: 10.5 mg/dL (ref 8.4–10.5)
GFR: 73.64 mL/min (ref >60.00)
GLUCOSE: 93 mg/dL (ref 70–99)
POTASSIUM: 4.4 meq/L (ref 3.5–5.1)
Sodium: 140 mEq/L (ref 135–145)

## 2017-11-21 LAB — HEPATIC FUNCTION PANEL
ALBUMIN: 4.3 g/dL (ref 3.5–5.2)
ALK PHOS: 42 U/L (ref 39–117)
ALT: 11 U/L (ref 0–35)
AST: 16 U/L (ref 0–37)
Bilirubin, Direct: 0.1 mg/dL (ref 0.0–0.3)
Total Bilirubin: 0.5 mg/dL (ref 0.2–1.2)
Total Protein: 6.5 g/dL (ref 6.0–8.3)

## 2017-11-21 LAB — TSH: TSH: 1.89 u[IU]/mL (ref 0.35–4.50)

## 2017-11-21 LAB — FERRITIN: Ferritin: 58 ng/mL (ref 10.0–291.0)

## 2017-11-21 LAB — VITAMIN D 25 HYDROXY (VIT D DEFICIENCY, FRACTURES): VITD: 28.5 ng/mL — ABNORMAL LOW (ref 30.00–100.00)

## 2017-11-21 NOTE — Progress Notes (Signed)
Patient ID: Heidi [name], female   DOB: 07-Nov-1917, 84 y.o.   MRN: [id number]   Subjective:    Patient ID: Heidi [name], female    DOB: [birth year], 84 y.o.   MRN: [id number]  HPI  Patient here for a scheduled follow up.  She is accompanied by her daughter.  History obtained from both of them.  Overall things are relatively stable.  She is eating.  Previously noted a neck nodule.  No nodule present on exam.  No chest pain.  Bowels moving.  Bad odor - urine.  Had noticed some swelling - left knee.  Noticed over the last two days.  No injury.  No significant pain.  Desires no further intervention at this time.  Follow.  Overall feels things are stable.     Past Medical History:  Diagnosis Date  . Anemia   . Asthma   . GERD (gastroesophageal reflux disease)   . Hypertension   . Inflammatory arthritis    elevated ESR, negative temporal artery bx, low titer rheumatoid factor  . Nephrolithiasis   . OA (osteoarthritis)    Left knee replacement, hands, shoulders, cervial spine  . Osteoporosis    vitamin D deficiency, nasal miacalcin, reclast, previous rib fracture  . Pancreatitis    s/p cholecystectomy with ERCP and stone extraction  . Vitamin D deficiency    Past Surgical History:  Procedure Laterality Date  . ABDOMINAL HYSTERECTOMY     secondary  to bleeding  . LITHOTRIPSY    . REPLACEMENT TOTAL KNEE     Left knee replacement   Family History  Problem Relation Age of Onset  . Hypertension Mother   . Stroke Mother   . Stomach cancer Father   . Arthritis Brother   . Lung cancer Brother    Social History   Socioeconomic History  . Marital status: Unknown    Spouse name: None  . Number of children: 5  . Years of education: None  . Highest education level: None  Social Needs  . Financial resource strain: None  . Food insecurity - worry: None  . Food insecurity - inability: None  . Transportation needs - medical: None  . Transportation needs - non-medical: None    Occupational History  . None  Tobacco Use  . Smoking status: Never Smoker  . Smokeless tobacco: Never Used  Substance and Sexual Activity  . Alcohol use: No    Alcohol/week: 0.0 oz  . Drug use: No  . Sexual activity: None  Other Topics Concern  . None  Social History Narrative   She is widowed. She has five children, three daughter and two son.    Outpatient Encounter Medications as of 11/21/2017  Medication Sig  . acetaminophen (TYLENOL) 650 MG CR tablet Take 1,300 mg by mouth 2 (two) times daily.   Marland Kitchen amLODipine (NORVASC) 5 MG tablet Take 1 tablet (5 mg total) by mouth daily.  Marland Kitchen aspirin EC 81 MG tablet Take 81 mg by mouth daily.  . B Complex-C (B-COMPLEX WITH VITAMIN C) tablet Take 1 tablet by mouth daily.  . Calcium Carbonate-Vitamin D (CALCIUM 600+D) 600-200 MG-UNIT TABS Take 1 tablet by mouth 2 (two) times daily.  . Cholecalciferol (VITAMIN D) 2000 UNITS tablet Take 2,000 Units by mouth daily.  . diclofenac sodium (VOLTAREN) 1 % GEL APPLY 4 GRAMS TOPICALLY TWO TIMES DAILY  . metoprolol succinate (TOPROL-XL) 25 MG 24 hr tablet TAKE ONE HALF TABLET DAILY  . montelukast (SINGULAIR) 10 MG tablet  Take 1 tablet (10 mg total) by mouth daily.  . vitamin C (ASCORBIC ACID) 500 MG tablet Take 500 mg by mouth daily.  . vitamin E 400 UNIT capsule Take 400 Units by mouth daily.  . [DISCONTINUED] Diphenhyd-Hydrocort-Nystatin (FIRST-DUKES MOUTHWASH) SUSP 5cc's swish and spit tid prn (Patient not taking: Reported on 11/21/2017)   Facility-Administered Encounter Medications as of 11/21/2017  Medication  . pneumococcal 13-valent conjugate vaccine (PREVNAR 13) injection 0.5 mL    Review of Systems  Constitutional: Negative for appetite change and unexpected weight change.  HENT: Negative for congestion and sinus pressure.   Respiratory: Negative for cough, chest tightness and shortness of breath.   Cardiovascular: Negative for chest pain, palpitations and leg swelling.  Gastrointestinal:  Negative for abdominal pain, diarrhea, nausea and vomiting.  Genitourinary: Negative for difficulty urinating and dysuria.  Musculoskeletal: Negative for myalgias.       Some knee swelling previously.    Skin: Negative for color change and rash.  Neurological: Negative for dizziness, light-headedness and headaches.  Psychiatric/Behavioral: Negative for agitation and dysphoric mood.       Objective:    Physical Exam  Constitutional: She appears well-developed and well-nourished. No distress.  HENT:  Nose: Nose normal.  Mouth/Throat: Oropharynx is clear and moist.  Neck: Neck supple. No thyromegaly present.  Cardiovascular: Normal rate and regular rhythm.  Pulmonary/Chest: Breath sounds normal. No respiratory distress. She has no wheezes.  Abdominal: Soft. Bowel sounds are normal. There is no tenderness.  Musculoskeletal: She exhibits no edema or tenderness.  No significant soft tissue swelling - left knee.  No increased erythema.    Lymphadenopathy:    She has no cervical adenopathy.  Skin: No rash noted. No erythema.  Psychiatric: She has a normal mood and affect. Her behavior is normal.    BP 132/64 (BP Location: Left Arm, Patient Position: Sitting, Cuff Size: Normal)   Pulse 71   Temp (!) 97.5 F (36.4 C) (Oral)   Resp 20   Wt 102 lb (46.3 kg)   SpO2 97%   BMI 20.60 kg/m  Wt Readings from Last 3 Encounters:  11/21/17 102 lb (46.3 kg)  09/14/17 98 lb 1.9 oz (44.5 kg)  07/24/17 98 lb 3.2 oz (44.5 kg)     Lab Results  Component Value Date   WBC 7.1 11/21/2017   HGB 11.2 (L) 11/21/2017   HCT 33.5 (L) 11/21/2017   PLT 198.0 11/21/2017   GLUCOSE 93 11/21/2017   ALT 11 11/21/2017   AST 16 11/21/2017   NA 140 11/21/2017   K 4.4 11/21/2017   CL 102 11/21/2017   CREATININE 0.77 11/21/2017   BUN 20 11/21/2017   CO2 30 11/21/2017   TSH 1.89 11/21/2017   INR 0.9 08/30/2013    Dg Chest 2 View  Result Date: 05/07/2017 CLINICAL DATA:  Fall. EXAM: CHEST  2 VIEW  COMPARISON:  Chest x-ray dated Feb 01, 2016. FINDINGS: The cardiomediastinal silhouette is normal in size. Normal pulmonary vascularity. Atherosclerotic calcification of the aortic arch. No focal consolidation, pleural effusion, or pneumothorax. Chronic bilateral rib fractures are again noted. Unchanged accentuated thoracic kyphosis with mild wedging of multiple midthoracic vertebral bodies. Grade 1 anterolisthesis of L5 on S1. Severe degenerative changes of the bilateral shoulders. Prior cholecystectomy. IMPRESSION: 1. No active cardiopulmonary disease. 2. No definite acute fracture. Electronically Signed   By: Titus Dubin M.D.   On: 05/07/2017 14:39   Dg Wrist Complete Left  Result Date: 05/07/2017 CLINICAL DATA:  Left wrist  pain and bruising after fall. EXAM: LEFT WRIST - COMPLETE 3+ VIEW COMPARISON:  None. FINDINGS: Plate and screw fixation of the fourth and fifth proximal metacarpals. Osteoarthritic joint space narrowing and sclerosis of the first MCP, CMC and triscaphe articulations of the hand and wrist. Minimal degenerative spurring is seen about these joints. There is chondrocalcinosis of the triangular fibrocartilage complex and radiocarpal joint which can be seen in calcium pyrophosphate deposition disease (pseudogout). Accessory ossicle is suggested off the tip of the ulnar styloid versus old ununited ulnar styloid fracture. No acute fracture dislocation about the wrist and distal forearm. IMPRESSION: 1. Osteoarthritis of the MCP and CMC articulations of the thumb. 2. Osteoarthritis of the triscaphe joint of the wrist. 3. Chondrocalcinosis the triangular fibrocartilage complex and radiocarpal joint. 4. No acute fracture nor dislocations. 5. ORIF of the fourth and fifth metacarpals.  No hardware failure. Electronically Signed   By: Ashley Royalty M.D.   On: 05/07/2017 18:02   Ct Head Wo Contrast  Result Date: 05/07/2017 CLINICAL DATA:  Fall with left facial injury. EXAM: CT HEAD WITHOUT CONTRAST  TECHNIQUE: Contiguous axial images were obtained from the base of the skull through the vertex without intravenous contrast. COMPARISON:  01/30/2017 head CT. FINDINGS: Brain: No evidence of parenchymal hemorrhage or extra-axial fluid collection. No mass lesion, mass effect, or midline shift. No CT evidence of acute infarction. Nonspecific moderate subcortical and periventricular white matter hypodensity, most in keeping with chronic small vessel ischemic change. Cerebral volume is age appropriate. No ventriculomegaly. Vascular: No acute abnormality. Skull: No evidence of calvarial fracture. Sinuses/Orbits: No fluid levels. Mild mucoperiosteal thickening in the bilateral ethmoidal air cells and left sphenoid sinus. Other:  The mastoid air cells are unopacified. IMPRESSION: 1. No evidence of acute intracranial abnormality. No evidence of calvarial fracture. 2. Moderate chronic small vessel ischemic change. 3. Mild chronic appearing paranasal sinusitis. Electronically Signed   By: Ilona Sorrel M.D.   On: 05/07/2017 14:08       Assessment & Plan:   Problem List Items Addressed This Visit    Anemia - Primary    Follow cbc.       Relevant Orders   CBC with Differential/Platelet (Completed)   Ferritin (Completed)   Hypertension    Blood pressure under good control.  Continue same medication regimen.  Follow pressures.  Follow metabolic panel.        Relevant Orders   Hepatic function panel (Completed)   TSH (Completed)   Basic metabolic panel (Completed)   Inflammatory arthritis    Followed by Dr Jefm Bryant.  Overall stable.        Osteoporosis    Has received reclast.  Follow.        Relevant Orders   VITAMIN D 25 Hydroxy (Vit-D Deficiency, Fractures) (Completed)    Other Visit Diagnoses    Bad odor of urine       check urine for infection.  await culture results.     Relevant Orders   Urinalysis, Routine w reflex microscopic (Completed)   Urine Culture (Completed)       Einar Pheasant, MD

## 2017-11-23 ENCOUNTER — Other Ambulatory Visit: Payer: Self-pay | Admitting: Internal Medicine

## 2017-11-23 LAB — URINE CULTURE
MICRO NUMBER: 90319402
SPECIMEN QUALITY: ADEQUATE

## 2017-11-23 MED ORDER — FOSFOMYCIN TROMETHAMINE 3 G PO PACK: 3.0000 g | PACK | Freq: Once | ORAL | 0 refills | Status: AC

## 2017-11-23 NOTE — Progress Notes (Signed)
rx sent in for monurol  

## 2017-11-24 ENCOUNTER — Encounter: Payer: Self-pay | Admitting: Internal Medicine

## 2017-11-24 NOTE — Assessment & Plan Note (Signed)
Blood pressure under good control.  Continue same medication regimen.  Follow pressures.  Follow metabolic panel.   

## 2017-11-24 NOTE — Assessment & Plan Note (Signed)
Has received reclast.  Follow.

## 2017-11-24 NOTE — Assessment & Plan Note (Signed)
Follow cbc.  

## 2017-11-24 NOTE — Assessment & Plan Note (Signed)
Followed by Dr Jefm Bryant.  Overall stable.

## 2017-11-27 ENCOUNTER — Telehealth: Payer: Self-pay | Admitting: Internal Medicine

## 2017-11-27 NOTE — Telephone Encounter (Signed)
Patients daughter is requesting that we change the antibiotic that was prescribed. Patient was given Monurol. Please advise.

## 2017-11-27 NOTE — Telephone Encounter (Signed)
See last unrouted message.  

## 2017-11-27 NOTE — Telephone Encounter (Signed)
Copied from [place]. Topic: Quick Communication - See Telephone Encounter >> Nov 27, 2017  8:47 AM Bea Graff, NT wrote: CRM for notification. See Telephone encounter for: Pts daughter, [name] calling and states insurance will not cover the medicine for the UTI that Dr. Nicki Reaper called in Friday and medication needs to be changed. [employer]  11/27/17.

## 2017-11-27 NOTE — Telephone Encounter (Signed)
Advised that this would only be a one time dose and the safest option for patient to take. Patients daughter is going to call drug store and see how much Monurol costs and call back and let me know if we still need to change.

## 2017-11-27 NOTE — Telephone Encounter (Signed)
Why do they want to change?  This is just a 1x antibiotic.  You only take one dose and then done.  If she has taken one dose - should be finished with abx.

## 2017-11-27 NOTE — Telephone Encounter (Signed)
Patients daughter Heidi [name] calling back to find out why something else has not been called in yet. The initial message BELOW from 8:49am states that the medication Monurol, needs to be changed because her insurance will not cover it.

## 2017-11-27 NOTE — Telephone Encounter (Signed)
Patients daughter would like for [place] to do a PA on Monurol. Joelene Millin from [facility] is faxing over paperwork for covermymeds so I can submit PA

## 2017-11-27 NOTE — Telephone Encounter (Signed)
Please advise 

## 2017-11-27 NOTE — Telephone Encounter (Signed)
Just let me know.

## 2017-11-28 NOTE — Telephone Encounter (Signed)
Noted. Let me know if any questions. 

## 2017-11-28 NOTE — Telephone Encounter (Signed)
PA for [facility] sent to plan as urgent via cover my meds.   Key: Heidi [name]

## 2017-11-28 NOTE — Telephone Encounter (Signed)
PA was approved. Called daughter and left message to call back

## 2018-01-04 ENCOUNTER — Other Ambulatory Visit: Payer: Self-pay | Admitting: Internal Medicine

## 2018-01-07 DIAGNOSIS — L84 Corns and callosities: Secondary | ICD-10-CM | POA: Diagnosis not present

## 2018-01-14 DIAGNOSIS — H35033 Hypertensive retinopathy, bilateral: Secondary | ICD-10-CM | POA: Diagnosis not present

## 2018-01-15 ENCOUNTER — Ambulatory Visit: Payer: Self-pay | Admitting: *Deleted

## 2018-01-15 DIAGNOSIS — R0600 Dyspnea, unspecified: Secondary | ICD-10-CM | POA: Diagnosis not present

## 2018-01-15 DIAGNOSIS — N390 Urinary tract infection, site not specified: Secondary | ICD-10-CM | POA: Diagnosis not present

## 2018-01-15 DIAGNOSIS — M25476 Effusion, unspecified foot: Secondary | ICD-10-CM | POA: Diagnosis not present

## 2018-01-15 NOTE — Telephone Encounter (Signed)
I returned the call to Ceasar Mons the daughter who had called in c/o of mother having urinary frequency with swelling of feet and ankles too.     She put her mother on the line so I was able to talk with her directly.   See triage notes.   When I mentioned she needed to be seen today she said,  "I'm going let you talk with my daughter".      [name] got back on the phone with me.  All the providers at [facility] are booked all this week.  I have recommended she go to the urgent care.    She is not sure she can get her mother to the urgent care today but she is going to call someone in the family and see if they can possibly take her.   I let [name] know her mother needed to be seen today due to the swelling in her feet and ankles and possible UTI.   Due to her age her mother could deteriorate quickly.  [name] stated,  "It may be tomorrow before I can get someone to take her."    I went over the s/s for them to watch for that would indicate she needs to go to the ED/urgent care before tomorrow.   I went over the inability to urinate, pain in her abd or flank area, fever, body aches.   Also to watch for shortness of breath due to the fluid retention.   Any of these occur her mother needs to go to the ED even if they need to call 911 to get her there.  [name] verbalized understanding of these s/s to watch for and said she will try to find someone to take her mother today if possible.   Reason for Disposition . Urinating more frequently than usual (i.e., frequency)  Answer Assessment - Initial Assessment Questions 1. SYMPTOM: "What's the main symptom you're concerned about?" (e.g., frequency, incontinence)     I'm going to the bathroom every hour.    2. ONSET: "When did the  ________  start?"     About a week ago. 3. PAIN: "Is there any pain?" If so, ask: "How bad is it?" (Scale: 1-10; mild, moderate, severe)     No burning.   No abd pain.      4. CAUSE: "What do you think  is causing the symptoms?"     UTI 5. OTHER SYMPTOMS: "Do you have any other symptoms?" (e.g., fever, flank pain, blood in urine, pain with urination)     Left is swollen a little bit but the right one is more swollen.   My feet and ankles and it's beginning to come up my legs.   Been over a week since the swelling started.      One time it happened because of my kidneys and the doctor put me on a fluid pill which helped.   Not on a fluid pill now. 6. PREGNANCY: "Is there any chance you are pregnant?" "When was your last menstrual period?"     N/A  Protocols used: URINARY [facility]

## 2018-01-17 ENCOUNTER — Emergency Department
Admission: EM | Admit: 2018-01-17 | Discharge: 2018-01-18 | Disposition: A | Discharge status: Home / Self Care | Payer: Medicare Other | Attending: Emergency Medicine | Admitting: Emergency Medicine

## 2018-01-17 ENCOUNTER — Encounter: Payer: Self-pay | Admitting: Emergency Medicine

## 2018-01-17 ENCOUNTER — Emergency Department: Payer: Medicare Other

## 2018-01-17 ENCOUNTER — Other Ambulatory Visit: Payer: Self-pay

## 2018-01-17 DIAGNOSIS — W010XXA Fall on same level from slipping, tripping and stumbling without subsequent striking against object, initial encounter: Secondary | ICD-10-CM | POA: Insufficient documentation

## 2018-01-17 DIAGNOSIS — S51812A Laceration without foreign body of left forearm, initial encounter: Secondary | ICD-10-CM | POA: Insufficient documentation

## 2018-01-17 DIAGNOSIS — Z23 Encounter for immunization: Secondary | ICD-10-CM | POA: Insufficient documentation

## 2018-01-17 DIAGNOSIS — S51811A Laceration without foreign body of right forearm, initial encounter: Secondary | ICD-10-CM | POA: Insufficient documentation

## 2018-01-17 DIAGNOSIS — Y9301 Activity, walking, marching and hiking: Secondary | ICD-10-CM | POA: Insufficient documentation

## 2018-01-17 DIAGNOSIS — Y92002 Bathroom of unspecified non-institutional (private) residence single-family (private) house as the place of occurrence of the external cause: Secondary | ICD-10-CM | POA: Insufficient documentation

## 2018-01-17 DIAGNOSIS — I10 Essential (primary) hypertension: Secondary | ICD-10-CM | POA: Insufficient documentation

## 2018-01-17 DIAGNOSIS — Z7982 Long term (current) use of aspirin: Secondary | ICD-10-CM | POA: Diagnosis not present

## 2018-01-17 DIAGNOSIS — Z96652 Presence of left artificial knee joint: Secondary | ICD-10-CM | POA: Diagnosis not present

## 2018-01-17 DIAGNOSIS — Z79899 Other long term (current) drug therapy: Secondary | ICD-10-CM | POA: Diagnosis not present

## 2018-01-17 DIAGNOSIS — W19XXXA Unspecified fall, initial encounter: Secondary | ICD-10-CM

## 2018-01-17 DIAGNOSIS — M25511 Pain in right shoulder: Secondary | ICD-10-CM | POA: Diagnosis not present

## 2018-01-17 DIAGNOSIS — Y999 Unspecified external cause status: Secondary | ICD-10-CM | POA: Diagnosis not present

## 2018-01-17 DIAGNOSIS — T148XXA Other injury of unspecified body region, initial encounter: Secondary | ICD-10-CM | POA: Diagnosis not present

## 2018-01-17 DIAGNOSIS — S51819A Laceration without foreign body of unspecified forearm, initial encounter: Secondary | ICD-10-CM

## 2018-01-17 DIAGNOSIS — S0990XA Unspecified injury of head, initial encounter: Secondary | ICD-10-CM | POA: Insufficient documentation

## 2018-01-17 MED ORDER — TETANUS-DIPHTH-ACELL PERTUSSIS 5-2.5-18.5 LF-MCG/0.5 IM SUSP
0.5000 mL | Freq: Once | INTRAMUSCULAR | Status: AC
  Administered 2018-01-17: 0.5 mL via INTRAMUSCULAR
  Filled 2018-01-17: qty 0.5

## 2018-01-17 NOTE — ED Provider Notes (Signed)
[facility] Emergency Department Provider Note   ____________________________________________   First MD Initiated Contact with Patient 01/17/18 2309     (approximate)  I have reviewed the triage vital signs and the nursing notes.   HISTORY  Chief Complaint Fall    HPI Heidi [name] is a 84 y.o. female Patient falling more frequently than usual recently treated  for UTI, patient was turning away from the bathroom sink after removing her dentures when she lost her balance and fall and fell. Her daughter caught her and was able to break her fall but she still had ground. Patient complaining of some arm pain now her hip and shoulder pain has resolved. Patient has a skin tear on the right forearm and a small on the left forearm. Patient is moving her shoulders and elbows and hips equally and without pain.   Past Medical History:  Diagnosis Date  . Anemia   . Asthma   . GERD (gastroesophageal reflux disease)   . Hypertension   . Inflammatory arthritis    elevated ESR, negative temporal artery bx, low titer rheumatoid factor  . Nephrolithiasis   . OA (osteoarthritis)    Left knee replacement, hands, shoulders, cervial spine  . Osteoporosis    vitamin D deficiency, nasal miacalcin, reclast, previous rib fracture  . Pancreatitis    s/p cholecystectomy with ERCP and stone extraction  . Vitamin D deficiency     Patient Active Problem List   Diagnosis Date Noted  . Weakness 11/19/2016  . Visit for suture removal 07/07/2016  . Skin lesion of cheek 04/10/2015  . Gas 04/10/2015  . Loss of weight 04/08/2015  . Rib pain on right side 08/16/2014  . Shoulder pain 08/16/2014  . Lower extremity edema 01/20/2014  . Leg pain 01/20/2014  . Rash 09/12/2013  . Urinary frequency 09/12/2013  . Headache 02/19/2013  . Dizziness 02/19/2013  . Inflammatory arthritis 08/16/2012  . Osteoarthritis 08/16/2012  . Osteoporosis 08/16/2012  . Anemia 08/16/2012  .  GERD (gastroesophageal reflux disease) 08/16/2012  . Hypertension 08/16/2012    Past Surgical History:  Procedure Laterality Date  . ABDOMINAL HYSTERECTOMY     secondary  to bleeding  . LITHOTRIPSY    . REPLACEMENT TOTAL KNEE     Left knee replacement    Prior to Admission medications   Medication Sig Start Date End Date Taking? Authorizing Provider  acetaminophen (TYLENOL) 650 MG CR tablet Take 1,300 mg by mouth 2 (two) times daily.    Yes [provider]  amLODipine (NORVASC) 5 MG tablet Take 1 tablet (5 mg total) by mouth daily. 03/21/17  Yes Einar Pheasant, MD  aspirin EC 81 MG tablet Take 81 mg by mouth daily.   Yes [provider]  B Complex-C (B-COMPLEX WITH VITAMIN C) tablet Take 1 tablet by mouth daily.   Yes [provider]  Calcium Carbonate-Vitamin D (CALCIUM 600+D) 600-200 MG-UNIT TABS Take 1 tablet by mouth 2 (two) times daily.   Yes [provider]  Cholecalciferol (VITAMIN D) 2000 UNITS tablet Take 2,000 Units by mouth daily.   Yes [provider]  diclofenac sodium (VOLTAREN) 1 % GEL APPLY 4 GRAMS TOPICALLY TWO TIMES DAILY 03/21/17  Yes Einar Pheasant, MD  metoprolol succinate (TOPROL-XL) 25 MG 24 hr tablet TAKE (1/2) TABLET BY MOUTH ONCE DAILY. 01/04/18  Yes Einar Pheasant, MD  montelukast (SINGULAIR) 10 MG tablet Take 1 tablet (10 mg total) by mouth daily. 03/21/17  Yes Einar Pheasant, MD  MONUROL 3 g PACK  01/15/18  Yes [provider]  vitamin C (ASCORBIC ACID) 500 MG tablet Take 500 mg by mouth daily.   Yes [provider]  vitamin E 400 UNIT capsule Take 400 Units by mouth daily.   Yes [provider]    Allergies Anhydrous base; Aspirin; Carbapenems; Cephalosporins; Levaquin [levofloxacin in d5w]; Penicillins; Sulfa antibiotics; Sulfonylureas; Thiazide-type diuretics; Tramadol; Macrobid [nitrofurantoin monohyd macro]; and Omnicef [cefdinir]  Family History  Problem Relation Age of Onset  .  Hypertension Mother   . Stroke Mother   . Stomach cancer Father   . Arthritis Brother   . Lung cancer Brother     Social History Social History   Tobacco Use  . Smoking status: Never Smoker  . Smokeless tobacco: Never Used  Substance Use Topics  . Alcohol use: No    Alcohol/week: 0.0 oz  . Drug use: No    Review of Systems  Constitutional: No fever/chills Eyes: No visual changes. ENT: No sore throat. Cardiovascular: Denies chest pain. Respiratory: Denies shortness of breath. Gastrointestinal: No abdominal pain.  No nausea, no vomiting.  No diarrhea.  No constipation. Genitourinary: Negative for dysuria. Musculoskeletal: Negative for back pain. Skin: Negative for rash. Neurological: Negative for headaches, focal weakness  ____________________________________________   PHYSICAL EXAM:  VITAL SIGNS: ED Triage Vitals  Enc Vitals Group     BP 01/17/18 2249 (!) 173/96     Pulse Rate 01/17/18 2249 85     Resp --      Temp 01/17/18 2249 97.7 F (36.5 C)     Temp Source 01/17/18 2249 Oral     SpO2 01/17/18 2249 98 %     Weight 01/17/18 2252 105 lb (47.6 kg)     Height 01/17/18 2252 '5\' 5"'  (1.651 m)     Head Circumference --      Peak Flow --      Pain Score 01/17/18 2251 0     Pain Loc --      Pain Edu? --      Excl. in [place]? --     Constitutional: Alert and oriented. Well appearing and in no acute distress. Eyes: Conjunctivae are normal.  Head: Atraumatic. Nose: No congestion/rhinnorhea. Mouth/Throat: Mucous membranes are moist.  Oropharynx non-erythematous. Neck: No stridor.  No cervical spine tenderness to palpation. Cardiovascular: Normal rate, regular rhythm. Grossly normal heart sounds.  Good peripheral circulation. Respiratory: Normal respiratory effort.  No retractions. Lungs CTAB. Gastrointestinal: Soft and nontender. No distention. No abdominal bruits. No CVA tenderness. Musculoskeletal: No lower extremity tenderness bilateral 1+edema.  Neurologic:   Normal speech and language. No gross focal neurologic deficits are appreciated. Skin:  Skin is warm, dry and intactexcept for as noted in history of present illness. No rash noted. Psychiatric: Mood and affect are normal. Speech and behavior are normal.  ____________________________________________   LABS (all labs ordered are listed, but only abnormal results are displayed)  Labs Reviewed  COMPREHENSIVE METABOLIC PANEL - Abnormal; Notable for the following components:      Result Value   Glucose, Bld 108 (*)    BUN 23 (*)    GFR calc non Af Amer 56 (*)    All other components within normal limits  CBC WITH DIFFERENTIAL/PLATELET - Abnormal; Notable for the following components:   RBC 3.75 (*)    Hemoglobin 11.9 (*)    All other components within normal limits  URINALYSIS, COMPLETE (UACMP) WITH MICROSCOPIC - Abnormal; Notable for the following components:  Color, Urine YELLOW (*)    APPearance HAZY (*)    Ketones, ur 5 (*)    Bacteria, UA RARE (*)    All other components within normal limits   ____________________________________________  EKG  EKG read and interpreted by me shows normal sinus rhythm rate of 66 slight left axis no acute ST-T wave changes ____________________________________________  RADIOLOGY  ED MD interpretation:  CT of the head read by radiology as normal  Official radiology report(s): Ct Head Wo Contrast  Result Date: 01/17/2018 CLINICAL DATA:  Fall, head trauma EXAM: CT HEAD WITHOUT CONTRAST TECHNIQUE: Contiguous axial images were obtained from the base of the skull through the vertex without intravenous contrast. COMPARISON:  05/07/2017 FINDINGS: Brain: No evidence of acute infarction, hemorrhage, hydrocephalus, extra-axial collection or mass lesion/mass effect. Cortical atrophy. Subcortical white matter and periventricular small vessel ischemic changes. Vascular: Intracranial atherosclerosis. Skull: Normal. Negative for fracture or focal lesion.  Sinuses/Orbits: The visualized paranasal sinuses are essentially clear. The mastoid air cells are unopacified. Other: None. IMPRESSION: No evidence of acute intracranial abnormality. Atrophy with small vessel ischemic changes. Electronically Signed   By: Julian Hy M.D.   On: 01/17/2018 23:41    ____________________________________________   PROCEDURES  Procedure(s) performed:  Procedures  Critical Care performed:   ____________________________________________   INITIAL IMPRESSION / ASSESSMENT AND PLAN / ED COURSE family reports patient is not walking much is sitting around a lot and now is having swelling in her feet.  family reports when she had her UTI treated she was seen in the office today didn't EKG and chest x-ray at that time to evaluate the swelling in her feet and they were within normal limits.  urine looks clear labs are okay EKG and CT of the head as well. The patient apparently just lost her balance. She does have some skin tears which we have dressed. We'll have her follow-up with her doctor. Her doctor can keep an eye on the skin tears do any further evaluation of her falls.     ____________________________________________   FINAL CLINICAL IMPRESSION(S) / ED DIAGNOSES  Final diagnoses:  Fall, initial encounter  Skin tear of forearm without complication, initial encounter     ED Discharge Orders    None       Note:  This document was prepared using Dragon voice recognition software and may include unintentional dictation errors.    Nena Polio, MD 01/18/18 [phone]

## 2018-01-17 NOTE — ED Triage Notes (Signed)
Pt arrived via EMS from home after a fall. Reporting pain to both shoulders and (L) hip pain.

## 2018-01-18 ENCOUNTER — Telehealth: Payer: Self-pay

## 2018-01-18 DIAGNOSIS — S51811A Laceration without foreign body of right forearm, initial encounter: Secondary | ICD-10-CM | POA: Diagnosis not present

## 2018-01-18 LAB — URINALYSIS, COMPLETE (UACMP) WITH MICROSCOPIC
BILIRUBIN URINE: NEGATIVE
Glucose, UA: NEGATIVE mg/dL
HGB URINE DIPSTICK: NEGATIVE
Ketones, ur: 5 mg/dL — AB
Leukocytes, UA: NEGATIVE
NITRITE: NEGATIVE
Protein, ur: NEGATIVE mg/dL
Specific Gravity, Urine: 1.01 (ref 1.005–1.030)
pH: 7 (ref 5.0–8.0)

## 2018-01-18 LAB — CBC WITH DIFFERENTIAL/PLATELET
BASOS ABS: 0 10*3/uL (ref 0–0.1)
Basophils Relative: 1 %
Eosinophils Absolute: 0 10*3/uL (ref 0–0.7)
Eosinophils Relative: 1 %
HEMATOCRIT: 35.8 % (ref 35.0–47.0)
Hemoglobin: 11.9 g/dL — ABNORMAL LOW (ref 12.0–16.0)
Lymphocytes Relative: 26 %
Lymphs Abs: 1.4 10*3/uL (ref 1.0–3.6)
MCH: 31.7 pg (ref 26.0–34.0)
MCHC: 33.2 g/dL (ref 32.0–36.0)
MCV: 95.4 fL (ref 80.0–100.0)
MONO ABS: 0.7 10*3/uL (ref 0.2–0.9)
Monocytes Relative: 12 %
NEUTROS ABS: 3.2 10*3/uL (ref 1.4–6.5)
Neutrophils Relative %: 60 %
Platelets: 212 10*3/uL (ref 150–440)
RBC: 3.75 MIL/uL — AB (ref 3.80–5.20)
RDW: 11.9 % (ref 11.5–14.5)
WBC: 5.2 10*3/uL (ref 3.6–11.0)

## 2018-01-18 LAB — COMPREHENSIVE METABOLIC PANEL
ALT: 15 U/L (ref 14–54)
AST: 23 U/L (ref 15–41)
Albumin: 4.4 g/dL (ref 3.5–5.0)
Alkaline Phosphatase: 39 U/L (ref 38–126)
Anion gap: 5 (ref 5–15)
BUN: 23 mg/dL — ABNORMAL HIGH (ref 6–20)
CO2: 30 mmol/L (ref 22–32)
CREATININE: 0.85 mg/dL (ref 0.44–1.00)
Calcium: 9.5 mg/dL (ref 8.9–10.3)
Chloride: 104 mmol/L (ref 101–111)
GFR calc non Af Amer: 56 mL/min — ABNORMAL LOW (ref >60)
Glucose, Bld: 108 mg/dL — ABNORMAL HIGH (ref 65–99)
Potassium: 4.3 mmol/L (ref 3.5–5.1)
SODIUM: 139 mmol/L (ref 135–145)
Total Bilirubin: 0.6 mg/dL (ref 0.3–1.2)
Total Protein: 7.2 g/dL (ref 6.5–8.1)

## 2018-01-18 NOTE — Discharge Instructions (Addendum)
Keep the wounds covered. Change outer dressing every day. Follow-up with your doctor in the next 2-3 days to check and see how the wounds are doing. it may take quite some time for them to heal completely at your age. Please be careful when you standing or turning. Please keep your legs elevated as much as you can. Return for any further problems. make sure you're drinking plenty of water.

## 2018-01-18 NOTE — Telephone Encounter (Signed)
Copied from [place] #[id number]. Topic: Appointment Scheduling - Scheduling Inquiry for Clinic >> Jan 18, 2018  3:01 PM Clack, Laban Emperor wrote: Reason for CRM: [name] calling to make the pt an appt for an ER f/u. Please f/u.

## 2018-01-23 NOTE — Telephone Encounter (Signed)
Ok

## 2018-01-23 NOTE — Telephone Encounter (Signed)
LMTCB

## 2018-01-23 NOTE — Telephone Encounter (Signed)
Ok to schedule 5/29 at 12:30?

## 2018-01-25 ENCOUNTER — Emergency Department: Payer: Medicare Other

## 2018-01-25 ENCOUNTER — Other Ambulatory Visit: Payer: Self-pay

## 2018-01-25 ENCOUNTER — Encounter: Payer: Self-pay | Admitting: Emergency Medicine

## 2018-01-25 ENCOUNTER — Emergency Department
Admission: EM | Admit: 2018-01-25 | Discharge: 2018-01-26 | Disposition: A | Discharge status: Home / Self Care | Payer: Medicare Other | Attending: Emergency Medicine | Admitting: Emergency Medicine

## 2018-01-25 DIAGNOSIS — R079 Chest pain, unspecified: Secondary | ICD-10-CM | POA: Diagnosis not present

## 2018-01-25 DIAGNOSIS — I10 Essential (primary) hypertension: Secondary | ICD-10-CM | POA: Diagnosis not present

## 2018-01-25 DIAGNOSIS — Z79899 Other long term (current) drug therapy: Secondary | ICD-10-CM | POA: Diagnosis not present

## 2018-01-25 DIAGNOSIS — J45909 Unspecified asthma, uncomplicated: Secondary | ICD-10-CM | POA: Insufficient documentation

## 2018-01-25 DIAGNOSIS — Z96652 Presence of left artificial knee joint: Secondary | ICD-10-CM | POA: Insufficient documentation

## 2018-01-25 DIAGNOSIS — Z7982 Long term (current) use of aspirin: Secondary | ICD-10-CM | POA: Diagnosis not present

## 2018-01-25 LAB — CBC
HCT: 33.4 % — ABNORMAL LOW (ref 35.0–47.0)
Hemoglobin: 11.1 g/dL — ABNORMAL LOW (ref 12.0–16.0)
MCH: 31.9 pg (ref 26.0–34.0)
MCHC: 33.1 g/dL (ref 32.0–36.0)
MCV: 96.4 fL (ref 80.0–100.0)
PLATELETS: 211 10*3/uL (ref 150–440)
RBC: 3.46 MIL/uL — AB (ref 3.80–5.20)
RDW: 12 % (ref 11.5–14.5)
WBC: 7.9 10*3/uL (ref 3.6–11.0)

## 2018-01-25 NOTE — ED Triage Notes (Addendum)
Pt to Rm 13 from home with report of intermittent substernal chest pain with no radiation. Per EMS pt also fell one week ago and hit her chest.

## 2018-01-25 NOTE — Telephone Encounter (Signed)
LMTCB 5/29 slot has been used.

## 2018-01-26 ENCOUNTER — Other Ambulatory Visit: Payer: Medicare Other

## 2018-01-26 ENCOUNTER — Emergency Department: Payer: Medicare Other

## 2018-01-26 DIAGNOSIS — R079 Chest pain, unspecified: Secondary | ICD-10-CM | POA: Diagnosis not present

## 2018-01-26 LAB — BASIC METABOLIC PANEL
ANION GAP: 8 (ref 5–15)
BUN: 31 mg/dL — ABNORMAL HIGH (ref 6–20)
CALCIUM: 9.8 mg/dL (ref 8.9–10.3)
CO2: 26 mmol/L (ref 22–32)
CREATININE: 0.81 mg/dL (ref 0.44–1.00)
Chloride: 104 mmol/L (ref 101–111)
GFR, EST NON AFRICAN AMERICAN: 59 mL/min — AB (ref >60)
Glucose, Bld: 101 mg/dL — ABNORMAL HIGH (ref 65–99)
Potassium: 3.9 mmol/L (ref 3.5–5.1)
Sodium: 138 mmol/L (ref 135–145)

## 2018-01-26 LAB — TROPONIN I: Troponin I: <0.03 ng/mL (ref <0.03)

## 2018-01-26 MED ORDER — SODIUM CHLORIDE 0.9 % IV BOLUS: 500.0000 mL | Freq: Once | INTRAVENOUS | Status: DC

## 2018-01-26 NOTE — ED Provider Notes (Signed)
[facility] Emergency Department Provider Note   ____________________________________________   First MD Initiated Contact with Patient 01/25/18 2335     (approximate)  I have reviewed the triage vital signs and the nursing notes.   HISTORY  Chief Complaint Chest Pain    HPI Heidi [name] is a 84 y.o. female Who comes into the hospital today with some chest pain.  The patient has some forgetfulness according to her family with many different family members.  The patient was here last week after a fall.  She does have some bruising to her arms and chest.  But they report that the patient's been complaining of some chest pain at 7 PM.  She was saying that it was coming going and she does live at home the patient states though that she also fell this morning and hit the right side of her chest although the patient's family adamantly refuses at that would have happened.  The patient says that her chest is just a little sore from where she hit it but denies any out right pain at this time.  The patient has no shortness of breath, nausea, vomiting, dizziness or lightheadedness.  The patient's family states that she was doing well until today.  She is here for evaluation.   Past Medical History:  Diagnosis Date  . Anemia   . Asthma   . GERD (gastroesophageal reflux disease)   . Hypertension   . Inflammatory arthritis    elevated ESR, negative temporal artery bx, low titer rheumatoid factor  . Nephrolithiasis   . OA (osteoarthritis)    Left knee replacement, hands, shoulders, cervial spine  . Osteoporosis    vitamin D deficiency, nasal miacalcin, reclast, previous rib fracture  . Pancreatitis    s/p cholecystectomy with ERCP and stone extraction  . Vitamin D deficiency     Patient Active Problem List   Diagnosis Date Noted  . Weakness 11/19/2016  . Visit for suture removal 07/07/2016  . Skin lesion of cheek 04/10/2015  . Gas 04/10/2015  . Loss of  weight 04/08/2015  . Rib pain on right side 08/16/2014  . Shoulder pain 08/16/2014  . Lower extremity edema 01/20/2014  . Leg pain 01/20/2014  . Rash 09/12/2013  . Urinary frequency 09/12/2013  . Headache 02/19/2013  . Dizziness 02/19/2013  . Inflammatory arthritis 08/16/2012  . Osteoarthritis 08/16/2012  . Osteoporosis 08/16/2012  . Anemia 08/16/2012  . GERD (gastroesophageal reflux disease) 08/16/2012  . Hypertension 08/16/2012    Past Surgical History:  Procedure Laterality Date  . ABDOMINAL HYSTERECTOMY     secondary  to bleeding  . LITHOTRIPSY    . REPLACEMENT TOTAL KNEE     Left knee replacement    Prior to Admission medications   Medication Sig Start Date End Date Taking? Authorizing Provider  amLODipine (NORVASC) 5 MG tablet Take 1 tablet (5 mg total) by mouth daily. 03/21/17  Yes Einar Pheasant, MD  aspirin EC 81 MG tablet Take 81 mg by mouth daily.   Yes [provider]  B Complex-C (B-COMPLEX WITH VITAMIN C) tablet Take 1 tablet by mouth daily.   Yes [provider]  Calcium Carbonate-Vitamin D (CALCIUM 600+D) 600-200 MG-UNIT TABS Take 1 tablet by mouth 2 (two) times daily.   Yes [provider]  Cholecalciferol (VITAMIN D) 2000 UNITS tablet Take 2,000 Units by mouth daily.   Yes [provider]  metoprolol succinate (TOPROL-XL) 25 MG 24 hr tablet TAKE (1/2) TABLET BY  MOUTH ONCE DAILY. 01/04/18  Yes Einar Pheasant, MD  vitamin C (ASCORBIC ACID) 500 MG tablet Take 500 mg by mouth daily.   Yes [provider]  vitamin E 400 UNIT capsule Take 400 Units by mouth daily.   Yes [provider]  acetaminophen (TYLENOL) 650 MG CR tablet Take 1,300 mg by mouth 2 (two) times daily.     [provider]  diclofenac sodium (VOLTAREN) 1 % GEL APPLY 4 GRAMS TOPICALLY TWO TIMES DAILY Patient not taking: Reported on 01/26/2018 03/21/17   Einar Pheasant, MD  montelukast (SINGULAIR) 10 MG tablet Take 1 tablet (10 mg total) by  mouth daily. 03/21/17   Einar Pheasant, MD    Allergies Anhydrous base; Aspirin; Carbapenems; Cephalosporins; Levaquin [levofloxacin in d5w]; Penicillins; Sulfa antibiotics; Sulfonylureas; Thiazide-type diuretics; Tramadol; Macrobid [nitrofurantoin monohyd macro]; and Omnicef [cefdinir]  Family History  Problem Relation Age of Onset  . Hypertension Mother   . Stroke Mother   . Stomach cancer Father   . Arthritis Brother   . Lung cancer Brother     Social History Social History   Tobacco Use  . Smoking status: Never Smoker  . Smokeless tobacco: Never Used  Substance Use Topics  . Alcohol use: No    Alcohol/week: 0.0 oz  . Drug use: No    Review of Systems  Constitutional: No fever/chills Eyes: No visual changes. ENT: No sore throat. Cardiovascular:  chest pain. Respiratory: Denies shortness of breath. Gastrointestinal: No abdominal pain.  No nausea, no vomiting.  No diarrhea.  No constipation. Genitourinary: Negative for dysuria. Musculoskeletal: Negative for back pain. Skin: Negative for rash. Neurological: Negative for headaches, focal weakness or numbness.   ____________________________________________   PHYSICAL EXAM:  VITAL SIGNS: ED Triage Vitals  Enc Vitals Group     BP 01/25/18 2330 (!) 164/80     Pulse Rate 01/25/18 2330 75     Resp 01/25/18 2330 18     Temp 01/25/18 2330 98.5 F (36.9 C)     Temp Source 01/25/18 2330 Oral     SpO2 01/25/18 2330 99 %     Weight 01/25/18 2332 105 lb (47.6 kg)     Height 01/25/18 2332 '5\' 5"'  (1.651 m)     Head Circumference --      Peak Flow --      Pain Score 01/25/18 2331 0     Pain Loc --      Pain Edu? --      Excl. in [place]? --     Constitutional: Alert and oriented. Well appearing and in mild distress. Eyes: Conjunctivae are normal. PERRL. EOMI. Head: Atraumatic. Nose: No congestion/rhinnorhea. Mouth/Throat: Mucous membranes are moist.  Oropharynx non-erythematous. Cardiovascular: Normal rate, regular  rhythm. Grossly normal heart sounds.  Good peripheral circulation. Respiratory: Normal respiratory effort.  No retractions. Lungs CTAB. Gastrointestinal: Soft and nontender. No distention.  Positive bowel sounds Musculoskeletal: No lower extremity tenderness nor edema.  Neurologic:  Normal speech and language.  Skin:  Skin is warm, dry and intact.  Bruising to patient's arms, bruising to patient's right breast. Psychiatric: Mood and affect are normal.   ____________________________________________   LABS (all labs ordered are listed, but only abnormal results are displayed)  Labs Reviewed  BASIC METABOLIC PANEL - Abnormal; Notable for the following components:      Result Value   Glucose, Bld 101 (*)    BUN 31 (*)    GFR calc non Af Amer 59 (*)    All other  components within normal limits  CBC - Abnormal; Notable for the following components:   RBC 3.46 (*)    Hemoglobin 11.1 (*)    HCT 33.4 (*)    All other components within normal limits  TROPONIN I  TROPONIN I   ____________________________________________  EKG  ED ECG REPORT I, Loney Hering, the attending physician, personally viewed and interpreted this ECG.   Date: 01/25/2018  EKG Time: 2328  Rate: 74  Rhythm: normal sinus rhythm  Axis: normal  Intervals:none  ST&T Change: none  ____________________________________________  RADIOLOGY  ED MD interpretation:  CXR: No acute cardiopulmonary process  Official radiology report(s): Dg Chest 2 View  Result Date: 01/26/2018 CLINICAL DATA:  Acute onset of substernal chest pain. EXAM: CHEST - 2 VIEW COMPARISON:  Chest radiograph performed 05/07/2017 FINDINGS: The lungs are well-aerated and clear. There is no evidence of focal opacification, pleural effusion or pneumothorax. The heart is borderline normal in size. No acute osseous abnormalities are seen. Degenerative change is noted at both glenohumeral joints. A prominent loose body is noted at the right  glenohumeral joint. IMPRESSION: No acute cardiopulmonary process seen. Electronically Signed   By: Garald Balding M.D.   On: 01/26/2018 00:43    ____________________________________________   PROCEDURES  Procedure(s) performed: None  Procedures  Critical Care performed: No  ____________________________________________   INITIAL IMPRESSION / ASSESSMENT AND PLAN / ED COURSE  As part of my medical decision making, I reviewed the following data within the electronic MEDICAL RECORD NUMBER Notes from prior ED visits and [place] Controlled Substance Database   This is a 84 year old female who comes into the hospital today with some chest pain.  My differential diagnosis includes pneumonia, pneumothorax, acute coronary syndrome, heart failure, musculoskeletal pain.  We did check some blood work on the patient to include a CBC, BMP and a troponin.  The patient also had a chest x-ray which was negative.  The patient's work-up at this time is unremarkable.  She did urinate frequently while she was here but we were unable to obtain a specimen.  As the patient's pain is improved and her blood work is unremarkable I feel that the patient can be discharged home to follow-up with her primary care physician.      ____________________________________________   FINAL CLINICAL IMPRESSION(S) / ED DIAGNOSES  Final diagnoses:  Nonspecific chest pain     ED Discharge Orders    None       Note:  This document was prepared using Dragon voice recognition software and may include unintentional dictation errors.   Loney Hering, MD 01/26/18 [phone]

## 2018-01-26 NOTE — ED Notes (Signed)
Pt assisted to use the bedpan. Voided approximately 150 ml. Clear yellow urine.

## 2018-01-26 NOTE — ED Notes (Signed)
ED Tech in to assist pt with urinating.

## 2018-01-26 NOTE — Discharge Instructions (Signed)
Your chest pain has been evaluated today and it is unremarkable.  Please follow-up with your primary care physician as well as cardiology.

## 2018-01-28 ENCOUNTER — Telehealth: Payer: Self-pay | Admitting: *Deleted

## 2018-01-28 NOTE — Telephone Encounter (Signed)
Patient has had second ED visit for chest pain, with in a week, each visit also reports patient stating she fell. ER doctors recommended seeing cardiology with in 2 days was able to schedule with PCP 01/31/18 at 12:30 is this time ok? I saw no cardiologist on file for this patient,

## 2018-01-29 NOTE — Telephone Encounter (Signed)
Patient was able to get appointment with old cardiologist for tomorrow, but would like to follow up with you later in the next week or so.

## 2018-01-29 NOTE — Telephone Encounter (Signed)
Can see her 02/08/18 at 2:30.  Please notify.  Thanks.

## 2018-01-29 NOTE — Telephone Encounter (Signed)
I can schedule her for cardiology appt and get one asap.  Do they prefer to see cardiology first and then f/u with me after cardiology appt.  Just let me know.

## 2018-01-30 DIAGNOSIS — R0602 Shortness of breath: Secondary | ICD-10-CM | POA: Diagnosis not present

## 2018-01-30 DIAGNOSIS — M1990 Unspecified osteoarthritis, unspecified site: Secondary | ICD-10-CM | POA: Diagnosis not present

## 2018-01-30 DIAGNOSIS — R0789 Other chest pain: Secondary | ICD-10-CM | POA: Diagnosis not present

## 2018-01-30 NOTE — Telephone Encounter (Signed)
See other phone note

## 2018-01-31 ENCOUNTER — Ambulatory Visit: Payer: Medicare Other | Admitting: Internal Medicine

## 2018-01-31 NOTE — Telephone Encounter (Signed)
Left message for daughter to call office

## 2018-02-01 NOTE — Telephone Encounter (Signed)
I can see her on 02/13/18 at 12:30.

## 2018-02-01 NOTE — Telephone Encounter (Signed)
Left message for patient daughter DPR to return call to office.

## 2018-02-01 NOTE — Telephone Encounter (Signed)
Will not be back itown in time can we do the next week?

## 2018-02-06 ENCOUNTER — Telehealth: Payer: Self-pay

## 2018-02-06 NOTE — Telephone Encounter (Signed)
Copied from [place] [id number]. Topic: Inquiry >> Feb 06, 2018  9:55 AM Pricilla Handler wrote: Reason for CRM: Patient's daughter called wanting to know if Dr. Nicki Reaper can work in the patient to be seen asap. Patient has been falling quite a bit, and needs to see her PCP. Also, Patient's daughter wants to know if Dr. Nicki Reaper can prepare a DNR for the patient by tomorrow. Please call [name] at [phone].

## 2018-02-06 NOTE — Telephone Encounter (Signed)
Pt scheduled  

## 2018-02-07 DIAGNOSIS — R0602 Shortness of breath: Secondary | ICD-10-CM | POA: Diagnosis not present

## 2018-02-07 DIAGNOSIS — R0789 Other chest pain: Secondary | ICD-10-CM | POA: Diagnosis not present

## 2018-02-11 ENCOUNTER — Ambulatory Visit (INDEPENDENT_AMBULATORY_CARE_PROVIDER_SITE_OTHER): Payer: Medicare Other | Admitting: Internal Medicine

## 2018-02-11 DIAGNOSIS — R2681 Unsteadiness on feet: Secondary | ICD-10-CM | POA: Diagnosis not present

## 2018-02-11 DIAGNOSIS — I10 Essential (primary) hypertension: Secondary | ICD-10-CM | POA: Diagnosis not present

## 2018-02-11 DIAGNOSIS — M064 Inflammatory polyarthropathy: Secondary | ICD-10-CM | POA: Diagnosis not present

## 2018-02-11 DIAGNOSIS — Z7189 Other specified counseling: Secondary | ICD-10-CM | POA: Diagnosis not present

## 2018-02-11 DIAGNOSIS — R531 Weakness: Secondary | ICD-10-CM | POA: Diagnosis not present

## 2018-02-11 DIAGNOSIS — D649 Anemia, unspecified: Secondary | ICD-10-CM

## 2018-02-11 DIAGNOSIS — R600 Localized edema: Secondary | ICD-10-CM | POA: Diagnosis not present

## 2018-02-11 DIAGNOSIS — M1990 Unspecified osteoarthritis, unspecified site: Secondary | ICD-10-CM

## 2018-02-11 NOTE — Progress Notes (Signed)
Patient ID: Heidi [name], female   DOB: [birth year], 84 y.o.   MRN: [id number]   Subjective:    Patient ID: Heidi [name], female    DOB: 1918/02/26, 84 y.o.   MRN: [id number]  HPI  Patient here as a work in for ER and cardiology follow up.  She is accompanied by her daughter.  History obtained from both of them.  She complained of chest pain.  Was seen in ER. Had previous fall.  Question if pain related to the fall.  Limited w/up unrevealing.  Saw cardiology last week.  Felt to have atypical chest pain.  Recommended echo.   Had echo end of last week.  Has f/u next week with cardiology.  No chest pain currently.  Breathing stable.  Eating.  Good appetite.  Some increased leg swelling.  Has not been wearing her compression hose lately.  Is some better in the am.  No abdominal pain.  Bowels moving.  Discussed DNR.  Pt expressed that she does not want to be resuscitated or kept alive on a breathing machine.  DNR form completed.  She has had 3 falls in the last several weeks.  Legs not as strong.  More unsteady.  Discussed using her walker.  Also discussed home health - physical therapy.  Both daughter and pt are agreeable.     Past Medical History:  Diagnosis Date  . Anemia   . Asthma   . GERD (gastroesophageal reflux disease)   . Hypertension   . Inflammatory arthritis    elevated ESR, negative temporal artery bx, low titer rheumatoid factor  . Nephrolithiasis   . OA (osteoarthritis)    Left knee replacement, hands, shoulders, cervial spine  . Osteoporosis    vitamin D deficiency, nasal miacalcin, reclast, previous rib fracture  . Pancreatitis    s/p cholecystectomy with ERCP and stone extraction  . Vitamin D deficiency    Past Surgical History:  Procedure Laterality Date  . ABDOMINAL HYSTERECTOMY     secondary  to bleeding  . LITHOTRIPSY    . REPLACEMENT TOTAL KNEE     Left knee replacement   Family History  Problem Relation Age of Onset  . Hypertension Mother   .  Stroke Mother   . Stomach cancer Father   . Arthritis Brother   . Lung cancer Brother    Social History   Socioeconomic History  . Marital status: Unknown    Spouse name: Not on file  . Number of children: 5  . Years of education: Not on file  . Highest education level: Not on file  Occupational History  . Not on file  Social Needs  . Financial resource strain: Not on file  . Food insecurity:    Worry: Not on file    Inability: Not on file  . Transportation needs:    Medical: Not on file    Non-medical: Not on file  Tobacco Use  . Smoking status: Never Smoker  . Smokeless tobacco: Never Used  Substance and Sexual Activity  . Alcohol use: No    Alcohol/week: 0.0 oz  . Drug use: No  . Sexual activity: Not on file  Lifestyle  . Physical activity:    Days per week: Not on file    Minutes per session: Not on file  . Stress: Not on file  Relationships  . Social connections:    Talks on phone: Not on file    Gets together: Not on file  Attends religious service: Not on file    Active member of club or organization: Not on file    Attends meetings of clubs or organizations: Not on file    Relationship status: Not on file  Other Topics Concern  . Not on file  Social History Narrative   She is widowed. She has five children, three daughter and two son.    Outpatient Encounter Medications as of 02/11/2018  Medication Sig  . acetaminophen (TYLENOL) 650 MG CR tablet Take 1,300 mg by mouth 2 (two) times daily.   Marland Kitchen amLODipine (NORVASC) 5 MG tablet Take 1 tablet (5 mg total) by mouth daily.  Marland Kitchen aspirin EC 81 MG tablet Take 81 mg by mouth daily.  . B Complex-C (B-COMPLEX WITH VITAMIN C) tablet Take 1 tablet by mouth daily.  . Calcium Carbonate-Vitamin D (CALCIUM 600+D) 600-200 MG-UNIT TABS Take 1 tablet by mouth 2 (two) times daily.  . Cholecalciferol (VITAMIN D) 2000 UNITS tablet Take 2,000 Units by mouth daily.  . diclofenac sodium (VOLTAREN) 1 % GEL APPLY 4 GRAMS TOPICALLY  TWO TIMES DAILY  . metoprolol succinate (TOPROL-XL) 25 MG 24 hr tablet TAKE (1/2) TABLET BY MOUTH ONCE DAILY.  . montelukast (SINGULAIR) 10 MG tablet Take 1 tablet (10 mg total) by mouth daily.  . vitamin C (ASCORBIC ACID) 500 MG tablet Take 500 mg by mouth daily.  . vitamin E 400 UNIT capsule Take 400 Units by mouth daily.   Facility-Administered Encounter Medications as of 02/11/2018  Medication  . pneumococcal 13-valent conjugate vaccine (PREVNAR 13) injection 0.5 mL    Review of Systems  Constitutional: Negative for appetite change and unexpected weight change.  HENT: Negative for congestion and sinus pressure.   Respiratory: Negative for cough, chest tightness and shortness of breath.   Cardiovascular: Positive for leg swelling. Negative for palpitations.       No chest pain currently.    Gastrointestinal: Negative for abdominal pain, diarrhea, nausea and vomiting.  Genitourinary: Negative for difficulty urinating and dysuria.  Musculoskeletal: Negative for joint swelling and myalgias.  Skin: Negative for color change and rash.  Neurological: Positive for weakness. Negative for dizziness, light-headedness and headaches.       Does report more unsteadiness.    Psychiatric/Behavioral: Negative for agitation and dysphoric mood.       Objective:    Physical Exam  Constitutional: She appears well-developed and well-nourished. No distress.  HENT:  Nose: Nose normal.  Mouth/Throat: Oropharynx is clear and moist.  Neck: Neck supple. No thyromegaly present.  Cardiovascular: Normal rate and regular rhythm.  Pulmonary/Chest: Breath sounds normal. No respiratory distress. She has no wheezes.  Abdominal: Soft. Bowel sounds are normal. There is no tenderness.  Musculoskeletal: She exhibits edema. She exhibits no tenderness.  Lymphadenopathy:    She has no cervical adenopathy.  Skin: No rash noted. No erythema.  Psychiatric: She has a normal mood and affect. Her behavior is normal.     BP 128/74 (BP Location: Left Arm, Patient Position: Sitting, Cuff Size: Normal)   Pulse 75   Temp 98 F (36.7 C) (Oral)   Resp 18   Wt 104 lb 9.6 oz (47.4 kg)   SpO2 97%   BMI 17.41 kg/m  Wt Readings from Last 3 Encounters:  02/11/18 104 lb 9.6 oz (47.4 kg)  01/25/18 105 lb (47.6 kg)  01/17/18 105 lb (47.6 kg)     Lab Results  Component Value Date   WBC 7.9 01/25/2018   HGB 11.1 (L)  01/25/2018   HCT 33.4 (L) 01/25/2018   PLT 211 01/25/2018   GLUCOSE 101 (H) 01/25/2018   ALT 15 01/17/2018   AST 23 01/17/2018   NA 138 01/25/2018   K 3.9 01/25/2018   CL 104 01/25/2018   CREATININE 0.81 01/25/2018   BUN 31 (H) 01/25/2018   CO2 26 01/25/2018   TSH 1.89 11/21/2017   INR 0.9 08/30/2013    Dg Chest 2 View  Result Date: 01/26/2018 CLINICAL DATA:  Acute onset of substernal chest pain. EXAM: CHEST - 2 VIEW COMPARISON:  Chest radiograph performed 05/07/2017 FINDINGS: The lungs are well-aerated and clear. There is no evidence of focal opacification, pleural effusion or pneumothorax. The heart is borderline normal in size. No acute osseous abnormalities are seen. Degenerative change is noted at both glenohumeral joints. A prominent loose body is noted at the right glenohumeral joint. IMPRESSION: No acute cardiopulmonary process seen. Electronically Signed   By: Garald Balding M.D.   On: 01/26/2018 00:43       Assessment & Plan:   Problem List Items Addressed This Visit    Anemia    Recent hgb checked in ER.  Stable.  Follow cbc.       DNR (do not resuscitate) discussion    Discussed with pt and daughter today.  She does not desire to be resuscitated.  (no chest compressions, no ventilator, etc).  DNR form completed.        Hypertension    Blood pressure under good control.  Continue same medication regimen.  Follow pressures.  Follow metabolic panel.        Inflammatory polyarthropathy ([place])    Has been followed by Dr Jefm Bryant.  Stable.       Lower extremity edema     No evidence of fluid in lungs.  Has not been wearing her compression hose.  Recommend compression hose, leg elevation and monitor increased sodium intake.  Follow.        Unsteady gait    Recent falls, unsteady gait and weakness as outlined.  Discussed using her walker.  Also arrange physical therapy through home health.        Relevant Orders   Ambulatory referral to Home Health   Weakness    Some weakness as outlined.  More unsteady.  Recent falls.  Will arrange home health physical therapy.        Relevant Orders   Ambulatory referral to Home Health       Einar Pheasant, MD

## 2018-02-12 ENCOUNTER — Encounter: Payer: Self-pay | Admitting: Internal Medicine

## 2018-02-12 DIAGNOSIS — Z7189 Other specified counseling: Secondary | ICD-10-CM | POA: Insufficient documentation

## 2018-02-12 DIAGNOSIS — R2681 Unsteadiness on feet: Secondary | ICD-10-CM | POA: Insufficient documentation

## 2018-02-12 NOTE — Assessment & Plan Note (Signed)
Some weakness as outlined.  More unsteady.  Recent falls.  Will arrange home health physical therapy.

## 2018-02-12 NOTE — Assessment & Plan Note (Signed)
Discussed with pt and daughter today.  She does not desire to be resuscitated.  (no chest compressions, no ventilator, etc).  DNR form completed.

## 2018-02-12 NOTE — Assessment & Plan Note (Signed)
Recent falls, unsteady gait and weakness as outlined.  Discussed using her walker.  Also arrange physical therapy through home health.

## 2018-02-12 NOTE — Assessment & Plan Note (Signed)
Blood pressure under good control.  Continue same medication regimen.  Follow pressures.  Follow metabolic panel.   

## 2018-02-12 NOTE — Assessment & Plan Note (Signed)
Recent hgb checked in ER.  Stable.  Follow cbc.

## 2018-02-12 NOTE — Assessment & Plan Note (Signed)
No evidence of fluid in lungs.  Has not been wearing her compression hose.  Recommend compression hose, leg elevation and monitor increased sodium intake.  Follow.

## 2018-02-12 NOTE — Assessment & Plan Note (Signed)
Has been followed by Dr Kernodle.  Stable.  

## 2018-02-13 ENCOUNTER — Telehealth: Payer: Self-pay

## 2018-02-13 NOTE — Telephone Encounter (Signed)
Patients daughter thought she had lost the DNR but she found it, disregard msg.

## 2018-02-13 NOTE — Telephone Encounter (Signed)
Copied from [place] [phone]. Topic: General - Other >> Feb 13, 2018  9:29 AM Yvette Rack wrote: Reason for CRM: pt daughter Lib calling stating that she doen't have the form that a  do not desire to be resuscitated form and that she would like a copy of it call [name] "Lib" at [phone] she will come into [place] today around 11:00  >> Feb 13, 2018 10:16 AM Cecelia Byars, NT wrote: Patients daughter called and said she has located the DNR paper work and does not need another one please disregard previous request.

## 2018-02-13 NOTE — Telephone Encounter (Signed)
FYI

## 2018-02-13 NOTE — Telephone Encounter (Signed)
Copied from [place] [id number]. Topic: General - Other >> Feb 13, 2018  9:29 AM Yvette Rack wrote: Reason for CRM: pt daughter Lib calling stating that she doen't have the form that a  do not desire to be resuscitated form and that she would like a copy of it call [name] "Lib" at [phone] she will come into [place] today around 11:00

## 2018-02-14 DIAGNOSIS — R0602 Shortness of breath: Secondary | ICD-10-CM | POA: Diagnosis not present

## 2018-02-14 DIAGNOSIS — I10 Essential (primary) hypertension: Secondary | ICD-10-CM | POA: Diagnosis not present

## 2018-02-14 DIAGNOSIS — K219 Gastro-esophageal reflux disease without esophagitis: Secondary | ICD-10-CM | POA: Diagnosis not present

## 2018-02-14 DIAGNOSIS — R600 Localized edema: Secondary | ICD-10-CM | POA: Diagnosis not present

## 2018-02-14 DIAGNOSIS — R350 Frequency of micturition: Secondary | ICD-10-CM | POA: Diagnosis not present

## 2018-02-14 DIAGNOSIS — I340 Nonrheumatic mitral (valve) insufficiency: Secondary | ICD-10-CM | POA: Diagnosis not present

## 2018-02-14 DIAGNOSIS — M1990 Unspecified osteoarthritis, unspecified site: Secondary | ICD-10-CM | POA: Diagnosis not present

## 2018-02-14 DIAGNOSIS — R0789 Other chest pain: Secondary | ICD-10-CM | POA: Diagnosis not present

## 2018-02-15 DIAGNOSIS — M810 Age-related osteoporosis without current pathological fracture: Secondary | ICD-10-CM | POA: Diagnosis not present

## 2018-02-15 DIAGNOSIS — M064 Inflammatory polyarthropathy: Secondary | ICD-10-CM | POA: Diagnosis not present

## 2018-02-18 ENCOUNTER — Telehealth: Payer: Self-pay | Admitting: Internal Medicine

## 2018-02-18 NOTE — Telephone Encounter (Signed)
Verbal orders per Dr. Nicki Reaper given for PT x 2 weeks for four weeks orders given to Johnnye Sima at [place].

## 2018-02-18 NOTE — Telephone Encounter (Signed)
Copied from [place] [id number]. Topic: General - Other >> Feb 18, 2018  8:25 AM Margot Ables wrote: Reason for CRM: Pt was seen Friday 02/15/18.  Requesting VO for PT 2 x week for 4 weeks for general strengthening, gait training, and balance training. Kenney Houseman has secure voicemail, ok to leave detailed message.

## 2018-02-19 DIAGNOSIS — M064 Inflammatory polyarthropathy: Secondary | ICD-10-CM | POA: Diagnosis not present

## 2018-02-19 DIAGNOSIS — M810 Age-related osteoporosis without current pathological fracture: Secondary | ICD-10-CM | POA: Diagnosis not present

## 2018-02-21 DIAGNOSIS — M810 Age-related osteoporosis without current pathological fracture: Secondary | ICD-10-CM | POA: Diagnosis not present

## 2018-02-21 DIAGNOSIS — M064 Inflammatory polyarthropathy: Secondary | ICD-10-CM | POA: Diagnosis not present

## 2018-02-25 DIAGNOSIS — M064 Inflammatory polyarthropathy: Secondary | ICD-10-CM | POA: Diagnosis not present

## 2018-02-25 DIAGNOSIS — M810 Age-related osteoporosis without current pathological fracture: Secondary | ICD-10-CM | POA: Diagnosis not present

## 2018-03-01 DIAGNOSIS — M064 Inflammatory polyarthropathy: Secondary | ICD-10-CM | POA: Diagnosis not present

## 2018-03-01 DIAGNOSIS — M810 Age-related osteoporosis without current pathological fracture: Secondary | ICD-10-CM | POA: Diagnosis not present

## 2018-03-06 DIAGNOSIS — M810 Age-related osteoporosis without current pathological fracture: Secondary | ICD-10-CM | POA: Diagnosis not present

## 2018-03-06 DIAGNOSIS — M064 Inflammatory polyarthropathy: Secondary | ICD-10-CM | POA: Diagnosis not present

## 2018-03-08 DIAGNOSIS — M064 Inflammatory polyarthropathy: Secondary | ICD-10-CM | POA: Diagnosis not present

## 2018-03-08 DIAGNOSIS — M810 Age-related osteoporosis without current pathological fracture: Secondary | ICD-10-CM | POA: Diagnosis not present

## 2018-03-08 NOTE — Telephone Encounter (Signed)
LMTCB.  Need more information

## 2018-03-08 NOTE — Telephone Encounter (Signed)
Please advise 

## 2018-03-08 NOTE — Telephone Encounter (Signed)
Pt's daughter returned Trisha's call, contact her back when possible

## 2018-03-08 NOTE — Telephone Encounter (Signed)
Heidi [name] called to inform doctor that over the last few weeks patient has been going to bathroom more frequently at night and getting weaker.  Cardiologist did change her BP a few weeks ago to Losartan.  Please advise.  CB# [phone].

## 2018-03-11 ENCOUNTER — Other Ambulatory Visit: Payer: Self-pay

## 2018-03-11 ENCOUNTER — Other Ambulatory Visit: Payer: Self-pay | Admitting: Internal Medicine

## 2018-03-11 DIAGNOSIS — M810 Age-related osteoporosis without current pathological fracture: Secondary | ICD-10-CM | POA: Diagnosis not present

## 2018-03-11 DIAGNOSIS — M064 Inflammatory polyarthropathy: Secondary | ICD-10-CM | POA: Diagnosis not present

## 2018-03-11 NOTE — Telephone Encounter (Signed)
Patients daughter stated that cardiology changed her BP medication to Losartan 50 mg daily. Since starting patient has been having urinary frequency and getting weaker. Patients daughter stated some confusion. Patient is not complaining of anything. Does patient need appt with you or should they call cardiology?

## 2018-03-11 NOTE — Telephone Encounter (Signed)
Patient daughter called and states you can call her on the home phone CB# [phone]

## 2018-03-11 NOTE — Telephone Encounter (Signed)
Patient's daughter called back.

## 2018-03-11 NOTE — Telephone Encounter (Signed)
Pt scheduled for tomorrow with Almyra Free

## 2018-03-11 NOTE — Telephone Encounter (Signed)
Left message for daughter to return call. 

## 2018-03-11 NOTE — Telephone Encounter (Signed)
If having increased urinary frequency, I am not sure is related to losartan.  Needs evaluation to make sure no infection.  Also, need to know if they have checked blood pressure with change in bp medication.  May need lower dose of losartan.  See if can bring in tomorrow for evaluation.

## 2018-03-12 ENCOUNTER — Encounter: Payer: Self-pay | Admitting: Family Medicine

## 2018-03-12 ENCOUNTER — Ambulatory Visit (INDEPENDENT_AMBULATORY_CARE_PROVIDER_SITE_OTHER): Payer: Medicare Other | Admitting: Family Medicine

## 2018-03-12 DIAGNOSIS — M47892 Other spondylosis, cervical region: Secondary | ICD-10-CM | POA: Diagnosis not present

## 2018-03-12 DIAGNOSIS — M064 Inflammatory polyarthropathy: Secondary | ICD-10-CM | POA: Diagnosis not present

## 2018-03-12 DIAGNOSIS — I10 Essential (primary) hypertension: Secondary | ICD-10-CM | POA: Diagnosis not present

## 2018-03-12 DIAGNOSIS — R350 Frequency of micturition: Secondary | ICD-10-CM | POA: Diagnosis not present

## 2018-03-12 DIAGNOSIS — R296 Repeated falls: Secondary | ICD-10-CM

## 2018-03-12 DIAGNOSIS — Z96652 Presence of left artificial knee joint: Secondary | ICD-10-CM

## 2018-03-12 DIAGNOSIS — Z9181 History of falling: Secondary | ICD-10-CM

## 2018-03-12 DIAGNOSIS — M19041 Primary osteoarthritis, right hand: Secondary | ICD-10-CM | POA: Diagnosis not present

## 2018-03-12 DIAGNOSIS — M19012 Primary osteoarthritis, left shoulder: Secondary | ICD-10-CM | POA: Diagnosis not present

## 2018-03-12 DIAGNOSIS — M419 Scoliosis, unspecified: Secondary | ICD-10-CM | POA: Diagnosis not present

## 2018-03-12 DIAGNOSIS — M19011 Primary osteoarthritis, right shoulder: Secondary | ICD-10-CM | POA: Diagnosis not present

## 2018-03-12 DIAGNOSIS — D649 Anemia, unspecified: Secondary | ICD-10-CM | POA: Diagnosis not present

## 2018-03-12 DIAGNOSIS — K219 Gastro-esophageal reflux disease without esophagitis: Secondary | ICD-10-CM

## 2018-03-12 DIAGNOSIS — M19042 Primary osteoarthritis, left hand: Secondary | ICD-10-CM | POA: Diagnosis not present

## 2018-03-12 DIAGNOSIS — M810 Age-related osteoporosis without current pathological fracture: Secondary | ICD-10-CM | POA: Diagnosis not present

## 2018-03-12 DIAGNOSIS — J45909 Unspecified asthma, uncomplicated: Secondary | ICD-10-CM | POA: Diagnosis not present

## 2018-03-12 DIAGNOSIS — Z7982 Long term (current) use of aspirin: Secondary | ICD-10-CM

## 2018-03-12 DIAGNOSIS — E559 Vitamin D deficiency, unspecified: Secondary | ICD-10-CM | POA: Diagnosis not present

## 2018-03-12 LAB — POC URINALSYSI DIPSTICK (AUTOMATED)
Bilirubin, UA: NEGATIVE
Glucose, UA: NEGATIVE
Ketones, UA: 5
LEUKOCYTES UA: NEGATIVE
NITRITE UA: NEGATIVE
PH UA: 6 (ref 5.0–8.0)
PROTEIN UA: POSITIVE — AB
RBC UA: NEGATIVE
Spec Grav, UA: 1.025 (ref 1.010–1.025)
Urobilinogen, UA: 1 E.U./dL

## 2018-03-12 NOTE — Progress Notes (Signed)
Patient ID: KEARIE MENNEN, female   DOB: June 27, 1918, 84 y.o.   MRN: [id number]    PCP: Einar Pheasant, MD  Subjective:  MAIRYN LENAHAN is a 84 y.o. year old very pleasant female patient who presents with Urinary Tract symptoms: symptoms including frequency, and urgency. She is accompanied by her daughter and son who state that urinary frequency has been consistent and is not worsening. They are seeking care today at the request of the home health nurse as frequency has been noted. Associated decrease in appetite which is not a new finding for patient. She does report eating 5 cookies/day. She drinks "little water" daily. -started: 3 to 4 weeks ago, symptoms are not worsening and this may have been present for a longer period of time. Patient is unable to recall  -previous treatments: None at this time.  -  ROS-denies fever, chills, sweats, N/V, flank pain, or blood in urine  Pertinent Past Medical History- HTN, GERD, unsteady gait  Medications- reviewed  Current Outpatient Medications  Medication Sig Dispense Refill  . acetaminophen (TYLENOL) 650 MG CR tablet Take 1,300 mg by mouth 2 (two) times daily.     Marland Kitchen amLODipine (NORVASC) 5 MG tablet Take 1 tablet (5 mg total) by mouth daily. 90 tablet 3  . aspirin EC 81 MG tablet Take 81 mg by mouth daily.    . B Complex-C (B-COMPLEX WITH VITAMIN C) tablet Take 1 tablet by mouth daily.    . Calcium Carbonate-Vitamin D (CALCIUM 600+D) 600-200 MG-UNIT TABS Take 1 tablet by mouth 2 (two) times daily.    . Cholecalciferol (VITAMIN D) 2000 UNITS tablet Take 2,000 Units by mouth daily.    Marland Kitchen losartan (COZAAR) 50 MG tablet   3  . metoprolol succinate (TOPROL-XL) 25 MG 24 hr tablet TAKE (1/2) TABLET BY MOUTH ONCE DAILY. 45 tablet 0  . montelukast (SINGULAIR) 10 MG tablet TAKE 1 TABLET BY MOUTH ONCE DAILY. 30 tablet 0  . vitamin C (ASCORBIC ACID) 500 MG tablet Take 500 mg by mouth daily.    . vitamin E 400 UNIT capsule Take 400 Units by mouth  daily.     Current Facility-Administered Medications  Medication Dose Route Frequency Provider Last Rate Last Dose  . pneumococcal 13-valent conjugate vaccine (PREVNAR 13) injection 0.5 mL  0.5 mL Intramuscular Tomorrow-1000 Einar Pheasant, MD        Objective: BP 130/86 (BP Location: Left Arm, Patient Position: Sitting, Cuff Size: Normal)   Pulse 72   Temp 98.2 F (36.8 C) (Oral)   Resp 15   Wt 100 lb (45.4 kg)   SpO2 95%   BMI 16.64 kg/m  Gen: NAD, resting comfortably. HEENT: oropharynx is clear and moist CV: RRR no murmurs rubs or gallops Lungs: CTAB no crackles, wheeze, rhonchi Abdomen: soft/nontender/nondistended/normal bowel sounds. No rebound or guarding.  No CVA tenderness.   Suprapubic tenderness not present  Ext: no edema Skin: warm, dry, no rash Neuro: grossly normal, moves all extremities  Assessment/Plan: 1. Urinary frequency - POCT Urinalysis Dipstick (Automated) - Urine Culture; Future  UA is negative for Leukocytes, nitrites, and blood. Protein present.  No symptoms are present at this time. Will send for culture.  Urinary frequency unchanged per daughter and son. Daughter and son state that this visit was for the purpose of checking patient's urine as she is followed by a home health nurse and the nurse suggested to check urine with frequency symptom.  Encouaged hydration and and small frequent meals to promote  healthy eating instead of substituting a variety of foods with cookies.  Also, advised follow up with cardiology as recommended as patient has been recently started on Losartan per daughter. BP is well controlled today.  Follow up with PCP as recommended. Close return precautions provided.  Finally, we reviewed reasons to return to care including if symptoms worsen or persist or new concerns arise- once again particularly fever, N/V, or flank pain.  Laurita Quint, FNP

## 2018-03-12 NOTE — Patient Instructions (Addendum)
Please drink plenty of water, enough to keep urine pale yellow or clear.   If symptom do not improve or worsen, follow up with your provider for further evaluation and treatment.  Follow up with cardiology as discussed.   Urinary Frequency, Adult Urinary frequency means urinating more often than usual. People with urinary frequency urinate at least 8 times in 24 hours, even if they drink a normal amount of fluid. Although they urinate more often than normal, the total amount of urine produced in a day may be normal. Urinary frequency is also called pollakiuria. What are the causes? This condition may be caused by:  A urinary tract infection.  Obesity.  Bladder problems, such as bladder stones.  Caffeine or alcohol.  Eating food or drinking fluids that irritate the bladder. These include coffee, tea, soda, artificial sweeteners, citrus, tomato-based foods, and chocolate.  Certain medicines, such as medicines that help the body get rid of extra fluid (diuretics).  Muscle or nerve weakness.  Overactive bladder.  Chronic diabetes.  Interstitial cystitis.  In men, problems with the prostate, such as an enlarged prostate.  In women, pregnancy.  In some cases, the cause may not be known. What increases the risk? This condition is more likely to develop in:  Women who have gone through menopause.  Men with prostate problems.  People with a disease or injury that affects the nerves or spinal cord.  People who have or have had a condition that affects the brain, such as a stroke.  What are the signs or symptoms? Symptoms of this condition include:  Feeling an urgent need to urinate often. The stress and anxiety of needing to find a bathroom quickly can make this urge worse.  Urinating 8 or more times in 24 hours.  Urinating as often as every 1 to 2 hours.  How is this diagnosed? This condition is diagnosed based on your symptoms, your medical history, and a physical  exam. You may have tests, such as:  Blood tests.  Urine tests.  Imaging tests, such as X-rays or ultrasounds.  A bladder test.  A test of your neurological system. This is the body system that senses the need to urinate.  A test to check for problems in the urethra and bladder called cystoscopy.  You may also be asked to keep a bladder diary. A bladder diary is a record of what you eat and drink, how often you urinate, and how much you urinate. You may need to see a health care provider who specializes in conditions of the urinary tract (urologist) or kidneys (nephrologist). How is this treated? Treatment for this condition depends on the cause. Sometimes the condition goes away on its own and treatment is not necessary. If treatment is needed, it may include:  Taking medicine.  Learning exercises that strengthen the muscles that help control urination.  Following a bladder training program. This may include: ? Learning to delay going to the bathroom. ? Double urinating (voiding). This helps if you are not completely emptying your bladder. ? Scheduled voiding.  Making diet changes, such as: ? Avoiding caffeine. ? Drinking fewer fluids, especially alcohol. ? Not drinking in the evening. ? Not having foods or drinks that may irritate the bladder. ? Eating foods that help prevent or ease constipation. Constipation can make this condition worse.  Having the nerves in your bladder stimulated. There are two options for stimulating the nerves to your bladder: ? Outpatient electrical nerve stimulation. This is done by your  health care provider. ? Surgery to implant a bladder pacemaker. The pacemaker helps to control the urge to urinate.  Follow these instructions at home:  Keep a bladder diary if told to by your health care provider.  Take over-the-counter and prescription medicines only as told by your health care provider.  Do any exercises as told by your health care  provider.  Follow a bladder training program as told by your health care provider.  Make any recommended diet changes.  Keep all follow-up visits as told by your health care provider. This is important. Contact a health care provider if:  You start urinating more often.  You feel pain or irritation when you urinate.  You notice blood in your urine.  Your urine looks cloudy.  You develop a fever.  You begin vomiting. Get help right away if:  You are unable to urinate. This information is not intended to replace advice given to you by your health care provider. Make sure you discuss any questions you have with your health care provider. Document Released: 06/24/2009 Document Revised: 09/29/2015 Document Reviewed: 03/24/2015 Elsevier Interactive Patient Education  [employer].

## 2018-03-13 DIAGNOSIS — M810 Age-related osteoporosis without current pathological fracture: Secondary | ICD-10-CM | POA: Diagnosis not present

## 2018-03-13 DIAGNOSIS — M064 Inflammatory polyarthropathy: Secondary | ICD-10-CM | POA: Diagnosis not present

## 2018-03-26 ENCOUNTER — Telehealth: Payer: Self-pay

## 2018-03-26 NOTE — Telephone Encounter (Signed)
[place] for patient to stop multivitamin

## 2018-03-26 NOTE — Telephone Encounter (Signed)
Copied from [place] [id number]. Topic: General - Other >> Mar 26, 2018 12:39 PM Judyann Munson wrote: Reason for CRM: Patient daughter [name]) is calling to see if the patient can stop taking the multi vitamins she stated it is getting harder for her to swallow. Please advise

## 2018-03-27 NOTE — Telephone Encounter (Signed)
Ok.  If continues to have issues with swallowing, let us know.

## 2018-03-27 NOTE — Telephone Encounter (Signed)
Left message for patient daughter to call office [facility] may advise .

## 2018-03-28 DIAGNOSIS — S0993XA Unspecified injury of face, initial encounter: Secondary | ICD-10-CM | POA: Diagnosis not present

## 2018-03-28 DIAGNOSIS — W19XXXA Unspecified fall, initial encounter: Secondary | ICD-10-CM | POA: Diagnosis not present

## 2018-03-29 NOTE — Telephone Encounter (Signed)
Called patients daughter to advise that she could stop multivitamin. Patients daughter wanted me to let you know that patient is now under 24 hour care. She has someone staying with her at all times. Patient has fell twice in the last week. Nothing acute noted from fall, patient was evaluated by EMS. Daughter stated that patient is declining, her memory is getting worse, getting weaker. Patient is still eating and drinking at this time and taking medication as prescribed. Confirmed with daughter that there is nothing acute going on, she stated this has been a steady decline and she is almost 72. Advised that I would let you know and call back if needed. Also advised that you are out of the office this week and would return on Monday

## 2018-03-31 NOTE — Telephone Encounter (Signed)
Reviewed note.  Has 24 hour care.  No acute issues currently.  Eating and drinking now.  Notify daughter to let us know if needs anything.

## 2018-04-03 ENCOUNTER — Telehealth: Payer: Self-pay | Admitting: Radiology

## 2018-04-03 NOTE — Telephone Encounter (Signed)
Called daughter.  Left message to try and clarify what happened today.  Had discussed with lab regarding that I was ok with leaving a urine sample - but needed to be in a sterile cup.  Did not mind her dropping off urine, but need in sterile cup.

## 2018-04-03 NOTE — Telephone Encounter (Signed)
Pt daughter came to drop off urine specimen. Urine specimen was not in a sterile container and was in a glass cup. Advised pt daughter that specimen needed to be in sterile container. Pt daughter was very aggravated and angry. Pt daughter stated, "My mom is very sick and I drove an hour up here to drop this off." "Dr. Nicki Reaper told me I could drop off a urine at any time." Apologized to pt daughter for the inconvenience and advised pt daughter again that because specimen was not sterile and there were no orders we could not accept specimen." Pt daughter was very angry and began to yell. Advised pt daughter I would advise Dr. Nicki Reaper. Per Dr. Nicki Reaper specimen needed to be sterile and orders could be placed. Advised pt's daughter of Dr. Bary Leriche recommendations and offered several sterile urine containers. Pt daughter did not take urine containers and walked out of lab. Pt daughter stated, "I will not be coming back here. This is ridiculous."

## 2018-04-03 NOTE — Telephone Encounter (Signed)
Urine cups placed up front for family to pick up

## 2018-04-03 NOTE — Telephone Encounter (Signed)
Pt daughter stated that she no long wanted her mother to be a pt of Dr.Scott if she cant randomly drop off her urine.. Pt daughter was very rude through out our conversation

## 2018-04-03 NOTE — Telephone Encounter (Signed)
Spoke to pts daughter (Lib).  Discussed concerns regarding sending urine in a glass jar.  Explained need for sterile containers.  Explained did not mind her dropping of urine sample, but does need to be in sterile container.  She was ok with this and will have her sister or brother-n-law come pick up cups ([name] or [place]).  I will put order in for urine check.  Discussed continued care for her mother.  She reports she does want to continue to get her care here - by me.  Did just need to cancel her upcoming appt - going to be out of town.   Please pull some urine cups and place in bag for pick up.  Thanks

## 2018-04-03 NOTE — Telephone Encounter (Signed)
FYI

## 2018-04-04 ENCOUNTER — Other Ambulatory Visit: Payer: Medicare Other

## 2018-04-04 DIAGNOSIS — R350 Frequency of micturition: Secondary | ICD-10-CM | POA: Diagnosis not present

## 2018-04-05 ENCOUNTER — Other Ambulatory Visit: Payer: Self-pay | Admitting: Family Medicine

## 2018-04-05 DIAGNOSIS — N3090 Cystitis, unspecified without hematuria: Secondary | ICD-10-CM

## 2018-04-06 ENCOUNTER — Other Ambulatory Visit: Payer: Self-pay | Admitting: Internal Medicine

## 2018-04-06 LAB — URINE CULTURE
MICRO NUMBER: 90881313
SPECIMEN QUALITY: ADEQUATE

## 2018-04-06 MED ORDER — FOSFOMYCIN TROMETHAMINE 3 G PO PACK: 3.0000 g | PACK | Freq: Once | ORAL | 0 refills | Status: AC

## 2018-04-06 NOTE — Progress Notes (Signed)
rx sent in for monurol sachet x 1.

## 2018-04-08 ENCOUNTER — Other Ambulatory Visit: Payer: Self-pay | Admitting: Internal Medicine

## 2018-04-08 ENCOUNTER — Inpatient Hospital Stay: Payer: Medicare Other | Admitting: Internal Medicine

## 2018-04-08 MED ORDER — METOPROLOL SUCCINATE ER 25 MG PO TB24: ORAL_TABLET | ORAL | 0 refills | Status: DC

## 2018-04-08 MED ORDER — MONTELUKAST SODIUM 10 MG PO TABS: 10.0000 mg | ORAL_TABLET | Freq: Every day | ORAL | 0 refills | Status: DC

## 2018-04-08 NOTE — Telephone Encounter (Signed)
Copied from [place] [id number]. Topic: Quick Communication - Rx Refill/Question >> Apr 08, 2018  8:40 AM Margot Ables wrote: Medication: singulair & metoprolol - pt is out of both - advised 1-3 business days for refills - states she cannot wait 3 days and needs today - advised to call 5 days ahead Has the patient contacted their pharmacy? Yes - advised they will fax request [facility] (with phone number or street name): [place], [place] [phone] (Phone) [phone] (Fax)

## 2018-04-17 ENCOUNTER — Ambulatory Visit: Payer: Self-pay | Admitting: *Deleted

## 2018-04-17 NOTE — Telephone Encounter (Signed)
Patient has been scheduled for an appointment.

## 2018-04-17 NOTE — Telephone Encounter (Signed)
Pt's son called with his mom getting up frequently during the night to urinate and only going small amounts. She had been treated in July for a UTI and he thinks she has not gotten over it. He denies fever, abd pain or flank pain. The caregiver stated that she does have a vaginal discharge.  Appointment scheduled per protocol.  Advised to call 911 for symptoms of pain, blood in urine or fever.  Caregiver and son voiced understanding. Will route to flow at [facility].   Reason for Disposition . Urinating more frequently than usual (i.e., frequency)  Answer Assessment - Initial Assessment Questions 1. SYMPTOM: "What's the main symptom you're concerned about?" (e.g., frequency, incontinence)     Disorientation at night and frequency 2. ONSET: "When did the  frequency  start?"     More than a week 3. PAIN: "Is there any pain?" If so, ask: "How bad is it?" (Scale: 1-10; mild, moderate, severe)     No pain 4. CAUSE: "What do you think is causing the symptoms?"     UTI 5. OTHER SYMPTOMS: "Do you have any other symptoms?" (e.g., fever, flank pain, blood in urine, pain with urination)     Frequency and urgency  Protocols used: URINARY [facility]

## 2018-04-18 ENCOUNTER — Ambulatory Visit (INDEPENDENT_AMBULATORY_CARE_PROVIDER_SITE_OTHER): Payer: Medicare Other | Admitting: Family Medicine

## 2018-04-18 ENCOUNTER — Encounter: Payer: Self-pay | Admitting: Family Medicine

## 2018-04-18 VITALS — BP 142/64 | HR 83 | Temp 97.6°F | Resp 16 | Ht 60.0 in | Wt 95.4 lb

## 2018-04-18 DIAGNOSIS — R3915 Urgency of urination: Secondary | ICD-10-CM

## 2018-04-18 DIAGNOSIS — R358 Other polyuria: Secondary | ICD-10-CM

## 2018-04-18 DIAGNOSIS — N390 Urinary tract infection, site not specified: Secondary | ICD-10-CM

## 2018-04-18 DIAGNOSIS — R3589 Other polyuria: Secondary | ICD-10-CM

## 2018-04-18 DIAGNOSIS — R634 Abnormal weight loss: Secondary | ICD-10-CM

## 2018-04-18 LAB — POCT URINALYSIS DIPSTICK
BILIRUBIN UA: NEGATIVE
Clarity, UA: NEGATIVE
GLUCOSE UA: NEGATIVE
Ketones, UA: 5
Leukocytes, UA: NEGATIVE
Nitrite, UA: POSITIVE
Protein, UA: POSITIVE — AB
RBC UA: NEGATIVE
SPEC GRAV UA: 1.025 (ref 1.010–1.025)
Urobilinogen, UA: 1 E.U./dL
pH, UA: 6 (ref 5.0–8.0)

## 2018-04-18 MED ORDER — FOSFOMYCIN TROMETHAMINE 3 G PO PACK: 3.0000 g | PACK | Freq: Once | ORAL | 0 refills | Status: AC

## 2018-04-18 NOTE — Patient Instructions (Addendum)
Great to meet you!  You have lost 5 pounds since last visit and we do not want to you to lose any more weight.    At this point in life - I would not restrict foods. Anything she will eat, I would allow to be sure she is getting calories in  Tips to increase calories: Add ice cream, peanut butter and scoop of protein powder to ensure shake. You can also do small cups of protein drinks throughout the day. You can drink more than 1 ensure per day.  If you want an ice cream sandwiches or butter biscuits - eat it!

## 2018-04-18 NOTE — Progress Notes (Signed)
Subjective:    Patient ID: Heidi [name], female    DOB: 07/14/1918, 84 y.o.   MRN: [id number]  HPI  Presents to clinic with continued urinary frequency symptoms. Urine culture was done 04/06/18, grew proteus mirabilus and she was treated with one time 3g packet of Monurol. Family states she continues to say every hour throughout the night that she needs to get up and use the bathroom.   Patient also has lost 5 pounds since last weight check.  Family member state patient does not eat much.  She will drink 1 Ensure in the morning and pick at food throughout the day.  Patient states "I want to eat ice cream sandwiches and butter biscuits my family is not sure if I am allowed to".  Patient Active Problem List   Diagnosis Date Noted  . DNR (do not resuscitate) discussion 02/12/2018  . Unsteady gait 02/12/2018  . Weakness 11/19/2016  . Skin lesion of cheek 04/10/2015  . Gas 04/10/2015  . Loss of weight 04/08/2015  . Rib pain on right side 08/16/2014  . Shoulder pain 08/16/2014  . Lower extremity edema 01/20/2014  . Leg pain 01/20/2014  . Rash 09/12/2013  . Urinary frequency 09/12/2013  . Headache 02/19/2013  . Dizziness 02/19/2013  . Inflammatory polyarthropathy ([place]) 08/16/2012  . Osteoarthritis 08/16/2012  . Osteoporosis 08/16/2012  . Anemia 08/16/2012  . GERD (gastroesophageal reflux disease) 08/16/2012  . Hypertension 08/16/2012   Social History   Tobacco Use  . Smoking status: Never Smoker  . Smokeless tobacco: Never Used  Substance Use Topics  . Alcohol use: No    Alcohol/week: 0.0 standard drinks   Review of Systems  Constitutional: Negative for chills, diaphoresis and fever.  HENT: Negative.   Eyes: Negative.   Respiratory: Negative for cough, shortness of breath and wheezing.   Cardiovascular: Negative for chest pain, palpitations and leg swelling.  Gastrointestinal: Negative for diarrhea, nausea and vomiting.       Lower ABD pressure  Genitourinary:  Positive for dysuria, frequency and urgency.  Skin: Negative for pallor and rash.  Neurological: Negative for syncope, light-headedness and headaches.      Objective:   Physical Exam  Constitutional: She is oriented to person, place, and time. No distress.  Thin  HENT:  Head: Normocephalic and atraumatic.  Eyes: EOM are normal. No scleral icterus.  Neck: Neck supple. No tracheal deviation present.  Cardiovascular: Normal rate and regular rhythm.  Pulmonary/Chest: Effort normal and breath sounds normal. No respiratory distress.  Abdominal: Soft. Bowel sounds are normal. There is tenderness. There is no guarding.  Mild suprapubic tenderness  Musculoskeletal:  Kyphosis of spine. Walks slowly with granddaughter beside her.   Neurological: She is alert and oriented to person, place, and time.  Skin: Skin is warm and dry. She is not diaphoretic. No pallor.  Psychiatric: She has a normal mood and affect. Her behavior is normal. Thought content normal.  Nursing note and vitals reviewed.   Vitals:   04/18/18 0825  BP: (!) 142/64  Pulse: 83  Resp: 16  Temp: 97.6 F (36.4 C)  SpO2: 95%      Filed Weights   04/18/18 0825  Weight: 95 lb 6 oz (43.3 kg)    Assessment & Plan:    A total of 25 minutes were spent face-to-face with the patient during this encounter and over half of that time was spent on counseling and coordination of care. The patient was counseled on foods she can eat,  how to increase calories, urinary frequency.  Chronic urinary frequency -chronic urinary frequency could be related to age and/or fallen bladder. Keep up good water intake.  Recurrent UTI - UA is +nitrites so we will send for culture and do another dose of Monurol 3g (patient has many antibiotic allergies).   Weight loss - You have lost 5 pounds since last visit and we do not want to you to lose any more weight. I would prefer you gain weight.  At this point in life - I would not restrict foods.  Anything she will eat, I would allow to be sure she is getting calories in Tips to increase calories: Add ice cream, peanut butter and scoop of protein powder to ensure shake. You can also do small cups of protein drinks throughout the day. You can drink more than 1 ensure per day.  If you want an ice cream sandwiches or butter biscuits, it is ok to eat them.   Keep appt in Jan 2020 as scheduled and return to office sooner if needed.

## 2018-04-20 LAB — URINE CULTURE
MICRO NUMBER: 90940079
SPECIMEN QUALITY: ADEQUATE

## 2018-04-24 ENCOUNTER — Telehealth: Payer: Self-pay

## 2018-04-24 NOTE — Telephone Encounter (Signed)
Copied from [place] [id number]. Topic: Inquiry >> Apr 24, 2018  9:04 AM Conception Chancy, NT wrote: Reason for CRM: [name] the patient daughter is calling and would like to know if Dr. Nicki Reaper can prescribe a small dose of medication that can calm her down because late in the afternoons, after lunch, the patient is getting agitated and mean.  Daughter can be reached at [phone]

## 2018-04-24 NOTE — Telephone Encounter (Signed)
Is there something we can send in for her? I do not see any allergies listed.

## 2018-04-24 NOTE — Telephone Encounter (Signed)
Need specifics about how she is acting.  Does she seem anxious?

## 2018-04-25 NOTE — Telephone Encounter (Signed)
Patient is getting very agitated and will hit, bite, curse and spit at them. She will try to get up and roam around and gets mad when her daughter tries to stop her. She starts acting like this around 2-3 pm everyday. She stays very agitated and becomes more anxious. Not sleeping well. Her appetite is ok, sometimes she does not want to eat at all.

## 2018-04-26 ENCOUNTER — Other Ambulatory Visit: Payer: Self-pay | Admitting: Internal Medicine

## 2018-04-26 MED ORDER — MIRTAZAPINE 7.5 MG PO TABS: 7.5000 mg | ORAL_TABLET | Freq: Every day | ORAL | 1 refills | Status: DC

## 2018-04-26 NOTE — Telephone Encounter (Signed)
Discussed with pts daughter.  Will discuss with psychiatry as well regarding medication choice.  Daughter in agreement.  Also given clinical decline and weight loss, discussed Hospice referral.  She will notify me if decides to proceed with hospice referral.

## 2018-04-26 NOTE — Telephone Encounter (Signed)
Discussed with psychiatry.  Will start remeron 7.5mg  q hs.  Follow.  Call with update.  Lib (pts daughter) notified.

## 2018-04-26 NOTE — Progress Notes (Signed)
rx sent in for remeron 7.5mg #30 with one refill.   

## 2018-04-29 ENCOUNTER — Other Ambulatory Visit: Payer: Self-pay

## 2018-04-29 ENCOUNTER — Emergency Department: Payer: Medicare Other

## 2018-04-29 ENCOUNTER — Inpatient Hospital Stay
Admission: EM | Admit: 2018-04-29 | Discharge: 2018-05-03 | DRG: 551 | Disposition: A | Discharge status: Hospice | Payer: Medicare Other | Attending: Internal Medicine | Admitting: Internal Medicine

## 2018-04-29 DIAGNOSIS — K117 Disturbances of salivary secretion: Secondary | ICD-10-CM

## 2018-04-29 DIAGNOSIS — Z6825 Body mass index (BMI) 25.0-25.9, adult: Secondary | ICD-10-CM

## 2018-04-29 DIAGNOSIS — S12000A Unspecified displaced fracture of first cervical vertebra, initial encounter for closed fracture: Secondary | ICD-10-CM | POA: Diagnosis present

## 2018-04-29 DIAGNOSIS — M542 Cervicalgia: Secondary | ICD-10-CM | POA: Diagnosis not present

## 2018-04-29 DIAGNOSIS — R4182 Altered mental status, unspecified: Secondary | ICD-10-CM | POA: Diagnosis not present

## 2018-04-29 DIAGNOSIS — Z791 Long term (current) use of non-steroidal anti-inflammatories (NSAID): Secondary | ICD-10-CM | POA: Diagnosis not present

## 2018-04-29 DIAGNOSIS — Z515 Encounter for palliative care: Secondary | ICD-10-CM | POA: Diagnosis not present

## 2018-04-29 DIAGNOSIS — Y92009 Unspecified place in unspecified non-institutional (private) residence as the place of occurrence of the external cause: Secondary | ICD-10-CM | POA: Diagnosis not present

## 2018-04-29 DIAGNOSIS — Z96652 Presence of left artificial knee joint: Secondary | ICD-10-CM | POA: Diagnosis present

## 2018-04-29 DIAGNOSIS — E43 Unspecified severe protein-calorie malnutrition: Secondary | ICD-10-CM | POA: Diagnosis present

## 2018-04-29 DIAGNOSIS — Z7189 Other specified counseling: Secondary | ICD-10-CM

## 2018-04-29 DIAGNOSIS — E559 Vitamin D deficiency, unspecified: Secondary | ICD-10-CM | POA: Diagnosis present

## 2018-04-29 DIAGNOSIS — S12040A Displaced lateral mass fracture of first cervical vertebra, initial encounter for closed fracture: Secondary | ICD-10-CM | POA: Diagnosis not present

## 2018-04-29 DIAGNOSIS — F0390 Unspecified dementia without behavioral disturbance: Secondary | ICD-10-CM

## 2018-04-29 DIAGNOSIS — Z8249 Family history of ischemic heart disease and other diseases of the circulatory system: Secondary | ICD-10-CM | POA: Diagnosis not present

## 2018-04-29 DIAGNOSIS — W19XXXA Unspecified fall, initial encounter: Secondary | ICD-10-CM | POA: Diagnosis not present

## 2018-04-29 DIAGNOSIS — F0150 Vascular dementia without behavioral disturbance: Secondary | ICD-10-CM | POA: Diagnosis not present

## 2018-04-29 DIAGNOSIS — Z23 Encounter for immunization: Secondary | ICD-10-CM

## 2018-04-29 DIAGNOSIS — W19XXXD Unspecified fall, subsequent encounter: Secondary | ICD-10-CM | POA: Diagnosis not present

## 2018-04-29 DIAGNOSIS — R000 Tachycardia, unspecified: Secondary | ICD-10-CM | POA: Diagnosis present

## 2018-04-29 DIAGNOSIS — R451 Restlessness and agitation: Secondary | ICD-10-CM | POA: Diagnosis present

## 2018-04-29 DIAGNOSIS — Z8261 Family history of arthritis: Secondary | ICD-10-CM | POA: Diagnosis not present

## 2018-04-29 DIAGNOSIS — I679 Cerebrovascular disease, unspecified: Secondary | ICD-10-CM | POA: Diagnosis not present

## 2018-04-29 DIAGNOSIS — E860 Dehydration: Secondary | ICD-10-CM | POA: Diagnosis present

## 2018-04-29 DIAGNOSIS — I10 Essential (primary) hypertension: Secondary | ICD-10-CM | POA: Diagnosis present

## 2018-04-29 DIAGNOSIS — S0003XA Contusion of scalp, initial encounter: Secondary | ICD-10-CM | POA: Diagnosis not present

## 2018-04-29 DIAGNOSIS — Z9189 Other specified personal risk factors, not elsewhere classified: Secondary | ICD-10-CM | POA: Diagnosis not present

## 2018-04-29 DIAGNOSIS — Z7982 Long term (current) use of aspirin: Secondary | ICD-10-CM | POA: Diagnosis not present

## 2018-04-29 DIAGNOSIS — W1830XA Fall on same level, unspecified, initial encounter: Secondary | ICD-10-CM | POA: Diagnosis present

## 2018-04-29 DIAGNOSIS — S299XXA Unspecified injury of thorax, initial encounter: Secondary | ICD-10-CM | POA: Diagnosis not present

## 2018-04-29 DIAGNOSIS — D649 Anemia, unspecified: Secondary | ICD-10-CM | POA: Diagnosis not present

## 2018-04-29 DIAGNOSIS — S59901A Unspecified injury of right elbow, initial encounter: Secondary | ICD-10-CM | POA: Diagnosis not present

## 2018-04-29 DIAGNOSIS — Z66 Do not resuscitate: Secondary | ICD-10-CM | POA: Diagnosis present

## 2018-04-29 DIAGNOSIS — Z7401 Bed confinement status: Secondary | ICD-10-CM | POA: Diagnosis not present

## 2018-04-29 DIAGNOSIS — M064 Inflammatory polyarthropathy: Secondary | ICD-10-CM | POA: Diagnosis not present

## 2018-04-29 DIAGNOSIS — Z823 Family history of stroke: Secondary | ICD-10-CM | POA: Diagnosis not present

## 2018-04-29 DIAGNOSIS — K219 Gastro-esophageal reflux disease without esophagitis: Secondary | ICD-10-CM | POA: Diagnosis present

## 2018-04-29 DIAGNOSIS — S12040D Displaced lateral mass fracture of first cervical vertebra, subsequent encounter for fracture with routine healing: Secondary | ICD-10-CM | POA: Diagnosis not present

## 2018-04-29 DIAGNOSIS — S199XXA Unspecified injury of neck, initial encounter: Secondary | ICD-10-CM | POA: Diagnosis not present

## 2018-04-29 DIAGNOSIS — M810 Age-related osteoporosis without current pathological fracture: Secondary | ICD-10-CM | POA: Diagnosis present

## 2018-04-29 DIAGNOSIS — Z801 Family history of malignant neoplasm of trachea, bronchus and lung: Secondary | ICD-10-CM

## 2018-04-29 DIAGNOSIS — S0990XA Unspecified injury of head, initial encounter: Secondary | ICD-10-CM | POA: Diagnosis not present

## 2018-04-29 DIAGNOSIS — M1991 Primary osteoarthritis, unspecified site: Secondary | ICD-10-CM | POA: Diagnosis not present

## 2018-04-29 DIAGNOSIS — Z800 Family history of malignant neoplasm of digestive organs: Secondary | ICD-10-CM | POA: Diagnosis not present

## 2018-04-29 DIAGNOSIS — R52 Pain, unspecified: Secondary | ICD-10-CM | POA: Diagnosis not present

## 2018-04-29 DIAGNOSIS — J45909 Unspecified asthma, uncomplicated: Secondary | ICD-10-CM | POA: Diagnosis not present

## 2018-04-29 DIAGNOSIS — Z0389 Encounter for observation for other suspected diseases and conditions ruled out: Secondary | ICD-10-CM | POA: Diagnosis not present

## 2018-04-29 DIAGNOSIS — S0211AA Type I occipital condyle fracture, right side, initial encounter for closed fracture: Secondary | ICD-10-CM | POA: Diagnosis not present

## 2018-04-29 LAB — COMPREHENSIVE METABOLIC PANEL
ALK PHOS: 46 U/L (ref 38–126)
ALT: 15 U/L (ref 0–44)
AST: 24 U/L (ref 15–41)
Albumin: 4.5 g/dL (ref 3.5–5.0)
Anion gap: 9 (ref 5–15)
BUN: 24 mg/dL — ABNORMAL HIGH (ref 8–23)
CALCIUM: 10 mg/dL (ref 8.9–10.3)
CO2: 26 mmol/L (ref 22–32)
CREATININE: 0.85 mg/dL (ref 0.44–1.00)
Chloride: 106 mmol/L (ref 98–111)
GFR calc non Af Amer: 56 mL/min — ABNORMAL LOW (ref >60)
GLUCOSE: 101 mg/dL — AB (ref 70–99)
Potassium: 3.8 mmol/L (ref 3.5–5.1)
SODIUM: 141 mmol/L (ref 135–145)
Total Bilirubin: 0.6 mg/dL (ref 0.3–1.2)
Total Protein: 7.2 g/dL (ref 6.5–8.1)

## 2018-04-29 LAB — CBC WITH DIFFERENTIAL/PLATELET
Basophils Absolute: 0 10*3/uL (ref 0–0.1)
Basophils Relative: 0 %
EOS ABS: 0 10*3/uL (ref 0–0.7)
Eosinophils Relative: 0 %
HCT: 38.1 % (ref 35.0–47.0)
HEMOGLOBIN: 12.8 g/dL (ref 12.0–16.0)
LYMPHS ABS: 1 10*3/uL (ref 1.0–3.6)
LYMPHS PCT: 10 %
MCH: 32.3 pg (ref 26.0–34.0)
MCHC: 33.5 g/dL (ref 32.0–36.0)
MCV: 96.5 fL (ref 80.0–100.0)
Monocytes Absolute: 0.6 10*3/uL (ref 0.2–0.9)
Monocytes Relative: 6 %
NEUTROS ABS: 8.6 10*3/uL — AB (ref 1.4–6.5)
NEUTROS PCT: 84 %
Platelets: 202 10*3/uL (ref 150–440)
RBC: 3.95 MIL/uL (ref 3.80–5.20)
RDW: 13.4 % (ref 11.5–14.5)
WBC: 10.3 10*3/uL (ref 3.6–11.0)

## 2018-04-29 LAB — TROPONIN I: Troponin I: <0.03 ng/mL (ref <0.03)

## 2018-04-29 LAB — URINALYSIS, COMPLETE (UACMP) WITH MICROSCOPIC
Bilirubin Urine: NEGATIVE
Glucose, UA: NEGATIVE mg/dL
Ketones, ur: 20 mg/dL — AB
NITRITE: POSITIVE — AB
PROTEIN: 30 mg/dL — AB
Specific Gravity, Urine: >1.046 — ABNORMAL HIGH (ref 1.005–1.030)
pH: 9 — ABNORMAL HIGH (ref 5.0–8.0)

## 2018-04-29 LAB — PROTIME-INR
INR: 0.92
PROTHROMBIN TIME: 12.3 s (ref 11.4–15.2)

## 2018-04-29 LAB — TYPE AND SCREEN
ABO/RH(D): A POS
Antibody Screen: NEGATIVE

## 2018-04-29 MED ORDER — MORPHINE SULFATE (PF) 2 MG/ML IV SOLN
2.0000 mg | INTRAVENOUS | Status: DC | PRN
  Administered 2018-04-29 – 2018-05-02 (×6): 2 mg via INTRAVENOUS
  Filled 2018-04-29 (×8): qty 1

## 2018-04-29 MED ORDER — ENOXAPARIN SODIUM 40 MG/0.4ML ~~LOC~~ SOLN: 40.0000 mg | SUBCUTANEOUS | Status: DC

## 2018-04-29 MED ORDER — ONDANSETRON HCL 4 MG PO TABS: 4.0000 mg | ORAL_TABLET | Freq: Four times a day (QID) | ORAL | Status: DC | PRN

## 2018-04-29 MED ORDER — HALOPERIDOL LACTATE 5 MG/ML IJ SOLN
2.0000 mg | Freq: Once | INTRAMUSCULAR | Status: AC
  Administered 2018-04-29: 2 mg via INTRAVENOUS
  Filled 2018-04-29: qty 1

## 2018-04-29 MED ORDER — VITAMIN D3 25 MCG (1000 UNIT) PO TABS
2000.0000 [IU] | ORAL_TABLET | Freq: Every day | ORAL | Status: DC
  Filled 2018-04-29 (×2): qty 2

## 2018-04-29 MED ORDER — SENNOSIDES-DOCUSATE SODIUM 8.6-50 MG PO TABS: 1.0000 | ORAL_TABLET | Freq: Every evening | ORAL | Status: DC | PRN

## 2018-04-29 MED ORDER — ACETAMINOPHEN 650 MG RE SUPP
650.0000 mg | Freq: Four times a day (QID) | RECTAL | Status: DC | PRN
  Administered 2018-04-29: 650 mg via RECTAL
  Filled 2018-04-29: qty 1

## 2018-04-29 MED ORDER — TETANUS-DIPHTH-ACELL PERTUSSIS 5-2.5-18.5 LF-MCG/0.5 IM SUSP
0.5000 mL | Freq: Once | INTRAMUSCULAR | Status: AC
  Administered 2018-04-29: 0.5 mL via INTRAMUSCULAR
  Filled 2018-04-29: qty 0.5

## 2018-04-29 MED ORDER — METOPROLOL SUCCINATE ER 25 MG PO TB24
12.5000 mg | ORAL_TABLET | Freq: Every day | ORAL | Status: DC
  Administered 2018-04-30: 12.5 mg via ORAL
  Filled 2018-04-29 (×2): qty 1

## 2018-04-29 MED ORDER — BISACODYL 5 MG PO TBEC: 5.0000 mg | DELAYED_RELEASE_TABLET | Freq: Every day | ORAL | Status: DC | PRN

## 2018-04-29 MED ORDER — MIRTAZAPINE 15 MG PO TABS
7.5000 mg | ORAL_TABLET | Freq: Every day | ORAL | Status: DC
  Administered 2018-05-01: 7.5 mg via ORAL
  Filled 2018-04-29 (×2): qty 1

## 2018-04-29 MED ORDER — ENOXAPARIN SODIUM 30 MG/0.3ML ~~LOC~~ SOLN
30.0000 mg | SUBCUTANEOUS | Status: DC
  Administered 2018-04-29: 30 mg via SUBCUTANEOUS
  Filled 2018-04-29: qty 0.3

## 2018-04-29 MED ORDER — ACETAMINOPHEN 325 MG PO TABS
650.0000 mg | ORAL_TABLET | Freq: Four times a day (QID) | ORAL | Status: DC | PRN
  Administered 2018-04-30: 650 mg via ORAL
  Filled 2018-04-29: qty 2

## 2018-04-29 MED ORDER — LACTATED RINGERS IV SOLN
INTRAVENOUS | Status: AC
  Administered 2018-04-29: 19:00:00 via INTRAVENOUS

## 2018-04-29 MED ORDER — ONDANSETRON HCL 4 MG/2ML IJ SOLN: 4.0000 mg | Freq: Four times a day (QID) | INTRAMUSCULAR | Status: DC | PRN

## 2018-04-29 MED ORDER — AMLODIPINE BESYLATE 5 MG PO TABS
5.0000 mg | ORAL_TABLET | Freq: Every day | ORAL | Status: DC
  Administered 2018-04-30: 5 mg via ORAL
  Filled 2018-04-29 (×2): qty 1

## 2018-04-29 MED ORDER — VITAMIN E 180 MG (400 UNIT) PO CAPS
400.0000 [IU] | ORAL_CAPSULE | Freq: Every day | ORAL | Status: DC
  Filled 2018-04-29 (×2): qty 1

## 2018-04-29 MED ORDER — MONTELUKAST SODIUM 10 MG PO TABS
10.0000 mg | ORAL_TABLET | Freq: Every day | ORAL | Status: DC
  Administered 2018-04-30: 10 mg via ORAL
  Filled 2018-04-29: qty 1

## 2018-04-29 MED ORDER — CALCIUM CARBONATE-VITAMIN D 500-200 MG-UNIT PO TABS
1.0000 | ORAL_TABLET | Freq: Two times a day (BID) | ORAL | Status: DC
  Administered 2018-04-30: 1 via ORAL
  Filled 2018-04-29 (×2): qty 1

## 2018-04-29 MED ORDER — LOSARTAN POTASSIUM 50 MG PO TABS
50.0000 mg | ORAL_TABLET | Freq: Every day | ORAL | Status: DC
  Administered 2018-04-30: 50 mg via ORAL
  Filled 2018-04-29 (×2): qty 1

## 2018-04-29 MED ORDER — ASPIRIN EC 81 MG PO TBEC: 81.0000 mg | DELAYED_RELEASE_TABLET | Freq: Every day | ORAL | Status: DC

## 2018-04-29 MED ORDER — VITAMIN C 500 MG PO TABS
500.0000 mg | ORAL_TABLET | Freq: Every day | ORAL | Status: DC
  Filled 2018-04-29 (×2): qty 1

## 2018-04-29 MED ORDER — IOPAMIDOL (ISOVUE-370) INJECTION 76%
75.0000 mL | Freq: Once | INTRAVENOUS | Status: AC | PRN
  Administered 2018-04-29: 75 mL via INTRAVENOUS

## 2018-04-29 NOTE — ED Notes (Signed)
Patient transported to CT 

## 2018-04-29 NOTE — ED Triage Notes (Signed)
Ems gave toradol 30mg  and fentanyl 53mcg.

## 2018-04-29 NOTE — Progress Notes (Signed)
Anticoagulation monitoring(Lovenox):  84yo  female ordered Lovenox 40 mg Q24h for DVT prevention.  Filed Weights   04/29/18 0453 04/29/18 1811  Weight: 101 lb (45.8 kg) 132 lb (59.9 kg)   BMI     Lab Results  Component Value Date   CREATININE 0.85 04/29/2018   CREATININE 0.81 01/25/2018   CREATININE 0.85 01/17/2018   Estimated Creatinine Clearance: 30.6 mL/min (by C-G formula based on SCr of 0.85 mg/dL). Hemoglobin & Hematocrit     Component Value Date/Time   HGB 12.8 04/29/2018 0612   HGB 11.2 (L) 08/09/2014 0324   HCT 38.1 04/29/2018 0612   HCT 34.3 (L) 08/09/2014 0324     Per Protocol for Patient with estCrcl < 30 ml/min and BMI < 40, will transition to Lovenox 30 mg Q24h.     Paulina Fusi, PharmD, BCPS 04/29/2018 6:26 PM

## 2018-04-29 NOTE — ED Notes (Signed)
Pt is becoming agitated, trying to get out of bed. Family worried. Dr. Manuella Ghazi contacted via phone and this nurse asked for orders. Dr. Manuella Ghazi gave verbal order for 2 mg Haldol IV as she had that this morning with good results.

## 2018-04-29 NOTE — ED Notes (Signed)
Pure wick placed by lydia, [facility] for urinary comfort.

## 2018-04-29 NOTE — ED Triage Notes (Signed)
Pt from home with fall. Pt with neck pain, headache, rigid c collar applied on arrival. Pt with skin tears noted to right arm.

## 2018-04-29 NOTE — ED Notes (Signed)
Report to [place], rn.

## 2018-04-29 NOTE — ED Provider Notes (Signed)
[facility] Emergency Department Provider Note  ____________________________________________   First MD Initiated Contact with Patient 04/29/18 0451     (approximate)  I have reviewed the triage vital signs and the nursing notes.   HISTORY  Chief Complaint Fall and Neck Pain  Level 5 exemption history limited by the patient's clinical condition  HPI Heidi [name] is a 84 y.o. female who comes to the emergency department via EMS after sustaining a fall at home.  She reported upper chest pain, headache, neck pain.  EMS gave the patient 30 mg of IV Toradol which did not help with her pain is subsequently gave her 25 mcg of IV fentanyl which did seem to help.  The patient herself has a history of dementia and is currently unable to provide any meaningful history.    Past Medical History:  Diagnosis Date  . Anemia   . Asthma   . GERD (gastroesophageal reflux disease)   . Hypertension   . Inflammatory arthritis    elevated ESR, negative temporal artery bx, low titer rheumatoid factor  . Nephrolithiasis   . OA (osteoarthritis)    Left knee replacement, hands, shoulders, cervial spine  . Osteoporosis    vitamin D deficiency, nasal miacalcin, reclast, previous rib fracture  . Pancreatitis    s/p cholecystectomy with ERCP and stone extraction  . Vitamin D deficiency     Patient Active Problem List   Diagnosis Date Noted  . Fall from ground level 04/29/2018  . DNR (do not resuscitate) discussion 02/12/2018  . Unsteady gait 02/12/2018  . Weakness 11/19/2016  . Skin lesion of cheek 04/10/2015  . Gas 04/10/2015  . Loss of weight 04/08/2015  . Rib pain on right side 08/16/2014  . Shoulder pain 08/16/2014  . Lower extremity edema 01/20/2014  . Leg pain 01/20/2014  . Rash 09/12/2013  . Urinary frequency 09/12/2013  . Headache 02/19/2013  . Dizziness 02/19/2013  . Inflammatory polyarthropathy ([place]) 08/16/2012  . Osteoarthritis 08/16/2012  .  Osteoporosis 08/16/2012  . Anemia 08/16/2012  . GERD (gastroesophageal reflux disease) 08/16/2012  . Hypertension 08/16/2012    Past Surgical History:  Procedure Laterality Date  . ABDOMINAL HYSTERECTOMY     secondary  to bleeding  . LITHOTRIPSY    . REPLACEMENT TOTAL KNEE     Left knee replacement    Prior to Admission medications   Medication Sig Start Date End Date Taking? Authorizing Provider  acetaminophen (TYLENOL) 650 MG CR tablet Take 1,300 mg by mouth 2 (two) times daily.    Yes [provider]  amLODipine (NORVASC) 5 MG tablet Take 1 tablet (5 mg total) by mouth daily. 03/21/17  Yes Einar Pheasant, MD  aspirin EC 81 MG tablet Take 81 mg by mouth daily.   Yes [provider]  B Complex-C (B-COMPLEX WITH VITAMIN C) tablet Take 1 tablet by mouth daily.   Yes [provider]  Calcium Carbonate-Vitamin D (CALCIUM 600+D) 600-200 MG-UNIT TABS Take 1 tablet by mouth 2 (two) times daily.   Yes [provider]  meloxicam (MOBIC) 15 MG tablet Take 15 mg by mouth daily.   Yes [provider]  metoprolol succinate (TOPROL-XL) 25 MG 24 hr tablet TAKE (1/2) TABLET BY MOUTH ONCE DAILY. 04/08/18  Yes Einar Pheasant, MD  mirtazapine (REMERON) 7.5 MG tablet Take 1 tablet (7.5 mg total) by mouth at bedtime. 04/26/18  Yes Einar Pheasant, MD  montelukast (SINGULAIR) 10 MG tablet Take 1 tablet (10 mg total) by  mouth daily. 04/08/18  Yes Einar Pheasant, MD  vitamin C (ASCORBIC ACID) 500 MG tablet Take 500 mg by mouth daily.   Yes [provider]  vitamin E 400 UNIT capsule Take 400 Units by mouth daily.   Yes [provider]  Cholecalciferol (VITAMIN D) 2000 UNITS tablet Take 2,000 Units by mouth daily.    [provider]  losartan (COZAAR) 50 MG tablet  03/11/18   [provider]    Allergies Anhydrous base; Aspirin; Carbapenems; Cephalosporins; Levaquin [levofloxacin in d5w]; Penicillins; Sulfa antibiotics;  Sulfonylureas; Thiazide-type diuretics; Tramadol; Macrobid [nitrofurantoin monohyd macro]; and Omnicef [cefdinir]  Family History  Problem Relation Age of Onset  . Hypertension Mother   . Stroke Mother   . Stomach cancer Father   . Arthritis Brother   . Lung cancer Brother     Social History Social History   Tobacco Use  . Smoking status: Never Smoker  . Smokeless tobacco: Never Used  Substance Use Topics  . Alcohol use: No    Alcohol/week: 0.0 standard drinks  . Drug use: No    Review of Systems Level 5 exemption history limited by the patient's clinical condition ____________________________________________   PHYSICAL EXAM:  VITAL SIGNS: ED Triage Vitals [04/29/18 0449]  Enc Vitals Group     BP      Pulse Rate 81     Resp 16     Temp      Temp Source Oral     SpO2      Weight      Height      Head Circumference      Peak Flow      Pain Score      Pain Loc      Pain Edu?      Excl. in [place]?     Constitutional: Somewhat slurred and garbled speech.  Normal respiratory rate.  Unable to tell me who she is where she is what year it is.  She does say her neck hurts Eyes: PERRL EOMI. 2 mm to 1 mm and brisk Head: Hematoma to left forehead. Nose: No congestion/rhinnorhea. Mouth/Throat: No trismus Neck: No stridor.  Neck is somewhat tender midline higher up and there does feel like a step-off mid cervical spine Cardiovascular: Normal rate, regular rhythm. Grossly normal heart sounds.  Good peripheral circulation. Respiratory: Slightly increased respiratory effort.  No retractions. Lungs CTAB and moving good air Gastrointestinal: Soft nontender Musculoskeletal: No lower extremity edema   Neurologic: Moves all 4 extremities feels all 4 extremities Skin:  Skin is warm, dry and intact. No rash noted. Psychiatric: Severe dementia   ____________________________________________   DIFFERENTIAL includes but not limited to  Syncope, mechanical fall, sepsis,  intracerebral hemorrhage, cervical spine fracture ____________________________________________   LABS (all labs ordered are listed, but only abnormal results are displayed)  Labs Reviewed  CBC WITH DIFFERENTIAL/PLATELET - Abnormal; Notable for the following components:      Result Value   Neutro Abs 8.6 (*)    All other components within normal limits  COMPREHENSIVE METABOLIC PANEL - Abnormal; Notable for the following components:   Glucose, Bld 101 (*)    BUN 24 (*)    GFR calc non Af Amer 56 (*)    All other components within normal limits  TROPONIN I  PROTIME-INR  URINALYSIS, COMPLETE (UACMP) WITH MICROSCOPIC  TYPE AND SCREEN    Lab work reviewed by me with no acute disease noted __________________________________________  EKG   ____________________________________________  RADIOLOGY  Head and neck CTs reviewed by me show no intracerebral hemorrhage but does have a C1 lateral mass fracture on the right ____________________________________________   PROCEDURES  Procedure(s) performed: no  .Critical Care Performed by: Darel Hong, MD Authorized by: Darel Hong, MD   Critical care provider statement:    Critical care time (minutes):  30   Critical care time was exclusive of:  Separately billable procedures and treating other patients   Critical care was necessary to treat or prevent imminent or life-threatening deterioration of the following conditions:  CNS failure or compromise   Critical care was time spent personally by me on the following activities:  Development of treatment plan with patient or surrogate, discussions with consultants, evaluation of patient's response to treatment, examination of patient, obtaining history from patient or surrogate, ordering and performing treatments and interventions, ordering and review of laboratory studies, ordering and review of radiographic studies, pulse oximetry, re-evaluation of patient's condition and review of  old charts    Critical Care performed: Yes  ____________________________________________   INITIAL IMPRESSION / ASSESSMENT AND PLAN / ED COURSE  Pertinent labs & imaging results that were available during my care of the patient were reviewed by me and considered in my medical decision making (see chart for details).   As part of my medical decision making, I reviewed the following data within the [place] History obtained from family if available, nursing notes, old chart and ekg, as well as notes from prior ED visits.       ----------------------------------------- 4:52 AM on 04/29/2018 -----------------------------------------  History is challenging to obtain on arrival as the patient is obtunded and slurring her speech.  EMS said this was not the case prior to fentanyl.  Regardless have a high clinical suspicion for intracerebral hemorrhage versus intoxication. ____________________________________________  CT scan showed a lateral mass fracture on the right.  I spoke with [facility] neurosurgeon Dr. Cari Caraway who is currently on-call at [facility] however does cover our hospital and is very familiar with our capabilities and he indicated this would best be treated nonoperatively in a cervical collar and he recommends inpatient admission to [place].  I have spoken with family and kept them updated.  She is neuro intact at this point.  She will be admitted to our hospital and I am adding on a CT angiogram of the neck.  I spoke with the hospitalist who verbalizes understanding and agreement with plan.  FINAL CLINICAL IMPRESSION(S) / ED DIAGNOSES  Final diagnoses:  Closed displaced lateral mass fracture of first cervical vertebra, initial encounter ([place])      NEW MEDICATIONS STARTED DURING THIS VISIT:  New Prescriptions   No medications on file     Note:  This document was prepared using Dragon voice recognition software and may include unintentional dictation  errors.     Darel Hong, MD 04/29/18 [phone]

## 2018-04-29 NOTE — Progress Notes (Signed)
Same day rounding progress note  84 y.o. female with a known history of dementia (AAOx1-2 @ baseline, w/ sundowning), HTN, GERD p/w 1d Hx ground level fall, neck pain/fracture  Family leaning towards comfort care and Hospice if she qualifies.  Time spent: 10 mins

## 2018-04-29 NOTE — Progress Notes (Signed)
Family Meeting Note  Advance Directive:yes  Today a meeting took place with the Patient and Daughters at bedside.  Patient is unable to participate due HE:[id number] capacity Dementia   The following clinical team members were present during this meeting:MD  The following were discussed:Patient's diagnosis:  84 y.o. female with a known history of dementia (AAOx1-2 @ baseline, w/ sundowning), HTN, GERD p/w 1d Hx ground level fall, neck pain/fracture Overall very poor prognosis   Past Medical History:  Diagnosis Date  . Anemia   . Asthma   . GERD (gastroesophageal reflux disease)   . Hypertension   . Inflammatory arthritis    elevated ESR, negative temporal artery bx, low titer rheumatoid factor  . Nephrolithiasis   . OA (osteoarthritis)    Left knee replacement, hands, shoulders, cervial spine  . Osteoporosis    vitamin D deficiency, nasal miacalcin, reclast, previous rib fracture  . Pancreatitis    s/p cholecystectomy with ERCP and stone extraction  . Vitamin D deficiency       , Patient's progosis: < 6 months and Goals for treatment: DNR  Additional follow-up to be provided: Palliative care - consider Hospice either at [place]  Time spent during discussion:20 minutes  Max Sane, MD

## 2018-04-29 NOTE — H&P (Signed)
[facility] NAME: Heidi [name]    MR#:  [id number]  DATE OF BIRTH:  11-16-1917  DATE OF ADMISSION:  04/29/2018  PRIMARY CARE PHYSICIAN: Einar Pheasant, MD   REQUESTING/REFERRING PHYSICIAN: Darel Hong, MD  CHIEF COMPLAINT:   Chief Complaint  Patient presents with  . Fall  . Neck Pain    HISTORY OF PRESENT ILLNESS:  Heidi [name]  is a 84 y.o. female with a known history of dementia (AAOx1-2 @ baseline, w/ sundowning), HTN, GERD p/w 1d Hx ground level fall, neck pain/fracture. Pt AAOx1 (self), unable to provide Hx or ROS (apart from c/o neck pain and HA). Hx provided by family at bedside. They tell me pt is typically oriented to person, as well as to place if she is at home during the daytime. She tends to become disoriented in the evenings. She is supposed to ambulate w/ walker, but often does not (2/2 stubbornness + forgetfulness, per family). They tell me she wakes up 2-3x/night to go to the bathroom. She woke up 3x overnight (08/18-08/19). The third time she woke up, she claimed she wanted to help her sister (who has been dead for five years) with some quilting. The fall itself was unwitnessed, but it occurred when pt attempted to ambulate w/o walker. She yelled out for family, who found her lying on her R side on the floor, c/o CP + neck pain + HA. EMS was called. Apart from c/o neck pain and HA, she does not volunteer any other complaints at the time of my assessment. She is uncomfortable and in mild distress 2/2 pain.  PAST MEDICAL HISTORY:   Past Medical History:  Diagnosis Date  . Anemia   . Asthma   . GERD (gastroesophageal reflux disease)   . Hypertension   . Inflammatory arthritis    elevated ESR, negative temporal artery bx, low titer rheumatoid factor  . Nephrolithiasis   . OA (osteoarthritis)    Left knee replacement, hands, shoulders, cervial spine  . Osteoporosis    vitamin D deficiency, nasal  miacalcin, reclast, previous rib fracture  . Pancreatitis    s/p cholecystectomy with ERCP and stone extraction  . Vitamin D deficiency     PAST SURGICAL HISTORY:   Past Surgical History:  Procedure Laterality Date  . ABDOMINAL HYSTERECTOMY     secondary  to bleeding  . LITHOTRIPSY    . REPLACEMENT TOTAL KNEE     Left knee replacement    SOCIAL HISTORY:   Social History   Tobacco Use  . Smoking status: Never Smoker  . Smokeless tobacco: Never Used  Substance Use Topics  . Alcohol use: No    Alcohol/week: 0.0 standard drinks    FAMILY HISTORY:   Family History  Problem Relation Age of Onset  . Hypertension Mother   . Stroke Mother   . Stomach cancer Father   . Arthritis Brother   . Lung cancer Brother     DRUG ALLERGIES:   Allergies  Allergen Reactions  . Anhydrous Base Other (See Comments)  . Aspirin   . Carbapenems Other (See Comments)  . Cephalosporins Other (See Comments)  . Levaquin [Levofloxacin In D5w]     Questionable rash   . Penicillins     Has patient had a PCN reaction causing immediate rash, facial/tongue/throat swelling, SOB or lightheadedness with hypotension: No Has patient had a PCN reaction causing severe rash involving mucus membranes or skin necrosis: No Has patient  had a PCN reaction that required hospitalization: No Has patient had a PCN reaction occurring within the last 10 years: No If all of the above answers are "NO", then may proceed with Cephalosporin use.   . Sulfa Antibiotics   . Sulfonylureas Other (See Comments)  . Thiazide-Type Diuretics Other (See Comments)  . Tramadol Other (See Comments)    Dizziness   . Macrobid [Nitrofurantoin Monohyd Macro] Rash  . Omnicef [Cefdinir] Rash    REVIEW OF SYSTEMS:   Review of Systems  Unable to perform ROS: Dementia  Musculoskeletal: Positive for neck pain.  Neurological: Positive for headaches.   MEDICATIONS AT HOME:   Prior to Admission medications   Medication Sig Start  Date End Date Taking? Authorizing Provider  acetaminophen (TYLENOL) 650 MG CR tablet Take 1,300 mg by mouth 2 (two) times daily.    Yes [provider]  amLODipine (NORVASC) 5 MG tablet Take 1 tablet (5 mg total) by mouth daily. 03/21/17  Yes Einar Pheasant, MD  aspirin EC 81 MG tablet Take 81 mg by mouth daily.   Yes [provider]  B Complex-C (B-COMPLEX WITH VITAMIN C) tablet Take 1 tablet by mouth daily.   Yes [provider]  Calcium Carbonate-Vitamin D (CALCIUM 600+D) 600-200 MG-UNIT TABS Take 1 tablet by mouth 2 (two) times daily.   Yes [provider]  meloxicam (MOBIC) 15 MG tablet Take 15 mg by mouth daily.   Yes [provider]  metoprolol succinate (TOPROL-XL) 25 MG 24 hr tablet TAKE (1/2) TABLET BY MOUTH ONCE DAILY. 04/08/18  Yes Einar Pheasant, MD  mirtazapine (REMERON) 7.5 MG tablet Take 1 tablet (7.5 mg total) by mouth at bedtime. 04/26/18  Yes Einar Pheasant, MD  montelukast (SINGULAIR) 10 MG tablet Take 1 tablet (10 mg total) by mouth daily. 04/08/18  Yes Einar Pheasant, MD  vitamin C (ASCORBIC ACID) 500 MG tablet Take 500 mg by mouth daily.   Yes [provider]  vitamin E 400 UNIT capsule Take 400 Units by mouth daily.   Yes [provider]  Cholecalciferol (VITAMIN D) 2000 UNITS tablet Take 2,000 Units by mouth daily.    [provider]  losartan (COZAAR) 50 MG tablet  03/11/18   [provider]      VITAL SIGNS:  Blood pressure (!) 142/74, pulse 77, temperature (!) 97.5 F (36.4 C), temperature source Oral, resp. rate 14, height 5' (1.524 m), weight 45.8 kg, SpO2 98 %.  PHYSICAL EXAMINATION:  Physical Exam  Constitutional: She appears well-developed. She is active and cooperative.  Non-toxic appearance. She does not have a sickly appearance. She does not appear ill. She appears distressed (Mild distress 2/2 neck pain.). She is not intubated. Cervical collar in place.  HENT:  Mouth/Throat:  Oropharynx is clear and moist. No oropharyngeal exudate.  (+) hematoma vertex.  Eyes: Conjunctivae, EOM and lids are normal. No scleral icterus.  Neck:  [place]  Cardiovascular: Normal rate, regular rhythm, S1 normal, S2 normal and normal heart sounds.  No extrasystoles are present. Exam reveals no gallop, no S3, no S4, no distant heart sounds and no friction rub.  No murmur heard. Pulmonary/Chest: Effort normal. No accessory muscle usage or stridor. No apnea, no tachypnea and no bradypnea. She is not intubated. No respiratory distress. She has no decreased breath sounds. She has no wheezes. She has no rhonchi. She has no rales.  Abdominal: Soft. She exhibits no distension. Bowel sounds are decreased. There is no tenderness. There is  no rigidity, no rebound and no guarding.  Musculoskeletal: Normal range of motion. She exhibits no edema or tenderness.  Neurological: She is alert. She is disoriented.  Dementia, AAOx1 (self).  Skin: Skin is warm and dry. No rash noted. She is not diaphoretic. No erythema.  Psychiatric: Her mood appears not anxious. Her affect is not angry, not blunt and not labile. Her speech is not rapid and/or pressured, not delayed and not slurred. She is not agitated, not aggressive, not hyperactive, not slowed, not withdrawn and not combative. She does not exhibit a depressed mood. She is communicative. She is attentive.   Non-bradycardic at the time of my examination. LABORATORY PANEL:   CBC Recent Labs  Lab 04/29/18 0612  WBC 10.3  HGB 12.8  HCT 38.1  PLT 202   ------------------------------------------------------------------------------------------------------------------  Chemistries  Recent Labs  Lab 04/29/18 0612  NA 141  K 3.8  CL 106  CO2 26  GLUCOSE 101*  BUN 24*  CREATININE 0.85  CALCIUM 10.0  AST 24  ALT 15  ALKPHOS 46  BILITOT 0.6    ------------------------------------------------------------------------------------------------------------------  Cardiac Enzymes Recent Labs  Lab 04/29/18 0612  TROPONINI <0.03   ------------------------------------------------------------------------------------------------------------------  RADIOLOGY:  Dg Elbow Complete Right  Result Date: 04/29/2018 CLINICAL DATA:  Skin tear after a fall. EXAM: RIGHT ELBOW - COMPLETE 3+ VIEW COMPARISON:  None. FINDINGS: There is no evidence of fracture, dislocation, or joint effusion. There is no evidence of arthropathy or other focal bone abnormality. Soft tissues are unremarkable. IMPRESSION: No evidence of acute fracture or dislocation. Electronically Signed   By: Lucienne Capers M.D.   On: 04/29/2018 05:37   Ct Head Wo Contrast  Result Date: 04/29/2018 CLINICAL DATA:  Initial evaluation for acute trauma, fall. EXAM: CT HEAD WITHOUT CONTRAST CT CERVICAL SPINE WITHOUT CONTRAST TECHNIQUE: Multidetector CT imaging of the head and cervical spine was performed following the standard protocol without intravenous contrast. Multiplanar CT image reconstructions of the cervical spine were also generated. COMPARISON:  Prior CT from 01/17/2018 FINDINGS: CT HEAD FINDINGS Brain: Atrophy with chronic small vessel ischemic disease. No acute intracranial hemorrhage. No acute large vessel territory infarct. No mass lesion, midline shift or mass effect. No hydrocephalus. No extra-axial fluid collection. Vascular: No hyperdense vessel. Scattered vascular calcifications noted within the carotid siphons. Skull: Large hematoma present at the scalp vertex. Calvarium intact. Sinuses/Orbits: Globes and orbital soft tissues demonstrate no acute abnormality. Chronic mucoperiosteal thickening present within the ethmoidal air cells, maxillary sinuses, and left sphenoid sinus. Mastoid air cells are clear. Other: None. CT CERVICAL SPINE FINDINGS Alignment: Trace anterolisthesis of  C2 on C3, with trace retrolisthesis of C3 on C4, C4 on C5, and C5 on C6. Trace anterolisthesis of C7 on T1. Findings are likely chronic and facet mediated. Skull base and vertebrae: Visualized skull base intact. Acute minimally displaced fracture of the right occipital condyle (series 9, image 33). There is an acute comminuted fracture extending through the right lateral mass of C1 (series 4, image 19). Partial extension into the right aspect of the anterior arch (series 3, image 11). Posterior arch grossly intact. Involvement of the right C1 transverse foramen. Normal atlantodental interval maintained. Dens intact. Vertebral body heights maintained. No other acute fracture. Soft tissues and spinal canal: Soft tissues of the neck demonstrate no acute finding. No significant prevertebral edema. Vascular calcifications present about the carotid bifurcations. Possible small amount of epidural hematoma within the upper cervical spine. Disc levels: Prominent degenerative thickening about the tectorial membrane. Moderate multilevel cervical  spondylolysis at C3-4 through C6-7. Multilevel facet arthrosis, worse on the right, most notable at C2-3 and C7-T1. Upper chest: Visualized upper chest demonstrates no acute finding. Partially visualized lungs are clear. Other: None. IMPRESSION: CT BRAIN: 1. No acute intracranial process identified. 2. Large soft tissue hematoma at the scalp vertex. No calvarial fracture. 3. Atrophy with chronic small vessel ischemic disease. CT CERVICAL SPINE: 1. Acute comminuted fracture involving the right lateral mass of C1 with involvement of the right transverse foramen. Follow-up examination with CTA to evaluate for possible vascular injury suggested. 2. Acute minimally displaced right occipital condyle fracture. 3. No other acute traumatic injury within the cervical spine. 4. Moderate cervical spondylolysis at C3-4 through C6-7. Critical Value/emergent results were called by telephone at the  time of interpretation on 04/29/2018 at 5:48 am to Dr. Darel Hong , who verbally acknowledged these results. Electronically Signed   By: Jeannine Boga M.D.   On: 04/29/2018 05:50   Ct Angio Neck W And/or Wo Contrast  Result Date: 04/29/2018 CLINICAL DATA:  Fracture right occipital condyle and C1. Rule out arterial injury EXAM: CT ANGIOGRAPHY NECK TECHNIQUE: Multidetector CT imaging of the neck was performed using the standard protocol during bolus administration of intravenous contrast. Multiplanar CT image reconstructions and MIPs were obtained to evaluate the vascular anatomy. Carotid stenosis measurements (when applicable) are obtained utilizing NASCET criteria, using the distal internal carotid diameter as the denominator. CONTRAST:  16m ISOVUE-370 IOPAMIDOL (ISOVUE-370) INJECTION 76% COMPARISON:  CT cervical spine 04/29/2018 FINDINGS: Aortic arch: Mild atherosclerotic disease in the aortic arch. Proximal great vessels are tortuous with mild atherosclerotic calcification but no significant stenosis. Right carotid system: Minimal atherosclerotic disease right carotid bifurcation without stenosis or vascular injury. Left carotid system: Mild atherosclerotic calcification left carotid bifurcation without stenosis or vascular injury. Vertebral arteries: Right vertebral artery dominant and tortuous. Comminuted fracture lateral mass of C1 on the right. Fracture extends through the vertebral foramen with flattening of the vertebral artery as it passes through the narrowed foramen. No occlusion or thrombus. No definite intimal irregularity. Luminal diameter approximately 50% diameter stenosis. Left vertebral artery is widely patent without significant stenosis or injury. Skeleton: Fracture of the occipital condyle on the right. Comminuted fracture right C1 lateral mass extending into the vertebral foramen. These are reported on the earlier cervical spine CT. Multilevel spondylosis throughout the cervical  spine. Mild anterolisthesis C2-3 with extensive facet degeneration on the right at C2-3. Other neck: Negative for mass or adenopathy Upper chest: Negative IMPRESSION: As noted on earlier cervical spine CT, there is a comminuted fracture of the right lateral mass of C1 extending into the vertebral foramen. 50% diameter stenosis of the distal right vertebral artery at the C1 level due to bony narrowing of the vertebral foramen. No evidence of vascular irregularity or thrombus. Left vertebral artery normal. Mild atherosclerotic disease in the carotid bifurcation bilaterally. No significant carotid stenosis. Advanced cervical spondylosis. Electronically Signed   By: CFranchot GalloM.D.   On: 04/29/2018 08:03   Ct Cervical Spine Wo Contrast  Result Date: 04/29/2018 CLINICAL DATA:  Initial evaluation for acute trauma, fall. EXAM: CT HEAD WITHOUT CONTRAST CT CERVICAL SPINE WITHOUT CONTRAST TECHNIQUE: Multidetector CT imaging of the head and cervical spine was performed following the standard protocol without intravenous contrast. Multiplanar CT image reconstructions of the cervical spine were also generated. COMPARISON:  Prior CT from 01/17/2018 FINDINGS: CT HEAD FINDINGS Brain: Atrophy with chronic small vessel ischemic disease. No acute intracranial hemorrhage. No acute large vessel  territory infarct. No mass lesion, midline shift or mass effect. No hydrocephalus. No extra-axial fluid collection. Vascular: No hyperdense vessel. Scattered vascular calcifications noted within the carotid siphons. Skull: Large hematoma present at the scalp vertex. Calvarium intact. Sinuses/Orbits: Globes and orbital soft tissues demonstrate no acute abnormality. Chronic mucoperiosteal thickening present within the ethmoidal air cells, maxillary sinuses, and left sphenoid sinus. Mastoid air cells are clear. Other: None. CT CERVICAL SPINE FINDINGS Alignment: Trace anterolisthesis of C2 on C3, with trace retrolisthesis of C3 on C4, C4 on  C5, and C5 on C6. Trace anterolisthesis of C7 on T1. Findings are likely chronic and facet mediated. Skull base and vertebrae: Visualized skull base intact. Acute minimally displaced fracture of the right occipital condyle (series 9, image 33). There is an acute comminuted fracture extending through the right lateral mass of C1 (series 4, image 19). Partial extension into the right aspect of the anterior arch (series 3, image 11). Posterior arch grossly intact. Involvement of the right C1 transverse foramen. Normal atlantodental interval maintained. Dens intact. Vertebral body heights maintained. No other acute fracture. Soft tissues and spinal canal: Soft tissues of the neck demonstrate no acute finding. No significant prevertebral edema. Vascular calcifications present about the carotid bifurcations. Possible small amount of epidural hematoma within the upper cervical spine. Disc levels: Prominent degenerative thickening about the tectorial membrane. Moderate multilevel cervical spondylolysis at C3-4 through C6-7. Multilevel facet arthrosis, worse on the right, most notable at C2-3 and C7-T1. Upper chest: Visualized upper chest demonstrates no acute finding. Partially visualized lungs are clear. Other: None. IMPRESSION: CT BRAIN: 1. No acute intracranial process identified. 2. Large soft tissue hematoma at the scalp vertex. No calvarial fracture. 3. Atrophy with chronic small vessel ischemic disease. CT CERVICAL SPINE: 1. Acute comminuted fracture involving the right lateral mass of C1 with involvement of the right transverse foramen. Follow-up examination with CTA to evaluate for possible vascular injury suggested. 2. Acute minimally displaced right occipital condyle fracture. 3. No other acute traumatic injury within the cervical spine. 4. Moderate cervical spondylolysis at C3-4 through C6-7. Critical Value/emergent results were called by telephone at the time of interpretation on 04/29/2018 at 5:48 am to Dr.  Darel Hong , who verbally acknowledged these results. Electronically Signed   By: Jeannine Boga M.D.   On: 04/29/2018 05:50   Dg Chest Port 1 View  Result Date: 04/29/2018 CLINICAL DATA:  Neck pain after a fall. EXAM: PORTABLE CHEST 1 VIEW COMPARISON:  01/26/2018 FINDINGS: Cardiac enlargement. No pulmonary vascular congestion. Scattered calcified granulomas in the lungs. No edema or consolidation. No blunting of costophrenic angles. No pneumothorax. Calcified and tortuous aorta. Old appearing bilateral rib fractures. Degenerative changes in the spine and shoulders. Previous resection or resorption of the distal left clavicle. 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 IMPRESSION: Cardiac enlargement. No evidence of active pulmonary disease. Electronically Signed   By: Lucienne Capers M.D.   On: 04/29/2018 05:36   IMPRESSION AND PLAN:   A/P: 81F p/w 1d Hx ground level fall, neck pain. CT neck (+) comminuted fracture of R lateral mass of C1 extending into vertebral foramen. Baseline dementia. Hyperglycemia, mild azotemia, dehydration. -Ground level fall, C1 Fx: Pt w/ dementia, advanced age. 1d Hx unwitnessed fall. CT head (+) hematoma at vertex, (-) intracranial hemorrhage. CT neck (+) comminuted fracture of R lateral mass of C1 extending into vertebral foramen. [facility] consult for optimal non-operative mgmt recs. Pain ctrl. -Hyperglycemia, mild azotemia, dehydration: Gentle IVF. -c/w  home meds/formulary subs. -FEN/GI: Cardiac diet. -DVT PPx: Lovenox. -Code status: DNR/DNI per family. -Disposition: Admission, > 2 midnights.   All the records are reviewed and case discussed with ED provider. Management plans discussed with the patient, family and they are in agreement.  CODE STATUS: DNR/DNI.  TOTAL TIME TAKING CARE OF THIS PATIENT: 75 minutes.    Arta Silence M.D on 04/29/2018 at 9:20 AM  Between 7am to 6pm - Pager -  [phone]  After 6pm go to [email or web address] - Proofreader  [facility]  Office  [phone]  CC: Primary care physician; Einar Pheasant, MD   Note: This dictation was prepared with Dragon dictation along with smaller phrase technology. Any transcriptional errors that result from this process are unintentional.

## 2018-04-29 NOTE — ED Notes (Signed)
Pt's family has come out to the secretary's desk several times, per secretary, demanding pain medication for patient. Adrian Blackwater, RN went to give patient pain medication. At that time, pt stated she did not want it. Cassie gave this RN the pain medication. Within minutes, family back at desk requesting pain medication administration. This RN went in to see patient; patient asleep. Pt stating she is in pain, but insisting she does not want pain medication. States "I just want to pee" and "I want this thing off" in regards to c-collar. This RN educated patient that she has an external catheter placed and can pee if she needs to and that we cannot take c-collar off. Pt refusing pain medication, stating "I don't want that medication that will put me to sleep." Daughter stating "I've got healthcare power of attorney. Can't we just give it to her?" This RN stated that I do not feel comfortable medicating someone who I believe understands that she is refusing pain medication and adamantly refusing to more than one RN. Will continue to monitor patient.

## 2018-04-29 NOTE — ED Notes (Signed)
Admitting MD at bedside.

## 2018-04-29 NOTE — Progress Notes (Signed)
Patient ID: [name], female   DOB: 1918-07-25, 84 y.o.   MRN: [id number]    Consult received at 17:54.  Imaging reviewed.  Recommend cervical immobilization with rigid c-collar at all times.  Recommend external bone stimulator for fracture healing.  Upright films when able.  Full consult follow.

## 2018-04-29 NOTE — ED Notes (Signed)
Lab here for venipuncture. Staff x2 have attempted venipuncture without success.

## 2018-04-29 NOTE — ED Notes (Signed)
md in to updated family.

## 2018-04-30 DIAGNOSIS — S12040A Displaced lateral mass fracture of first cervical vertebra, initial encounter for closed fracture: Principal | ICD-10-CM

## 2018-04-30 DIAGNOSIS — Z515 Encounter for palliative care: Secondary | ICD-10-CM

## 2018-04-30 DIAGNOSIS — Z7189 Other specified counseling: Secondary | ICD-10-CM

## 2018-04-30 DIAGNOSIS — S12000A Unspecified displaced fracture of first cervical vertebra, initial encounter for closed fracture: Secondary | ICD-10-CM | POA: Diagnosis present

## 2018-04-30 DIAGNOSIS — W1830XA Fall on same level, unspecified, initial encounter: Secondary | ICD-10-CM

## 2018-04-30 DIAGNOSIS — E43 Unspecified severe protein-calorie malnutrition: Secondary | ICD-10-CM

## 2018-04-30 DIAGNOSIS — F0390 Unspecified dementia without behavioral disturbance: Secondary | ICD-10-CM

## 2018-04-30 LAB — CBC
HCT: 30.8 % — ABNORMAL LOW (ref 35.0–47.0)
HEMOGLOBIN: 10.6 g/dL — AB (ref 12.0–16.0)
MCH: 32.8 pg (ref 26.0–34.0)
MCHC: 34.3 g/dL (ref 32.0–36.0)
MCV: 95.6 fL (ref 80.0–100.0)
Platelets: 167 10*3/uL (ref 150–440)
RBC: 3.22 MIL/uL — AB (ref 3.80–5.20)
RDW: 13.3 % (ref 11.5–14.5)
WBC: 10.2 10*3/uL (ref 3.6–11.0)

## 2018-04-30 LAB — BASIC METABOLIC PANEL
ANION GAP: 6 (ref 5–15)
BUN: 23 mg/dL (ref 8–23)
CHLORIDE: 108 mmol/L (ref 98–111)
CO2: 26 mmol/L (ref 22–32)
Calcium: 8.8 mg/dL — ABNORMAL LOW (ref 8.9–10.3)
Creatinine, Ser: 0.84 mg/dL (ref 0.44–1.00)
GFR calc non Af Amer: 56 mL/min — ABNORMAL LOW (ref >60)
Glucose, Bld: 110 mg/dL — ABNORMAL HIGH (ref 70–99)
POTASSIUM: 4 mmol/L (ref 3.5–5.1)
Sodium: 140 mmol/L (ref 135–145)

## 2018-04-30 MED ORDER — SODIUM CHLORIDE 0.9% FLUSH
3.0000 mL | Freq: Two times a day (BID) | INTRAVENOUS | Status: DC
  Administered 2018-04-30 – 2018-05-02 (×5): 3 mL via INTRAVENOUS

## 2018-04-30 MED ORDER — ENOXAPARIN SODIUM 40 MG/0.4ML ~~LOC~~ SOLN
40.0000 mg | SUBCUTANEOUS | Status: DC
  Administered 2018-04-30: 40 mg via SUBCUTANEOUS
  Filled 2018-04-30 (×2): qty 0.4

## 2018-04-30 MED ORDER — ENSURE ENLIVE PO LIQD
237.0000 mL | Freq: Two times a day (BID) | ORAL | Status: DC
  Administered 2018-05-01: 237 mL via ORAL

## 2018-04-30 MED ORDER — ADULT MULTIVITAMIN W/MINERALS CH: 1.0000 | ORAL_TABLET | Freq: Every day | ORAL | Status: DC

## 2018-04-30 NOTE — Consult Note (Signed)
Consultation Note Date: 04/30/18  Patient Name: Heidi [name]  DOB: September 16, 1917  MRN: [id number]  Age / Sex: 84 y.o., female  PCP: Einar Pheasant, MD Referring Physician: Epifanio Lesches, MD  Reason for Consultation: Establishing goals of care  HPI/Patient Profile: 84 y.o. female  with past medical history of dementia, osteoarthritis, hypertension, GERD, anemia, vitamin D deficiency, and pancreatitis admitted on 04/29/2018 after fall and neck pain from home. Imagining revealed fracture of right C1 facet that extends into foramen transversarium. CTA reveals mild compression of the vertebral artery without occlusion or injury. Cervical collar placed. Patient is not a surgical candidate secondary to age, frailty, and dementia. Palliative medicine consultation for goals of care.   Clinical Assessment and Goals of Care:  I have reviewed medical records, discussed with care team, and met with patients daughter and two sons in conference room to discuss diagnosis, prognosis, GOC, EOL wishes, disposition and options.  Introduced Palliative Medicine as specialized medical care for people living with serious illness. It focuses on providing relief from the symptoms and stress of a serious illness. The goal is to improve quality of life for both the patient and the family.  We discussed a brief life review of the patient. Family describes her as busy and active prior to dementia diagnosis. 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 Prior to hospitalization, living at home with intermittent visits from children. Baseline, patient requires assist with ADL's and walker for ambulation. She sundowns every afternoon/evening. Appetite has been fair but requiring more assist and encouragement with eating/drinking.   Discussed events leading up to hospitalization and course of hospital diagnoses and  interventions. Children have a good understanding that she is NOT a surgical candidate. They speak of her not understanding that she needs to keep the cervical collar on. Interested in bone stimulator if insurance will cover.   Discussed disease trajectory of dementia and concern with worsening functional, nutritional, and cognitive status after this traumatic fall. She worked with speech therapy this afternoon. She has been eating/drinking very poorly since admission.   I attempted to elicit values and goals of care important to the patient. Advanced directives, concepts specific to code status, artifical feeding and hydration, and rehospitalization were considered and discussed. Daughter confirms that her and other daughter Heidi [name]) and documented [place]. She will try to find living will tonight to bring to hospital tomorrow. Children confirm patient's wishes for DNR and against heroic interventions. Family tearful. They speak of wanting comfort for her and don't want to see her "suffer." Son, Heidi [name], shares that he does not think pushing therapy will benefit her.   The difference between aggressive medical intervention and comfort care was considered in light of the patient's goals of care. Explained concern with poor prognosis, possibly weeks, if she continues to decline food/drink. Educated on EOL expectations.   Introduced hospice philosophy and options. Daughter becomes tearful. She speaks of her mother's biggest fear of being placed into a facility. Family may try to take her  back home with support from hospice. They understand she will need 24/7 care and may need to consider hiring caregivers (since children all work). Caregiver list provided.   Questions and concerns were addressed.  Hard Choices booklet left for review. Introduced MOST form. PMT contact information given.     SUMMARY OF RECOMMENDATIONS    DNR in the event of cardiac arrest. Continue supportive care and current  interventions.   Requested copy of living will from family. Introduced MOST form.   Patient has 4 adult children who are all involved in decision making.   Continue cervical collar per neurosurgery. Pending insurance approval of bone stimulator.  Comfort feeds per SLP recommendations. Patient requires assist and encouragement.   Introduced hospice philosophy and options. Family considering hospice options and request more time to discuss with their other sister, Heidi [name].   Private duty caregiver list provided for possibly home with hospice. Family speaks of importance of trying to keep the patient in her home.   PMT will follow.   Code Status/Advance Care Planning:  DNR  Symptom Management:   Per attending  Palliative Prophylaxis:   Aspiration, Delirium Protocol, Frequent Pain Assessment, Oral Care and Turn Reposition  Psycho-social/Spiritual:   Desire for further Chaplaincy support:yes  Additional Recommendations: Caregiving  Support/Resources and Education on Hospice  Prognosis:   Unable to determine: guarded after fall, cervical fracture not a surgical candidate, and baseline dementia with declining functional, cognitive, and nutritional status.   Discharge Planning: To Be Determined: Family considering hospice options      Primary Diagnoses: Present on Admission: . Fall from ground level . C1 cervical fracture ([place])   I have reviewed the medical record, interviewed the patient and family, and examined the patient. The following aspects are pertinent.  Past Medical History:  Diagnosis Date  . Anemia   . Asthma   . GERD (gastroesophageal reflux disease)   . Hypertension   . Inflammatory arthritis    elevated ESR, negative temporal artery bx, low titer rheumatoid factor  . Nephrolithiasis   . OA (osteoarthritis)    Left knee replacement, hands, shoulders, cervial spine  . Osteoporosis    vitamin D deficiency, nasal miacalcin, reclast, previous rib  fracture  . Pancreatitis    s/p cholecystectomy with ERCP and stone extraction  . Vitamin D deficiency    Social History   Socioeconomic History  . Marital status: Unknown    Spouse name: Not on file  . Number of children: 5  . Years of education: Not on file  . Highest education level: Not on file  Occupational History  . Not on file  Social Needs  . Financial resource strain: Not on file  . Food insecurity:    Worry: Not on file    Inability: Not on file  . Transportation needs:    Medical: Not on file    Non-medical: Not on file  Tobacco Use  . Smoking status: Never Smoker  . Smokeless tobacco: Never Used  Substance and Sexual Activity  . Alcohol use: No    Alcohol/week: 0.0 standard drinks  . Drug use: No  . Sexual activity: Not on file  Lifestyle  . Physical activity:    Days per week: Not on file    Minutes per session: Not on file  . Stress: Not on file  Relationships  . Social connections:    Talks on phone: Not on file    Gets together: Not on file    Attends religious service:  Not on file    Active member of club or organization: Not on file    Attends meetings of clubs or organizations: Not on file    Relationship status: Not on file  Other Topics Concern  . Not on file  Social History Narrative   She is widowed. She has five children, three daughter and two son.   Family History  Problem Relation Age of Onset  . Hypertension Mother   . Stroke Mother   . Stomach cancer Father   . Arthritis Brother   . Lung cancer Brother    Scheduled Meds: . amLODipine  5 mg Oral Daily  . calcium-vitamin D  1 tablet Oral BID  . cholecalciferol  2,000 Units Oral Daily  . enoxaparin (LOVENOX) injection  40 mg Subcutaneous Q24H  . feeding supplement (ENSURE ENLIVE)  237 mL Oral BID BM  . losartan  50 mg Oral Daily  . metoprolol succinate  12.5 mg Oral Daily  . mirtazapine  7.5 mg Oral QHS  . montelukast  10 mg Oral Daily  . multivitamin with minerals  1  tablet Oral Daily  . sodium chloride flush  3 mL Intravenous Q12H  . vitamin C  500 mg Oral Daily  . vitamin E  400 Units Oral Daily   Continuous Infusions: PRN Meds:.acetaminophen **OR** acetaminophen, bisacodyl, morphine injection, ondansetron **OR** ondansetron (ZOFRAN) IV, senna-docusate Medications Prior to Admission:  Prior to Admission medications   Medication Sig Start Date End Date Taking? Authorizing Provider  acetaminophen (TYLENOL) 650 MG CR tablet Take 1,300 mg by mouth 2 (two) times daily.    Yes [provider]  amLODipine (NORVASC) 5 MG tablet Take 1 tablet (5 mg total) by mouth daily. 03/21/17  Yes Einar Pheasant, MD  aspirin EC 81 MG tablet Take 81 mg by mouth daily.   Yes [provider]  B Complex-C (B-COMPLEX WITH VITAMIN C) tablet Take 1 tablet by mouth daily.   Yes [provider]  Calcium Carbonate-Vitamin D (CALCIUM 600+D) 600-200 MG-UNIT TABS Take 1 tablet by mouth 2 (two) times daily.   Yes [provider]  meloxicam (MOBIC) 15 MG tablet Take 15 mg by mouth daily.   Yes [provider]  metoprolol succinate (TOPROL-XL) 25 MG 24 hr tablet TAKE (1/2) TABLET BY MOUTH ONCE DAILY. 04/08/18  Yes Einar Pheasant, MD  mirtazapine (REMERON) 7.5 MG tablet Take 1 tablet (7.5 mg total) by mouth at bedtime. 04/26/18  Yes Einar Pheasant, MD  montelukast (SINGULAIR) 10 MG tablet Take 1 tablet (10 mg total) by mouth daily. 04/08/18  Yes Einar Pheasant, MD  vitamin C (ASCORBIC ACID) 500 MG tablet Take 500 mg by mouth daily.   Yes [provider]  vitamin E 400 UNIT capsule Take 400 Units by mouth daily.   Yes [provider]  Cholecalciferol (VITAMIN D) 2000 UNITS tablet Take 2,000 Units by mouth daily.    [provider]  losartan (COZAAR) 50 MG tablet  03/11/18   [provider]   Allergies  Allergen Reactions  . Anhydrous Base Other (See Comments)  . Aspirin   . Carbapenems Other (See Comments)  .  Cephalosporins Other (See Comments)  . Levaquin [Levofloxacin In D5w]     Questionable rash   . Penicillins     Has patient had a PCN reaction causing immediate rash, facial/tongue/throat swelling, SOB or lightheadedness with hypotension: No Has patient had a PCN reaction causing severe rash involving mucus membranes or skin necrosis: No  Has patient had a PCN reaction that required hospitalization: No Has patient had a PCN reaction occurring within the last 10 years: No If all of the above answers are "NO", then may proceed with Cephalosporin use.   . Sulfa Antibiotics   . Sulfonylureas Other (See Comments)  . Thiazide-Type Diuretics Other (See Comments)  . Tramadol Other (See Comments)    Dizziness   . Macrobid [Nitrofurantoin Monohyd Macro] Rash  . Omnicef [Cefdinir] Rash   Review of Systems  Unable to perform ROS: Dementia   Physical Exam  Constitutional: She is easily aroused. She appears ill.  HENT:  Head: Normocephalic and atraumatic.  Pulmonary/Chest: No accessory muscle usage. No tachypnea. No respiratory distress.  Abdominal: There is no tenderness.  Neurological: She is alert and easily aroused. She is disoriented.  Skin: Skin is warm and dry. There is pallor.  Psychiatric: Her speech is delayed. Cognition and memory are impaired. She is inattentive.  Nursing note and vitals reviewed.  Vital Signs: BP (!) 120/57   Pulse (!) 107   Temp 99.3 F (37.4 C) (Oral)   Resp 16   Ht 5' (1.524 m)   Wt 59.9 kg   SpO2 92%   BMI 25.78 kg/m  Pain Scale: Faces POSS *See Group Information*: S-Acceptable,Sleep, easy to arouse Pain Score: 0-No pain   SpO2: SpO2: 92 % O2 Device:SpO2: 92 % O2 Flow Rate: .   IO: Intake/output summary:   Intake/Output Summary (Last 24 hours) at 05/01/2018 4270 Last data filed at 05/01/2018 6237 Gross per 24 hour  Intake 0 ml  Output 350 ml  Net -350 ml    LBM: Last BM Date: 04/28/18 Baseline Weight: Weight: 45.8 kg Most recent  weight: Weight: 59.9 kg     Palliative Assessment/Data: PPS 20%   Flowsheet Rows     Most Recent Value  Intake Tab  Referral Department  Hospitalist  Unit at Time of Referral  Med/Surg Unit  Palliative Care Primary Diagnosis  Neurology  Palliative Care Type  New Palliative care  Reason for referral  Clarify Goals of Care  Date first seen by Palliative Care  04/30/18  Clinical Assessment  Palliative Performance Scale Score  20%  Psychosocial & Spiritual Assessment  Palliative Care Outcomes  Patient/Family meeting held?  Yes  Who was at the meeting?  daughter, two sons  Palliative Care Outcomes  Clarified goals of care, Counseled regarding hospice, Provided psychosocial or spiritual support, ACP counseling assistance, Provided end of life care assistance, Changed to focus on comfort, Improved pain interventions, Improved non-pain symptom therapy      Time In: 1530 Time Out: 1650 Time Total: 75mn Greater than 50%  of this time was spent counseling and coordinating care related to the above assessment and plan.  Signed by:  MIhor Dow FNP-C Palliative Medicine Team  Phone: [phone]: [phone]  Please contact Palliative Medicine Team phone at [phone] questions and concerns.  For individual provider: See AShea Evans

## 2018-04-30 NOTE — Evaluation (Addendum)
Clinical/Bedside Swallow Evaluation Patient Details  Name: Heidi [name] MRN: [id number] Date of Birth: Nov 06, 1917  Today's Date: 04/30/2018 Time: SLP Start Time (ACUTE ONLY): 57 SLP Stop Time (ACUTE ONLY): 1330 SLP Time Calculation (min) (ACUTE ONLY): 60 min  Past Medical History:  Past Medical History:  Diagnosis Date  . Anemia   . Asthma   . GERD (gastroesophageal reflux disease)   . Hypertension   . Inflammatory arthritis    elevated ESR, negative temporal artery bx, low titer rheumatoid factor  . Nephrolithiasis   . OA (osteoarthritis)    Left knee replacement, hands, shoulders, cervial spine  . Osteoporosis    vitamin D deficiency, nasal miacalcin, reclast, previous rib fracture  . Pancreatitis    s/p cholecystectomy with ERCP and stone extraction  . Vitamin D deficiency    Past Surgical History:  Past Surgical History:  Procedure Laterality Date  . ABDOMINAL HYSTERECTOMY     secondary  to bleeding  . LITHOTRIPSY    . REPLACEMENT TOTAL KNEE     Left knee replacement   HPI:  Pt is a 84 year old woman who suffered a fall.  She has a known history of Dementia.  Previous medical history also includes hypertension, GERD.  Approximately a little over 24 hours ago she suffered a ground-level fall resulting in neck pain.  She was brought to the emergency room where imaging was obtained demonstrating a fracture of the right C1 facet that extended into the foramen transversarium.  CTA was obtained demonstrating mild compression of the vertebral artery without occlusion or intimal injury.  The patient was placed in a collar and she was admitted.  Currently, pt is resting often; verbally engages w/ family and answers some basic questions but often c/o wanting to "get up and go home", "get up to pee", "leave me alone".  Pt required max verbal/tactile cues to take the few po trials she accepted b/f appearing too fatigued and falling asleep.  Addendum: per Dietitian note, pt has  Severe Malnutrition related to chronic illness(dementia, advanced age ) as evidenced by severe fat depletion, severe muscle depletion baseline.  Assessment / Plan / Recommendation Clinical Impression  Pt appears to present w/ adequate oropharyngeal phase swallow function w/ reduced risk for aspiration when following general aspiration precautions. However, pt is in a Neck Collar restricting her head/neck/jaw movement w/ oral intake and swallowing. Pt also has Dementia and required MAX verbal/tactile cues and encouragement to take oral intake. Pt consumed few trials of thin liquids via pinched straw, straw then purees w/ no immediate, overt s/s of aspiration noted; clear vocal quality post trials and no decline in respiratory status during/post trials. Single swallows noted. During Oral phase, pt exhibited reduced awareness when accepting boluses; max verbal/tactile cues were required for encouraging her to take po trials. A pinched straw strategy was utilized to encourage oral interest/awareness. Pt was able to follow through and took 2-3 sips via straw after w/ Daughter. W/ SLP and Son feeding her and giving verbal/tactile cues, pt consumed the few po trials of Puree w/ adequate bolus management/mastication and oral clearing of the both the food/liquid trials. Pt needed min time b/t trials to regulate self w/ the stress and exertion of the po tasks. Encouraged and instructed family on reducing distractions as much as possible and getting pt to help engage in the po tasks for compliance w/ intake. Educated on eating a Pureed foods consistency at this time for easier oral intake. Recommended po's of interest to increase  desire for po's. Encouraged Cream Soups. Pt requires total assistance w/ eating/drinking. Recommend a Pureed diet w/ thin liquids, aspiration precautions, Pills Crushed in Puree for easier swallowing. Feeding Support at meals; verbal/tactile cues. No need to wear dentures at this time w/ this  diet. Instructed on upright positioning w/ head support forward in light of neck collar. ST services will be available for further education as needed while admitted. NSG updated. Palliative Care f/u.  SLP Visit Diagnosis: Dysphagia, unspecified (R13.10)(wearing neck brace for support of )    Aspiration Risk  Mild aspiration risk;Risk for inadequate nutrition/hydration(d/t baseline Dementia, advanced age, neck collar)    Diet Recommendation  Pureed diet consistency; Thin liquids - attempts to use straw d/t restriction of the neck collar; aspiration precautions; feeding support and cues during po's/meals  Medication Administration: Crushed with puree(for easier, safer swallowing)    Other  Recommendations Recommended Consults: (Dietician f/u; likes Ensure) Oral Care Recommendations: Oral care BID;Staff/trained caregiver to provide oral care Other Recommendations: (n/a)   Follow up Recommendations None      Frequency and Duration min 2x/week  1 week       Prognosis Prognosis for Safe Diet Advancement: Fair Barriers to Reach Goals: Cognitive deficits;Severity of deficits      Swallow Study   General Date of Onset: 04/29/18 HPI: Pt is a 84 year old woman who suffered a fall.  She has a known history of Dementia.  Previous medical history also includes hypertension, GERD.  Approximately a little over 24 hours ago she suffered a ground-level fall resulting in neck pain.  She was brought to the emergency room where imaging was obtained demonstrating a fracture of the right C1 facet that extended into the foramen transversarium.  CTA was obtained demonstrating mild compression of the vertebral artery without occlusion or intimal injury.  The patient was placed in a collar and she was admitted.  Currently, pt is resting often; verbally engages w/ family and answers some basic questions but often c/o wanting to "get up and go home", "get up to pee", "leave me alone".  Pt required max  verbal/tactile cues to take the few po trials she accepted b/f appearing too fatigued and falling asleep.   Type of Study: Bedside Swallow Evaluation Previous Swallow Assessment: none Diet Prior to this Study: Regular;Thin liquids Temperature Spikes Noted: (wbc 10.2; temp 99.2) Respiratory Status: Room air History of Recent Intubation: No Behavior/Cognition: Cooperative;Confused;Agitated;Distractible;Requires cueing(Awake but then fatigued) Oral Cavity Assessment: (unable to fully assess d/t Cognitive status) Oral Care Completed by SLP: Recent completion by staff Oral Cavity - Dentition: Dentures, top;Dentures, bottom(partials - not in place) Vision: (n/a) Self-Feeding Abilities: Total assist Patient Positioning: Upright in bed(careful positioning d/t neck collar) Baseline Vocal Quality: Normal(when she spoke) Volitional Cough: Cognitively unable to elicit Volitional Swallow: Unable to elicit    Oral/Motor/Sensory Function Overall Oral Motor/Sensory Function: (appeared [facility] for oral movements for bolus management)   Ice Chips Ice chips: Not tested   Thin Liquid Thin Liquid: Impaired Presentation: Straw(pinched straw strategy used; few sips) Oral Phase Impairments: Poor awareness of bolus(reduced awareness when accepting boluses; max cues) Oral Phase Functional Implications: Prolonged oral transit(intermittent) Pharyngeal  Phase Impairments: (none)    Nectar Thick Nectar Thick Liquid: Not tested   Honey Thick Honey Thick Liquid: Not tested   Puree Puree: Impaired Presentation: Spoon(fed; 6-7 trials) Oral Phase Impairments: Poor awareness of bolus(when accepting boluses from spoon) Oral Phase Functional Implications: Prolonged oral transit(intermittent) Pharyngeal Phase Impairments: (none)   Solid  Solid: Not tested       Orinda Kenner, MS, CCC-SLP Watson,Katherine 04/30/2018,4:18 PM

## 2018-04-30 NOTE — Plan of Care (Signed)
Patient is no longer complaining of neck or head pain since cervical collar has been replaced.

## 2018-04-30 NOTE — Progress Notes (Signed)
Anticoagulation monitoring(Lovenox):  84yo  female ordered enoxparin 30 mg Q24h for DVT prevention.  Filed Weights   04/29/18 0453 04/29/18 1811  Weight: 101 lb (45.8 kg) 132 lb (59.9 kg)   BMI     Lab Results  Component Value Date   CREATININE 0.84 04/30/2018   CREATININE 0.85 04/29/2018   CREATININE 0.81 01/25/2018   Estimated Creatinine Clearance: 31 mL/min (by C-G formula based on SCr of 0.84 mg/dL). Hemoglobin & Hematocrit     Component Value Date/Time   HGB 10.6 (L) 04/30/2018 0410   HGB 11.2 (L) 08/09/2014 0324   HCT 30.8 (L) 04/30/2018 0410   HCT 34.3 (L) 08/09/2014 0324     Per Protocol for Patient with estCrcl > 30 ml/min and BMI < 40, will transition to Lovenox 40 mg Q24h.     MLS 04/30/2018 2:05 PM

## 2018-04-30 NOTE — Progress Notes (Signed)
Patient has the aspen collar applied. She tolerated well.

## 2018-04-30 NOTE — Progress Notes (Signed)
PMT consult received and chart reviewed. This NP met with patient's daughter and two sons in conference room. Family has a good understanding of diagnoses, interventions, and guarded prognosis after traumatic fall and with baseline dementia. Family confirms focus on comfort and pain management. They understand she will likely not tolerate aggressive physical therapy and return to previous baseline. Also discussed concern with nutritional status. Introduced outpatient hospice options. Family tearful and trying to process information provided. Family requests more time to further discuss with other sister. Provided private duty caregiver list for possible return home with hospice services. Also discussed hospice facility. PMT will f/u with family tomorrow.   Full note to follow.   NO CHARGE  Ihor Dow, FNP-C Palliative Medicine Team  Phone: [phone] Fax: [phone]

## 2018-04-30 NOTE — Plan of Care (Signed)
  Problem: Clinical Measurements: Goal: Ability to maintain clinical measurements within normal limits will improve Outcome: Progressing Goal: Will remain free from infection Outcome: Progressing Goal: Diagnostic test results will improve Outcome: Progressing   Problem: Elimination: Goal: Will not experience complications related to urinary retention Outcome: Progressing   Problem: Safety: Goal: Ability to remain free from injury will improve Outcome: Progressing   Problem: Skin Integrity: Goal: Risk for impaired skin integrity will decrease Outcome: Progressing

## 2018-04-30 NOTE — Progress Notes (Signed)
[facility] [place] NAME: Heidi [name]    MR#:  [id number]  DATE OF BIRTH:  [birth year]  SUBJECTIVE:  CHIEF COMPLAINT:   Chief Complaint  Patient presents with  . Fall  . Neck Pain  sleepy. Family at bedside REVIEW OF SYSTEMS:  Review of Systems  Unable to perform ROS: Dementia    DRUG ALLERGIES:   Allergies  Allergen Reactions  . Anhydrous Base Other (See Comments)  . Aspirin   . Carbapenems Other (See Comments)  . Cephalosporins Other (See Comments)  . Levaquin [Levofloxacin In D5w]     Questionable rash   . Penicillins     Has patient had a PCN reaction causing immediate rash, facial/tongue/throat swelling, SOB or lightheadedness with hypotension: No Has patient had a PCN reaction causing severe rash involving mucus membranes or skin necrosis: No Has patient had a PCN reaction that required hospitalization: No Has patient had a PCN reaction occurring within the last 10 years: No If all of the above answers are "NO", then may proceed with Cephalosporin use.   . Sulfa Antibiotics   . Sulfonylureas Other (See Comments)  . Thiazide-Type Diuretics Other (See Comments)  . Tramadol Other (See Comments)    Dizziness   . Macrobid [Nitrofurantoin Monohyd Macro] Rash  . Omnicef [Cefdinir] Rash   VITALS:  Blood pressure 135/64, pulse (!) 102, temperature 99 F (37.2 C), temperature source Oral, resp. rate 18, height 5' (1.524 m), weight 59.9 kg, SpO2 93 %. PHYSICAL EXAMINATION:  Physical Exam  Constitutional: She appears lethargic. She appears cachectic. She is sleeping and uncooperative. She appears ill. Cervical collar in place.  HENT:  Head: Normocephalic and atraumatic.  Eyes: Pupils are equal, round, and reactive to light. Conjunctivae and EOM are normal.  Neck: Normal range of motion. Neck supple. No tracheal deviation present. No thyromegaly present.  Cardiovascular: Normal rate, regular rhythm and normal heart  sounds.  Pulmonary/Chest: Effort normal and breath sounds normal. No respiratory distress. She has no wheezes. She exhibits no tenderness.  Abdominal: Soft. Bowel sounds are normal. She exhibits no distension. There is no tenderness.  Musculoskeletal: Normal range of motion.  Neurological: She appears lethargic. She is disoriented. No cranial nerve deficit.  Skin: Skin is warm and dry. No rash noted.   LABORATORY PANEL:  Female CBC Recent Labs  Lab 04/30/18 0410  WBC 10.2  HGB 10.6*  HCT 30.8*  PLT 167   ------------------------------------------------------------------------------------------------------------------ Chemistries  Recent Labs  Lab 04/29/18 0612 04/30/18 0410  NA 141 140  K 3.8 4.0  CL 106 108  CO2 26 26  GLUCOSE 101* 110*  BUN 24* 23  CREATININE 0.85 0.84  CALCIUM 10.0 8.8*  AST 24  --   ALT 15  --   ALKPHOS 46  --   BILITOT 0.6  --    RADIOLOGY:  No results found. ASSESSMENT AND PLAN:  84 year old woman with K/H/O  hypertension, GERD, dementia who suffered a fall.    * Right C1 lateral mass fracture - conservative mgmt per Neurosurgery - cervical collar at all times  * HTN/Tachycardia - continue norvasc, metoprolol and cozaar  * Agitation - continue haldol and Remeron  * Dementia - Await Palliative care eval   Consider Hospice if qualifies - Hospice Home?     All the records are reviewed and case discussed with Care Management/Social Worker. Management plans discussed with the patient, family (daughter, son) and they are in agreement.  CODE  STATUS: DNR  TOTAL TIME TAKING CARE OF THIS PATIENT: 35 minutes.   More than 50% of the time was spent in counseling/coordination of care: YES  POSSIBLE D/C IN 1-2 DAYS, DEPENDING ON CLINICAL CONDITION.   Max Sane M.D on 04/30/2018 at 3:50 PM  Between 7am to 6pm - Pager - [phone]  After 6pm go to [email or web address] - Proofreader  [facility]  Office   [phone]  CC: Primary care physician; Einar Pheasant, MD  Note: This dictation was prepared with Dragon dictation along with smaller phrase technology. Any transcriptional errors that result from this process are unintentional.

## 2018-04-30 NOTE — Consult Note (Signed)
NEUROSURGERY INPATIENT CONSULT  ZAKARIA FROMER [birth year] [id number] 04/29/2018 1  Date of Service:  04/30/2018  Patient Care Team: Einar Pheasant, MD as PCP - General (Internal Medicine)  Chief Complaint: Neck pain  History of Present Illness: Neurosurgery is consulted for evaluation of a 84 year old woman who suffered a fall.  She has a known history of dementia.  Previous medical history also includes hypertension, GERD.  Approximately a little over 24 hours ago she suffered a ground-level fall resulting in neck pain.  She was brought to the emergency room where imaging was obtained demonstrating a fracture of the right C1 facet that extended into the foramen transversarium.  CTA was obtained demonstrating mild compression of the vertebral artery without occlusion or intimal injury.  The patient was placed in a collar and she was admitted.  This morning the patient's daughter is at the bedside.  The patient is confused.  She denies pain however she recently received a dose of morphine.  The daughter relates that they have been caring for her mother at home.  No active medical issues according to the daughter other than the mother's dementia, recurrent falls, and current C-spine fracture.  Past Medical History  Patient Active Problem List   Diagnosis Date Noted  . C1 cervical fracture ([place]) 04/30/2018    Priority: High  . Fall from ground level 04/29/2018  . DNR (do not resuscitate) discussion 02/12/2018  . Unsteady gait 02/12/2018  . Weakness 11/19/2016  . Skin lesion of cheek 04/10/2015  . Gas 04/10/2015  . Loss of weight 04/08/2015  . Rib pain on right side 08/16/2014  . Shoulder pain 08/16/2014  . Lower extremity edema 01/20/2014  . Leg pain 01/20/2014  . Rash 09/12/2013  . Urinary frequency 09/12/2013  . Headache 02/19/2013  . Dizziness 02/19/2013  . Inflammatory polyarthropathy ([place]) 08/16/2012  . Osteoarthritis 08/16/2012  . Osteoporosis 08/16/2012  . Anemia  08/16/2012  . GERD (gastroesophageal reflux disease) 08/16/2012  . Hypertension 08/16/2012    Past Medical History:  Diagnosis Date  . Anemia   . Asthma   . GERD (gastroesophageal reflux disease)   . Hypertension   . Inflammatory arthritis    elevated ESR, negative temporal artery bx, low titer rheumatoid factor  . Nephrolithiasis   . OA (osteoarthritis)    Left knee replacement, hands, shoulders, cervial spine  . Osteoporosis    vitamin D deficiency, nasal miacalcin, reclast, previous rib fracture  . Pancreatitis    s/p cholecystectomy with ERCP and stone extraction  . Vitamin D deficiency      Past Surgical History:  Procedure Laterality Date  . ABDOMINAL HYSTERECTOMY     secondary  to bleeding  . LITHOTRIPSY    . REPLACEMENT TOTAL KNEE     Left knee replacement     '@CURRENT' @  Allergies  Allergen Reactions  . Anhydrous Base Other (See Comments)  . Aspirin   . Carbapenems Other (See Comments)  . Cephalosporins Other (See Comments)  . Levaquin [Levofloxacin In D5w]     Questionable rash   . Penicillins     Has patient had a PCN reaction causing immediate rash, facial/tongue/throat swelling, SOB or lightheadedness with hypotension: No Has patient had a PCN reaction causing severe rash involving mucus membranes or skin necrosis: No Has patient had a PCN reaction that required hospitalization: No Has patient had a PCN reaction occurring within the last 10 years: No If all of the above answers are "NO", then may proceed with Cephalosporin use.   Marland Kitchen  Sulfa Antibiotics   . Sulfonylureas Other (See Comments)  . Thiazide-Type Diuretics Other (See Comments)  . Tramadol Other (See Comments)    Dizziness   . Macrobid [Nitrofurantoin Monohyd Macro] Rash  . Omnicef [Cefdinir] Rash     Social History   Tobacco Use  . Smoking status: Never Smoker  . Smokeless tobacco: Never Used  Substance Use Topics  . Alcohol use: No    Alcohol/week: 0.0 standard drinks      Family History  Problem Relation Age of Onset  . Hypertension Mother   . Stroke Mother   . Stomach cancer Father   . Arthritis Brother   . Lung cancer Brother      ROS: Not obtainable due to confusion   EXAMINATION  VITALS: BP 135/64 (BP Location: Left Arm)   Pulse (!) 102   Temp 99 F (37.2 C) (Oral)   Resp 18   Ht 5' (1.524 m)   Wt 59.9 kg   SpO2 93%   BMI 25.78 kg/m    Vitals Current Average / Min / Max     Temp    99 F (37.2 C)    Temp  Min: 98.6 F (37 C)  Max: 99.2 F (37.3 C)     BP     135/64     BP  Min: 124/62  Max: 190/95     HR    (!) 102    Pulse  Avg: 90.5  Min: 56  Max: 124     RR    18    Resp  Avg: 18.1  Min: 14  Max: 23     Sats    93 %    SpO2  Min: 89 %  Max: 99 %     Weight    59.9 kg    Admit: 45.8 kg    Intake/Output Summary (Last 24 hours) at 04/30/2018 0806 Last data filed at 04/30/2018 5361 Gross per 24 hour  Intake 609.17 ml  Output 400 ml  Net 209.17 ml    GENERAL: Elderly frail-appearing woman   MUSCULOSKELETAL:  Tender to palpation of the occipital cervical junction     HIGHER INTEGRATIVE FUNCTIONS:  WER:[id number] to person only   CRANIAL NERVES: Pupils are equal and reactive The face appears symmetric The tongue is midline with protrusion   STRENGTH:  The patient does not completely participate with the exam however she appears to be full strength.  She moves all 4 extremities and wiggles toes to command.  SENSATION Difficult to assess given patient's limited participation with question asking  LABORATORY: Lab Results  Component Value Date   NA 140 04/30/2018   K 4.0 04/30/2018   CL 108 04/30/2018   WBC 10.2 04/30/2018   HGB 10.6 (L) 04/30/2018   HCT 30.8 (L) 04/30/2018   INR 0.92 04/29/2018     IMAGING:  Head CT was reviewed.  This shows a cephalohematoma.  There is no intracranial hemorrhage.  There is diffuse atrophy.  CT of the C-spine was reviewed.  There is a comminuted fracture involving the  right lateral mass of C1 which extends to the foramen transversarium.  CTA evaluation shows approximately 40 to 50% compression of the right vertebral artery.  There is no evidence of intimal injury.  There is still flow.  The left vertebral artery remains patent.   ASSESSMENT AND PLAN: 84 year old woman status post fall with resultant right C1 lateral mass fracture.  The nature, risks, and alternatives of the  various management options were reviewed with the patient's daughter.  Given her age and frail status, I would not recommend surgical intervention which would require occipital to cervical fusion.  I do not think the patient would be able to withstand the morbidity and mortality risks associated with this procedure.  We will manage the patient's fracture expectantly.  We will place her in a cervical collar.  She is to wear the cervical collar at all times.  She may be mobilized with the assistance of physical therapy and Occupational Therapy.  Once she has been mobilized, we will obtain standing AP and lateral as well as open-mouth cervical radiographs.  We will also attempt to order a bone stimulator to aid in her fracture healing.  ACTIVE PROBLEMS: Active Problems:   C1 cervical fracture ([place])   Fall from ground level    This note was dictated using voice recognition software.  Please contact me if there are any questions regarding its content.

## 2018-04-30 NOTE — Progress Notes (Signed)
Initial Nutrition Assessment  DOCUMENTATION CODES:   Severe malnutrition in context of chronic illness  INTERVENTION:   Oscal with vitamin D po BID  MVI daily  Magic cup TID with meals, each supplement provides 290 kcal and 9 grams of protein  RD will order supplements pending SLP evaluation   NUTRITION DIAGNOSIS:   Severe Malnutrition related to chronic illness(dementia, advanced age ) as evidenced by severe fat depletion, severe muscle depletion.  GOAL:   Patient will meet greater than or equal to 90% of their needs  MONITOR:   PO intake, Supplement acceptance, Labs, Weight trends, Skin, I & O's  REASON FOR ASSESSMENT:   Malnutrition Screening Tool    ASSESSMENT:   84 year old woman with dementia status post fall with resultant right C1 lateral mass fracture.  Visited pt's room today; unable to obtain nutrition related history from pt r/t dementia so history obtained from family members at bedside. Per pt's family at bedside, pt with fair appetite and oral intake at baseline. Family reports "she does pretty good for her age". Pt does drink some Ensure at home. Pt eats mainly soft food items such as soups, applesauce and ice cream at home. Patient complaining today that she is having difficulty swallowing. Pt refusing to eat or drink anything except for applesauce. RD is unsure if this is related to cervical pain or if this is an ongoing issue prior to admit. Family reports that pt does not usually complain of trouble swallowing at home. Spoke to MD, will plan to get an SLP evaluation today. RD will order supplements pending SLP evaluation. Per chart, pt appears weight stable pta. Admit weight significantly higher than pt's UBW; suspect this weight is not correct. Notified RN of inaccurate weight in pt's chart. Pt with history of osteoporosis and multiple fractures; recommend continue Oscal after discharge.   Medications reviewed and include: oscal with D, vitamin D, lovenox,  remeron, MVI, vitamin C, Vitamin E  Labs reviewed: Hgb 10.6(L), Hct 30.8(L)  NUTRITION - FOCUSED PHYSICAL EXAM:    Most Recent Value  Orbital Region  Mild depletion  Upper Arm Region  Severe depletion  Thoracic and Lumbar Region  Severe depletion  Buccal Region  Moderate depletion  Temple Region  Moderate depletion  Clavicle Bone Region  Severe depletion  Clavicle and Acromion Bone Region  Severe depletion  Scapular Bone Region  Severe depletion  Dorsal Hand  Severe depletion  Patellar Region  Severe depletion  Anterior Thigh Region  Severe depletion  Posterior Calf Region  Severe depletion  Edema (RD Assessment)  None  Hair  Reviewed  Eyes  Reviewed  Mouth  Reviewed  Skin  Reviewed  Nails  Reviewed     Diet Order:   Diet Order            Diet regular Room service appropriate? Yes; Fluid consistency: Thin  Diet effective now             EDUCATION NEEDS:   Education needs have been addressed(with pt's family )  Skin:  Skin Assessment: Reviewed RN Assessment(ecchymosis )  Last BM:  8/18  Height:   Ht Readings from Last 1 Encounters:  04/29/18 5' (1.524 m)    Weight:   Wt Readings from Last 1 Encounters:  04/29/18 59.9 kg    Ideal Body Weight:  45.45 kg  BMI:  Body mass index is 25.78 kg/m.  Estimated Nutritional Needs:   Kcal:  1100-1300kcal/day   Protein:  65-75g/day   Fluid:  >  1.1L/day   Koleen Distance MS, RD, LDN Pager #- [phone] Office#- [phone] After Hours Pager: [phone]

## 2018-04-30 NOTE — Progress Notes (Signed)
Called Legrand Como, for Aspen collar for patient. She verbalized that the collas is going to be here within 2 hours.

## 2018-05-01 DIAGNOSIS — F0390 Unspecified dementia without behavioral disturbance: Secondary | ICD-10-CM

## 2018-05-01 DIAGNOSIS — Z515 Encounter for palliative care: Secondary | ICD-10-CM

## 2018-05-01 NOTE — Progress Notes (Addendum)
[facility] [place] NAME: Heidi [name]    MR#:  [id number]  DATE OF BIRTH:  04-28-1918  SUBJECTIVE: Admitted for fall, C1 fracture and has cervical collar now, not eating unable to eat because of her cervical collar.  Spoke with patient's son, he mentioned that he wants her to go to hospice home but would like to talk to his sisters as well.  CHIEF COMPLAINT:   Chief Complaint  Patient presents with  . Fall  . Neck Pain  Status post fall, neck pain, admitted for C1 fracture.  Son mentioned that the main focuse on comfort. REVIEW OF SYSTEMS:  Review of Systems  Unable to perform ROS: Dementia    DRUG ALLERGIES:   Allergies  Allergen Reactions  . Anhydrous Base Other (See Comments)  . Aspirin   . Carbapenems Other (See Comments)  . Cephalosporins Other (See Comments)  . Levaquin [Levofloxacin In D5w]     Questionable rash   . Penicillins     Has patient had a PCN reaction causing immediate rash, facial/tongue/throat swelling, SOB or lightheadedness with hypotension: No Has patient had a PCN reaction causing severe rash involving mucus membranes or skin necrosis: No Has patient had a PCN reaction that required hospitalization: No Has patient had a PCN reaction occurring within the last 10 years: No If all of the above answers are "NO", then may proceed with Cephalosporin use.   . Sulfa Antibiotics   . Sulfonylureas Other (See Comments)  . Thiazide-Type Diuretics Other (See Comments)  . Tramadol Other (See Comments)    Dizziness   . Macrobid [Nitrofurantoin Monohyd Macro] Rash  . Omnicef [Cefdinir] Rash   VITALS:  Blood pressure (!) 120/57, pulse (!) 107, temperature 99.3 F (37.4 C), temperature source Oral, resp. rate 16, height 5' (1.524 m), weight 59.9 kg, SpO2 92 %. PHYSICAL EXAMINATION:  Physical Exam  Constitutional: She appears lethargic. She appears cachectic. She is sleeping and uncooperative. She appears ill.  Cervical collar in place.  HENT:  Head: Normocephalic and atraumatic.  Eyes: Pupils are equal, round, and reactive to light. Conjunctivae and EOM are normal.  Neck: Normal range of motion. Neck supple. No tracheal deviation present. No thyromegaly present.    Cardiovascular: Normal rate, regular rhythm and normal heart sounds.  Pulmonary/Chest: Effort normal and breath sounds normal. No respiratory distress. She has no wheezes. She exhibits no tenderness.  Abdominal: Soft. Bowel sounds are normal. She exhibits no distension. There is no tenderness.  Musculoskeletal: Normal range of motion.  Neurological: She appears lethargic. She is disoriented. No cranial nerve deficit.  Skin: Skin is warm and dry. No rash noted.   LABORATORY PANEL:  Female CBC Recent Labs  Lab 04/30/18 0410  WBC 10.2  HGB 10.6*  HCT 30.8*  PLT 167   ------------------------------------------------------------------------------------------------------------------ Chemistries  Recent Labs  Lab 04/29/18 0612 04/30/18 0410  NA 141 140  K 3.8 4.0  CL 106 108  CO2 26 26  GLUCOSE 101* 110*  BUN 24* 23  CREATININE 0.85 0.84  CALCIUM 10.0 8.8*  AST 24  --   ALT 15  --   ALKPHOS 46  --   BILITOT 0.6  --    RADIOLOGY:  No results found. ASSESSMENT AND PLAN:  84 year old woman with K/H/O  hypertension, GERD, dementia who suffered a fall.    * Right C1 lateral mass fracture - conservative mgmt per Neurosurgery - cervical collar at all times Unable to eat  because of cervical collar, patient condition likely to deteriorate if continues to have poor p.o. intake, secondary to advanced age, cervical fracture he told me that family main focus is on comfort.   Discussed with patient's son leaning forward to hospice home.  Appreciate palliative care seeing the patient and meeting with family again today. * HTN/Tachycardia - continue norvasc, metoprolol and cozaar  * Agitation - continue haldol and Remeron  *  Dementia Seen by palliative care, CODE STATUS DNR, likely discharge to hospice home if all the family members agree. Discussed with patient's son Heidi [name]. Spoke with palliative care nurse Heidi [name].  All the records are reviewed and case discussed with Care Management/Social Worker. Management plans discussed with the patient, family (daughter, son) and they are in agreement.  CODE STATUS: DNR  TOTAL TIME TAKING CARE OF THIS PATIENT: 35 minutes.   More than 50% of the time was spent in counseling/coordination of care: YES  POSSIBLE D/C IN 1-2 DAYS, DEPENDING ON CLINICAL CONDITION.   Heidi [name] M.D on 05/01/2018 at 11:15 AM  Between 7am to 6pm - Pager - [phone]  After 6pm go to [email or web address] - Proofreader  [facility]  Office  [phone]  CC: Primary care physician; Einar Pheasant, MD  Note: This dictation was prepared with Dragon dictation along with smaller phrase technology. Any transcriptional errors that result from this process are unintentional.

## 2018-05-01 NOTE — Progress Notes (Signed)
RN attempted to feed pt. Pt lethargic and unable to follow commands.  Pt open her mouth slightly, unable to chew, holds food. Tolerated few small bites of ice cream only. Not safe to give meds at this time. Dr Vianne Bulls notified of the above. No new orders.

## 2018-05-01 NOTE — Progress Notes (Signed)
Daily Progress Note   Heidi [name] Name: Heidi Heidi [name]       Date: 05/01/2018 DOB: 07-22-1918  Age: 84 y.o. MRN#: [id number] Attending Physician: Epifanio Lesches, MD Primary Care Physician: Einar Pheasant, MD Admit Date: 04/29/2018  Reason for Consultation/Follow-up: Establishing goals of care and Terminal Care  Subjective/GOC: Upon arrival to room, Heidi [name] awake and smiling with family. She denies pain. She takes bites of magic cup for daughter. She does not actively participate in conversation but does make a few comments throughout my visit including jokes with family at bedside.   Palliative f/u with son Heidi Heidi [name]) and daughter Heidi Heidi [name]) at bedside. Other family members present also.   Family is encouraged by the fact that she is more awake, alert, and accepting bites today. Although seeing this, Heidi Heidi [name] and Heidi Heidi [name] still do not feel it is wise to push therapy and know she will not tolerate going to a rehab facility after hospitalization.   Again reviewed hospital diagnoses and interventions. Discussed disease trajectory of dementia. Heidi Heidi [name] is daughter that is most involved in her care at home (with her Monday through Friday). Also the Heidi [name] has 24/7 caregiver through the [facility]. Heidi Heidi [name] speaks of her declining health and worsening progression of dementia in the last month with weight loss and declining functional and nutritional status. Many medications (vitamins) have been discontinued by PCP. Heidi Heidi [name] even shares that they were considering hospice service prior to fall.    Discussed hospice philosophy and options including focus on comfort, quality, dignity at EOL. Educated on utilizing medications as needed to maintain comfort and relief from suffering. Discussed  comfort feeds. Heidi Heidi [name] and Heidi Heidi [name] wish to pursue home with hospice services and wish to start the referral process today.   Provided living will and copy placed into chart. Reviewed MOST form with Heidi Heidi [name] and encouraged her to complete with me if ready.   Length of Stay: 2  Current Medications: Scheduled Meds:  . amLODipine  5 mg Oral Daily  . enoxaparin (LOVENOX) injection  40 mg Subcutaneous Q24H  . feeding supplement (ENSURE ENLIVE)  237 mL Oral BID BM  . losartan  50 mg Oral Daily  . metoprolol succinate  12.5 mg Oral Daily  . mirtazapine  7.5 mg Oral QHS  . montelukast  10 mg Oral Daily  . sodium chloride flush  3 mL Intravenous Q12H    Continuous Infusions:   PRN Meds: acetaminophen **OR** acetaminophen, bisacodyl, morphine injection, ondansetron **OR** ondansetron (ZOFRAN) IV, senna-docusate  Physical Exam  Constitutional: She is cooperative.  HENT:  Head: Normocephalic and atraumatic.  Cardiovascular: Regular rhythm.  Pulmonary/Chest: No accessory muscle usage. No tachypnea. No respiratory distress.  Musculoskeletal:  Cervical collar  Neurological: She is alert.  Alert, pleasantly confused with baseline dementia.  Skin: Skin is warm and dry.  Psychiatric: Her speech is delayed. Cognition and memory are impaired. She is inattentive.  Nursing note and vitals reviewed.          Vital Signs: BP (!) 120/57   Pulse (!) 107   Temp 99.3 F (37.4 C) (Oral)   Resp 16   Ht 5' (1.524 m)   Wt 59.9 kg   SpO2 92%   BMI 25.78 kg/m  SpO2: SpO2: 92 % O2 Device: O2 Device: Room Air O2 Flow Rate:    Intake/output summary:   Intake/Output Summary (Last 24 hours) at 05/01/2018 1335 Last data filed at 05/01/2018 3716 Gross per 24 hour  Intake -  Output 350 ml  Net -350 ml   LBM: Last BM Date: 04/28/18 Baseline Weight: Weight: 45.8 kg Most recent weight: Weight: 59.9 kg       Palliative Assessment/Data: PPS 20%   Flowsheet Rows     Most Recent Value  Intake  Tab  Referral Department  Hospitalist  Unit at Time of Referral  Med/Surg Unit  Palliative Care Primary Diagnosis  Neurology  Palliative Care Type  New Palliative care  Reason for referral  Clarify Goals of Care  Date first seen by Palliative Care  04/30/18  Clinical Assessment  Palliative Performance Scale Score  20%  Psychosocial & Spiritual Assessment  Palliative Care Outcomes  Heidi [name]/Family meeting held?  Yes  Who was at the meeting?  daughter, two sons  Palliative Care Outcomes  Clarified goals of care, Counseled regarding hospice, Provided psychosocial or spiritual support, ACP counseling assistance, Provided end of life care assistance, Changed to focus on comfort, Improved pain interventions, Improved non-pain symptom therapy      Heidi [name] Active Problem List   Diagnosis Date Noted  . Palliative care by specialist   . Dementia without behavioral disturbance   . C1 cervical fracture ([place]) 04/30/2018  . Protein-calorie malnutrition, severe 04/30/2018  . Fall from ground level 04/29/2018  . Goals of care, counseling/discussion 02/12/2018  . Unsteady gait 02/12/2018  . Weakness 11/19/2016  . Skin lesion of cheek 04/10/2015  . Gas 04/10/2015  . Loss of weight 04/08/2015  . Rib pain on right side 08/16/2014  . Shoulder pain 08/16/2014  . Lower extremity edema 01/20/2014  . Leg pain 01/20/2014  . Rash 09/12/2013  . Urinary frequency 09/12/2013  . Headache 02/19/2013  . Dizziness 02/19/2013  . Inflammatory polyarthropathy ([place]) 08/16/2012  . Osteoarthritis 08/16/2012  . Osteoporosis 08/16/2012  . Anemia 08/16/2012  . GERD (gastroesophageal reflux disease) 08/16/2012  . Hypertension 08/16/2012    Palliative Care Assessment & Plan   Heidi [name] Profile: 84 y.o. female  with past medical history of dementia, osteoarthritis, hypertension, GERD, anemia, vitamin D deficiency, and pancreatitis admitted on 04/29/2018 after fall and neck pain from home. Imagining revealed fracture  of right C1 facet that extends into foramen transversarium. CTA reveals mild compression of the vertebral artery without occlusion or injury. Cervical collar placed. Heidi [name] is not a surgical candidate secondary to age, frailty, and dementia. Palliative medicine consultation for goals  of care.   Assessment: Dementia Fall Cervical fracture Hypertension  Recommendations/Plan:  DNR/DNI. Continue supportive care and current interventions.   Living will copy placed in chart. Daughters Heidi Heidi [name] and [place]) are documented HCPOA's. Heidi Heidi [name] lives with Heidi [name] and most involved in her care. All 4 of Heidi [name]'s children are involved in decision making.   F/u with Ardis Hughs at bedside. Although Heidi [name] is more awake, alert, and taking bites today, family declining PT/OT evaluations. They understand she will likely not do well with aggressive therapy. They wish to focus on her comfort and quality of life.   Comfort feeds. Heidi [name] requires assist and encouragement.   Heidi [name] has 24/7 caregivers in the home setting. Family requesting to return home with hospice services on discharge. RN CM consult placed.  Discontinued home vitamins on MAR. Per daughter, these medications were recently discontinued by PCP.  Goals of Care and Additional Recommendations:  Continue supportive care. Focus on comfort.   Code Status: DNR/DNI  Code Status Orders  (From admission, onward)         Start     Ordered   04/29/18 1816  Do not attempt resuscitation (DNR)  Continuous    Question Answer Comment  In the event of cardiac or respiratory ARREST Do not call a "code blue"   In the event of cardiac or respiratory ARREST Do not perform Intubation, CPR, defibrillation or ACLS   In the event of cardiac or respiratory ARREST Use medication by any route, position, wound care, and other measures to relive pain and suffering. May use oxygen, suction and manual treatment of airway obstruction as needed for  comfort.      04/29/18 1815        Code Status History    This Heidi [name] has a current code status but no historical code status.    Advance Directive Documentation     Most Recent Value  Type of Advance Directive  Living will  Pre-existing out of facility DNR order (yellow form or pink MOST form)  -  "MOST" Form in Place?  -       Prognosis:   Poor prognosis secondary to fall, cervical fracture not a surgical candidate, and baseline dementia with declining functional, cognitive, and nutritional status. Likely weeks-months, especially if her nutritional status remains poor.   Discharge Planning:  Home with Hospice  Care plan was discussed with daughter, son, multiple family members at bedside, Dr. Vianne Bulls  Thank you for allowing the Palliative Medicine Team to assist in the care of this Heidi [name].   Time In: 1230 Time Out: 1340 Total Time 28min Prolonged Time Billed  yes      Greater than 50%  of this time was spent counseling and coordinating care related to the above assessment and plan.  Ihor Dow, FNP-C Palliative Medicine Team  Phone: [phone] Fax: [phone]  Please contact Palliative Medicine Team phone at [phone] for questions and concerns.

## 2018-05-02 ENCOUNTER — Telehealth: Payer: Self-pay

## 2018-05-02 DIAGNOSIS — Z9189 Other specified personal risk factors, not elsewhere classified: Secondary | ICD-10-CM

## 2018-05-02 DIAGNOSIS — K117 Disturbances of salivary secretion: Secondary | ICD-10-CM

## 2018-05-02 MED ORDER — SCOPOLAMINE 1 MG/3DAYS TD PT72
1.0000 | MEDICATED_PATCH | TRANSDERMAL | Status: DC
  Administered 2018-05-02: 13:00:00 1.5 mg via TRANSDERMAL
  Filled 2018-05-02: qty 1

## 2018-05-02 MED ORDER — ENOXAPARIN SODIUM 30 MG/0.3ML ~~LOC~~ SOLN
30.0000 mg | SUBCUTANEOUS | Status: DC
  Administered 2018-05-02: 30 mg via SUBCUTANEOUS
  Filled 2018-05-02: qty 0.3

## 2018-05-02 MED ORDER — ORAL CARE MOUTH RINSE
15.0000 mL | Freq: Two times a day (BID) | OROMUCOSAL | Status: DC
  Administered 2018-05-02 (×2): 15 mL via OROMUCOSAL

## 2018-05-02 NOTE — Progress Notes (Signed)
New referral for [facility] services at home received from [place] following a Palliative Medicine consult. Patient is a 84 year old woman admitted from home following a fall. CT of the neck in the ED revealed a comminuted fracture of R lateral mass of C1 extending into the  vertebral foramen. LAbs revealed dehydration and hyperglycemia. Neurosurgery was consulted and no surgical intervention planned/available. Patient was placed in a soft collar and admitted for IV hydration and pain management. Palliative Medicine was consulted for goals of care and met over the past few days with her family. They have chosen for patient to return to her home with the support hospice services.  Writer met in the family room with patient's daughters Lib and [name] as well as [name]'s husband to initiate education regarding hospice services, philosophy and team approach to care with understanding voiced. Plan is for patient to have increased care giver support at home. Lib does live with her. No DME needs at this time. Patient has been receiving IV morphine 2 mg for pain. Will request liquid morphine rx at discharge. Family in agreement. Patient information faxed to referral. Thank you for the opportunity to be involved in the care of this patient and her family. Flo Shanks RN, BSN, [place] [facility] [place], [facility] [phone]

## 2018-05-02 NOTE — Telephone Encounter (Signed)
Copied from [place] [id number]. Topic: Referral - Question >> May 02, 2018  4:34 PM Vernona Rieger wrote: Reason for CRM: Langley Gauss from hosp[facility] /[facility] said that [facility] reached out to them for her to have hospice services when she discharges from the hospital. She would like to know if Dr Nicki Reaper would be the attending physician for her?  Call back # [phone]

## 2018-05-02 NOTE — Plan of Care (Signed)
Pt lethargic. Unable to give meds during the shift nor any PO intake. Morphine given once for L arm pain. Pt slept during the shift.

## 2018-05-02 NOTE — Progress Notes (Signed)
Daily Progress Note   Patient Name: Heidi [name]       Date: 05/02/2018 DOB: 1918-01-16  Age: 84 y.o. MRN#: [id number] Attending Physician: Epifanio Lesches, MD Primary Care Physician: Einar Pheasant, MD Admit Date: 04/29/2018  Reason for Consultation/Follow-up: Establishing goals of care and Terminal Care  Subjective/GOC: Patient awake, alert, pleasantly confused. No s/s of pain or discomfort. Congestion. Likely aspirating when taking bites of food.   Daughter, Levander Campion at bedside. She speaks of her mother being "chatty" throughout the night and unable to sleep. She shares that she was laughing till her stomach hurt. Dianne does not believe she was in pain throughout the night. Discussed course of hospitalization again and plan for home with hospice services. Levander Campion is worried about her congestion today. I explained that she is likely aspirating even with bites and sips. Explained role of comfort feeds and accepting risk for aspiration, knowing it will give her joy to eat/drink at EOL. Dianne agreeable with me adding medication for secretions. Discussed other comfort medications. Discussed role of hospice in the home setting. Emotional support provided. Therapeutic listening as Levander Campion shared stories of her mother.   Other daughter, Lib, will be here this afternoon to meet with hospice liaison.   Length of Stay: 3  Current Medications: Scheduled Meds:  . amLODipine  5 mg Oral Daily  . enoxaparin (LOVENOX) injection  40 mg Subcutaneous Q24H  . feeding supplement (ENSURE ENLIVE)  237 mL Oral BID BM  . losartan  50 mg Oral Daily  . metoprolol succinate  12.5 mg Oral Daily  . mirtazapine  7.5 mg Oral QHS  . montelukast  10 mg Oral Daily  . scopolamine  1 patch Transdermal Q72H  .  sodium chloride flush  3 mL Intravenous Q12H    Continuous Infusions:   PRN Meds: acetaminophen **OR** acetaminophen, bisacodyl, morphine injection, ondansetron **OR** ondansetron (ZOFRAN) IV, senna-docusate  Physical Exam  Constitutional: She is cooperative.  HENT:  Head: Normocephalic and atraumatic.  Cardiovascular: Regular rhythm.  Pulmonary/Chest: No accessory muscle usage. No tachypnea. No respiratory distress.  Musculoskeletal:  Cervical collar  Neurological: She is alert.  Alert, pleasantly confused with baseline dementia.  Skin: Skin is warm and dry.  Psychiatric: Her speech is delayed. Cognition and memory are impaired. She is inattentive.  Nursing note and vitals reviewed.  Vital Signs: BP (!) 122/52 (BP Location: Left Leg)   Pulse (!) 104   Temp (!) 97.5 F (36.4 C) (Oral)   Resp (!) 22   Ht 5' (1.524 m)   Wt 59.9 kg   SpO2 96%   BMI 25.78 kg/m  SpO2: SpO2: 96 % O2 Device: O2 Device: Room Air O2 Flow Rate:    Intake/output summary:  No intake or output data in the 24 hours ending 05/02/18 0940 LBM: Last BM Date: 05/01/18 Baseline Weight: Weight: 45.8 kg Most recent weight: Weight: 59.9 kg       Palliative Assessment/Data: PPS 20%   Flowsheet Rows     Most Recent Value  Intake Tab  Referral Department  Hospitalist  Unit at Time of Referral  Med/Surg Unit  Palliative Care Primary Diagnosis  Neurology  Palliative Care Type  New Palliative care  Reason for referral  Clarify Goals of Care  Date first seen by Palliative Care  04/30/18  Clinical Assessment  Palliative Performance Scale Score  20%  Psychosocial & Spiritual Assessment  Palliative Care Outcomes  Patient/Family meeting held?  Yes  Who was at the meeting?  daughter, two sons  Palliative Care Outcomes  Clarified goals of care, Counseled regarding hospice, Provided psychosocial or spiritual support, ACP counseling assistance, Provided end of life care assistance, Changed to focus  on comfort, Improved pain interventions, Improved non-pain symptom therapy      Patient Active Problem List   Diagnosis Date Noted  . Palliative care by specialist   . Dementia without behavioral disturbance   . C1 cervical fracture ([place]) 04/30/2018  . Protein-calorie malnutrition, severe 04/30/2018  . Fall from ground level 04/29/2018  . Goals of care, counseling/discussion 02/12/2018  . Unsteady gait 02/12/2018  . Weakness 11/19/2016  . Skin lesion of cheek 04/10/2015  . Gas 04/10/2015  . Loss of weight 04/08/2015  . Rib pain on right side 08/16/2014  . Shoulder pain 08/16/2014  . Lower extremity edema 01/20/2014  . Leg pain 01/20/2014  . Rash 09/12/2013  . Urinary frequency 09/12/2013  . Headache 02/19/2013  . Dizziness 02/19/2013  . Inflammatory polyarthropathy ([place]) 08/16/2012  . Osteoarthritis 08/16/2012  . Osteoporosis 08/16/2012  . Anemia 08/16/2012  . GERD (gastroesophageal reflux disease) 08/16/2012  . Hypertension 08/16/2012    Palliative Care Assessment & Plan   Patient Profile: 84 y.o. female  with past medical history of dementia, osteoarthritis, hypertension, GERD, anemia, vitamin D deficiency, and pancreatitis admitted on 04/29/2018 after fall and neck pain from home. Imagining revealed fracture of right C1 facet that extends into foramen transversarium. CTA reveals mild compression of the vertebral artery without occlusion or injury. Cervical collar placed. Patient is not a surgical candidate secondary to age, frailty, and dementia. Palliative medicine consultation for goals of care.   Assessment: Dementia Fall Cervical fracture Hypertension  Recommendations/Plan:  DNR/DNI. Comfort focused care. Medications on board for symptom management.  Although patient is more awake, alert, and taking bites today, family declining PT/OT evaluations. They understand she will likely not do well with aggressive therapy. They wish to focus on her comfort and quality of  life. She is likely aspirating with more congestion today. Scopolamine patch added.   Comfort feeds. Patient requires assist and encouragement.   Patient has 24/7 caregivers in the home setting. Family requesting to return home with hospice services on discharge. RN CM consult placed.  Goals of Care and Additional Recommendations:  Continue supportive care. Focus on comfort.   Code Status:  DNR/DNI  Code Status Orders  (From admission, onward)         Start     Ordered   04/29/18 1816  Do not attempt resuscitation (DNR)  Continuous    Question Answer Comment  In the event of cardiac or respiratory ARREST Do not call a "code blue"   In the event of cardiac or respiratory ARREST Do not perform Intubation, CPR, defibrillation or ACLS   In the event of cardiac or respiratory ARREST Use medication by any route, position, wound care, and other measures to relive pain and suffering. May use oxygen, suction and manual treatment of airway obstruction as needed for comfort.      04/29/18 1815        Code Status History    This patient has a current code status but no historical code status.    Advance Directive Documentation     Most Recent Value  Type of Advance Directive  Living will  Pre-existing out of facility DNR order (yellow form or pink MOST form)  -  "MOST" Form in Place?  -       Prognosis:   Poor prognosis likely weeks secondary to fall, cervical fracture not a surgical candidate, and baseline dementia with declining functional, cognitive, and nutritional status. High risk for aspiration and decline.   Discharge Planning:  Home with Hospice  Care plan was discussed with daughter, RN, Dr. Vianne Bulls  Thank you for allowing the Palliative Medicine Team to assist in the care of this patient.   Time In: 0910 Time Out: 0935 Total Time 11min Prolonged Time Billed  no      Greater than 50%  of this time was spent counseling and coordinating care related to the above  assessment and plan.  Ihor Dow, FNP-C Palliative Medicine Team  Phone: [phone] Fax: [phone]  Please contact Palliative Medicine Team phone at [phone] for questions and concerns.

## 2018-05-02 NOTE — Care Management (Signed)
Spoke with patients daughter, [name] to discuss hospice referral by palliative care. [name] confirmed that she and her siblings do want hospice in the home. Provided her with a list of hopsice agencies available. [facility] with [facility] made.

## 2018-05-02 NOTE — Care Management Important Message (Signed)
Important Message  Patient Details  Name: Heidi [name] MRN: [id number] Date of Birth: 11/14/1917   Medicare Important Message Given:  Yes    Heidi [name] 05/02/2018, 10:55 AM

## 2018-05-02 NOTE — Progress Notes (Signed)
[facility] [place] NAME: Heidi [name]    MR#:  [id number]  DATE OF BIRTH:  18-Jun-1918  Patient family especially the daughter Heidi [name] told me they are interested in taking her home with hospice tomorrow.  CHIEF COMPLAINT:   Chief Complaint  Patient presents with  . Fall  . Neck Pain  Status post fall, neck pain, admitted for C1 fracture.  Son mentioned that the main focuse on comfort. REVIEW OF SYSTEMS:  Review of Systems  Unable to perform ROS: Dementia    DRUG ALLERGIES:   Allergies  Allergen Reactions  . Anhydrous Base Other (See Comments)  . Aspirin   . Carbapenems Other (See Comments)  . Cephalosporins Other (See Comments)  . Levaquin [Levofloxacin In D5w]     Questionable rash   . Penicillins     Has patient had a PCN reaction causing immediate rash, facial/tongue/throat swelling, SOB or lightheadedness with hypotension: No Has patient had a PCN reaction causing severe rash involving mucus membranes or skin necrosis: No Has patient had a PCN reaction that required hospitalization: No Has patient had a PCN reaction occurring within the last 10 years: No If all of the above answers are "NO", then may proceed with Cephalosporin use.   . Sulfa Antibiotics   . Sulfonylureas Other (See Comments)  . Thiazide-Type Diuretics Other (See Comments)  . Tramadol Other (See Comments)    Dizziness   . Macrobid [Nitrofurantoin Monohyd Macro] Rash  . Omnicef [Cefdinir] Rash   VITALS:  Blood pressure (!) 143/61, pulse (!) 102, temperature 98.7 F (37.1 C), temperature source Oral, resp. rate 20, height 5' (1.524 m), weight 59.9 kg, SpO2 92 %. PHYSICAL EXAMINATION:  Physical Exam  Constitutional: She appears lethargic. She appears cachectic. She is sleeping and uncooperative. She appears ill. Cervical collar in place.  HENT:  Head: Normocephalic and atraumatic.  Eyes: Pupils are equal, round, and reactive to light. Conjunctivae  and EOM are normal.  Neck: Normal range of motion. Neck supple. No tracheal deviation present. No thyromegaly present.    Cardiovascular: Normal rate, regular rhythm and normal heart sounds.  Pulmonary/Chest: Effort normal and breath sounds normal. No respiratory distress. She has no wheezes. She exhibits no tenderness.  Abdominal: Soft. Bowel sounds are normal. She exhibits no distension. There is no tenderness.  Musculoskeletal: Normal range of motion.  Neurological: She appears lethargic. She is disoriented. No cranial nerve deficit.  Skin: Skin is warm and dry. No rash noted.   LABORATORY PANEL:  Female CBC Recent Labs  Lab 04/30/18 0410  WBC 10.2  HGB 10.6*  HCT 30.8*  PLT 167   ------------------------------------------------------------------------------------------------------------------ Chemistries  Recent Labs  Lab 04/29/18 0612 04/30/18 0410  NA 141 140  K 3.8 4.0  CL 106 108  CO2 26 26  GLUCOSE 101* 110*  BUN 24* 23  CREATININE 0.85 0.84  CALCIUM 10.0 8.8*  AST 24  --   ALT 15  --   ALKPHOS 46  --   BILITOT 0.6  --    RADIOLOGY:  No results found. ASSESSMENT AND PLAN:  84 year old woman with K/H/O  hypertension, GERD, dementia who suffered a fall.    * Right C1 lateral mass fracture - conservative mgmt per Neurosurgery - cervical collar at all times Unable to eat because of cervical collar, patient condition likely to deteriorate if continues to have poor p.o. intake, secondary to advanced age, cervical fracture he told me that family main focus  is on comfort.,  Use scopolamine patch to help with secretions. Home with hospice tomorrow.  disCussed with patient's daughter Heidi [name]. * HTN/Tachycardia - continue norvasc, metoprolol and cozaar  * Agitation - continue haldol and Remeron  * Dementia Seen by palliative care, CODE STATUS DNR, likely discharge to hospice home if all the family members agree. Discussed with patient's son Heidi [name]. Spoke with  palliative care nurse Heidi [name].  All the records are reviewed and case discussed with Care Management/Social Worker. Management plans discussed with the patient, family (daughter, son) and they are in agreement.  CODE STATUS: DNR  TOTAL TIME TAKING CARE OF THIS PATIENT: 35 minutes.   More than 50% of the time was spent in counseling/coordination of care: YES  POSSIBLE D/C IN 1-2 DAYS, DEPENDING ON CLINICAL CONDITION.   Epifanio Lesches M.D on 05/02/2018 at 1:54 PM  Between 7am to 6pm - Pager - [phone]  After 6pm go to [email or web address] - Proofreader  [facility]  Office  [phone]  CC: Primary care physician; Heidi Pheasant, MD  Note: This dictation was prepared with Dragon dictation along with smaller phrase technology. Any transcriptional errors that result from this process are unintentional.

## 2018-05-02 NOTE — Progress Notes (Signed)
Pharmacy Lovenox Dosing  84 y.o. female admitted with Fall and Neck Pain . Patient ordered Lovenox 40 mg daily for VTE prophylaxis.   Filed Weights   04/29/18 0453 04/29/18 1811  Weight: 101 lb (45.8 kg) 132 lb (59.9 kg)    Body mass index is 25.78 kg/m.  Estimated Creatinine Clearance: 31 mL/min (by C-G formula based on SCr of 0.84 mg/dL).  Per dietician, RN cannot get up to be weighed due to neck fracture. Also, patient is in a bed that is apparently not capable of weighing. Patient's weight thought to be closer to 40 kg and not as reported in Epic.  Will adjust Lovenox dosing to 30 mg daily.  Ulice Dash D 05/02/2018 12:01 PM

## 2018-05-03 DIAGNOSIS — Z515 Encounter for palliative care: Secondary | ICD-10-CM

## 2018-05-03 MED ORDER — MORPHINE SULFATE (CONCENTRATE) 10 MG/0.5ML PO SOLN
10.0000 mg | ORAL | Status: DC | PRN
  Administered 2018-05-03 (×3): 10 mg via ORAL
  Filled 2018-05-03 (×3): qty 1

## 2018-05-03 MED ORDER — GLYCOPYRROLATE 0.2 MG/ML IJ SOLN
0.2000 mg | INTRAMUSCULAR | Status: DC | PRN
  Filled 2018-05-03: qty 1

## 2018-05-03 MED ORDER — SCOPOLAMINE 1 MG/3DAYS TD PT72: 1.0000 | MEDICATED_PATCH | TRANSDERMAL | 12 refills | Status: AC

## 2018-05-03 MED ORDER — MORPHINE SULFATE (CONCENTRATE) 10 MG/0.5ML PO SOLN: 10.0000 mg | ORAL | 0 refills | Status: AC | PRN

## 2018-05-03 MED ORDER — LORAZEPAM 2 MG/ML IJ SOLN: 0.5000 mg | INTRAMUSCULAR | Status: DC | PRN

## 2018-05-03 MED ORDER — MORPHINE SULFATE (PF) 2 MG/ML IV SOLN: 1.0000 mg | INTRAVENOUS | Status: DC | PRN

## 2018-05-03 NOTE — Progress Notes (Signed)
Family meeting with patient's daughter Iline Oven and son Antony Haste, daughter [name] via telephone along with Palliative Medicine NP Jinny Blossom to discuss families concern over patient returning home. Much education and support provided. Plan now is for patient to discharge to the Hospice home this evening. Hospital care team updated and aware. Referral updated. Plan is for transfer to the hospice home this evening. Will continue to follow. Thank you. Flo Shanks RN, BSN, [facility] liaison [phone]

## 2018-05-03 NOTE — Discharge Summary (Signed)
Heidi [name], is a 84 y.o. female  DOB Dec 30, 1917  MRN [id number].  Admission date:  04/29/2018  Admitting Physician  Arta Silence, MD  Discharge Date:  05/03/2018   Primary MD  Einar Pheasant, MD  Recommendations for primary care physician for things to follow:   Follow-up with PCP in 1 week  Admission Diagnosis  Closed displaced lateral mass fracture of first cervical vertebra, initial encounter [facility]) [H70.263Z]   Discharge Diagnosis  Closed displaced lateral mass fracture of first cervical vertebra, initial encounter ([place]) [S12.040A]    Active Problems:   Goals of care, counseling/discussion   Fall from ground level   C1 cervical fracture ([place])   Protein-calorie malnutrition, severe   Palliative care by specialist   Dementia without behavioral disturbance   At high risk for aspiration   Increased oropharyngeal secretions      Past Medical History:  Diagnosis Date  . Anemia   . Asthma   . GERD (gastroesophageal reflux disease)   . Hypertension   . Inflammatory arthritis    elevated ESR, negative temporal artery bx, low titer rheumatoid factor  . Nephrolithiasis   . OA (osteoarthritis)    Left knee replacement, hands, shoulders, cervial spine  . Osteoporosis    vitamin D deficiency, nasal miacalcin, reclast, previous rib fracture  . Pancreatitis    s/p cholecystectomy with ERCP and stone extraction  . Vitamin D deficiency     Past Surgical History:  Procedure Laterality Date  . ABDOMINAL HYSTERECTOMY     secondary  to bleeding  . LITHOTRIPSY    . REPLACEMENT TOTAL KNEE     Left knee replacement       History of present illness and  Hospital Course:     Kindly see H&P for history of present illness and admission details, please review complete Labs, Consult reports and Test reports  for all details in brief  HPI  from the history and physical done on the day of admission 84 year old female with history of dementia, osteoarthritis, hypertension, GERD admitted on August 19 for a fall, neck pain.  Imaging revealed right C1 vertebral fracture.  Cervical collar placed, admitted to hospitalist service.   Hospital Course  Right C1 lateral mass fracture, conservative management as per neurosurgery, patient has cervical collar all the time. \Because of her advanced age, with acute cervical fracture patient is not a candidate for any rehab/surgery.  Palliative care team met with family, family decided to take the patient home with hospice.  Hospice liaison Santiago Glad is involved.  At this time main focus is on comfort so I discontinued all her blood pressure medication, patient is having really hard time swallowing medicines and also in a food.  Initially patient received IV fluids for dehydration.  Patient will be going home today with hospice to follow.  Scopolamine patch started yesterday to help with her secretions, gave prescription for Roxanol for pain, anxiety, mild hunger. CODE STATUS DNR ,prognosis is extremely poor.  Discharge Condition: Stable   Follow UP      Discharge Instructions  and  Discharge Medications       Allergies as of 05/03/2018      Reactions   Anhydrous Base Other (See Comments)   Aspirin    Carbapenems Other (See Comments)   Cephalosporins Other (See Comments)   Levaquin [levofloxacin In D5w]    Questionable rash   Penicillins    Has patient had a PCN reaction causing immediate rash, facial/tongue/throat swelling, SOB or  lightheadedness with hypotension: No Has patient had a PCN reaction causing severe rash involving mucus membranes or skin necrosis: No Has patient had a PCN reaction that required hospitalization: No Has patient had a PCN reaction occurring within the last 10 years: No If all of the above answers are "NO", then may proceed with  Cephalosporin use.   Sulfa Antibiotics    Sulfonylureas Other (See Comments)   Thiazide-type Diuretics Other (See Comments)   Tramadol Other (See Comments)   Dizziness   Macrobid [nitrofurantoin Monohyd Macro] Rash   Omnicef [cefdinir] Rash      Medication List    STOP taking these medications   acetaminophen 650 MG CR tablet Commonly known as:  TYLENOL   amLODipine 5 MG tablet Commonly known as:  NORVASC   aspirin EC 81 MG tablet   B-complex with vitamin C tablet   CALCIUM 600+D 600-200 MG-UNIT Tabs Generic drug:  Calcium Carbonate-Vitamin D   losartan 50 MG tablet Commonly known as:  COZAAR   meloxicam 15 MG tablet Commonly known as:  MOBIC   metoprolol succinate 25 MG 24 hr tablet Commonly known as:  TOPROL-XL   mirtazapine 7.5 MG tablet Commonly known as:  REMERON   montelukast 10 MG tablet Commonly known as:  SINGULAIR   vitamin C 500 MG tablet Commonly known as:  ASCORBIC ACID   Vitamin D 2000 units tablet   vitamin E 400 UNIT capsule     TAKE these medications   morphine CONCENTRATE 10 MG/0.5ML Soln concentrated solution Take 0.5 mLs (10 mg total) by mouth every 2 (two) hours as needed for moderate pain, severe pain, anxiety or shortness of breath.   scopolamine 1 MG/3DAYS Commonly known as:  TRANSDERM-SCOP Place 1 patch (1.5 mg total) onto the skin every 3 (three) days. Start taking on:  05/05/2018         Diet and Activity recommendation: See Discharge Instructions above   Consults obtained - palliative care   Major procedures and Radiology Reports - PLEASE review detailed and final reports for all details, in brief -      Dg Elbow Complete Right  Result Date: 04/29/2018 CLINICAL DATA:  Skin tear after a fall. EXAM: RIGHT ELBOW - COMPLETE 3+ VIEW COMPARISON:  None. FINDINGS: There is no evidence of fracture, dislocation, or joint effusion. There is no evidence of arthropathy or other focal bone abnormality. Soft tissues are  unremarkable. IMPRESSION: No evidence of acute fracture or dislocation. Electronically Signed   By: Lucienne Capers M.D.   On: 04/29/2018 05:37   Ct Head Wo Contrast  Result Date: 04/29/2018 CLINICAL DATA:  Initial evaluation for acute trauma, fall. EXAM: CT HEAD WITHOUT CONTRAST CT CERVICAL SPINE WITHOUT CONTRAST TECHNIQUE: Multidetector CT imaging of the head and cervical spine was performed following the standard protocol without intravenous contrast. Multiplanar CT image reconstructions of the cervical spine were also generated. COMPARISON:  Prior CT from 01/17/2018 FINDINGS: CT HEAD FINDINGS Brain: Atrophy with chronic small vessel ischemic disease. No acute intracranial hemorrhage. No acute large vessel territory infarct. No mass lesion, midline shift or mass effect. No hydrocephalus. No extra-axial fluid collection. Vascular: No hyperdense vessel. Scattered vascular calcifications noted within the carotid siphons. Skull: Large hematoma present at the scalp vertex. Calvarium intact. Sinuses/Orbits: Globes and orbital soft tissues demonstrate no acute abnormality. Chronic mucoperiosteal thickening present within the ethmoidal air cells, maxillary sinuses, and left sphenoid sinus. Mastoid air cells are clear. Other: None. CT CERVICAL SPINE FINDINGS Alignment: Trace anterolisthesis of  C2 on C3, with trace retrolisthesis of C3 on C4, C4 on C5, and C5 on C6. Trace anterolisthesis of C7 on T1. Findings are likely chronic and facet mediated. Skull base and vertebrae: Visualized skull base intact. Acute minimally displaced fracture of the right occipital condyle (series 9, image 33). There is an acute comminuted fracture extending through the right lateral mass of C1 (series 4, image 19). Partial extension into the right aspect of the anterior arch (series 3, image 11). Posterior arch grossly intact. Involvement of the right C1 transverse foramen. Normal atlantodental interval maintained. Dens intact. Vertebral  body heights maintained. No other acute fracture. Soft tissues and spinal canal: Soft tissues of the neck demonstrate no acute finding. No significant prevertebral edema. Vascular calcifications present about the carotid bifurcations. Possible small amount of epidural hematoma within the upper cervical spine. Disc levels: Prominent degenerative thickening about the tectorial membrane. Moderate multilevel cervical spondylolysis at C3-4 through C6-7. Multilevel facet arthrosis, worse on the right, most notable at C2-3 and C7-T1. Upper chest: Visualized upper chest demonstrates no acute finding. Partially visualized lungs are clear. Other: None. IMPRESSION: CT BRAIN: 1. No acute intracranial process identified. 2. Large soft tissue hematoma at the scalp vertex. No calvarial fracture. 3. Atrophy with chronic small vessel ischemic disease. CT CERVICAL SPINE: 1. Acute comminuted fracture involving the right lateral mass of C1 with involvement of the right transverse foramen. Follow-up examination with CTA to evaluate for possible vascular injury suggested. 2. Acute minimally displaced right occipital condyle fracture. 3. No other acute traumatic injury within the cervical spine. 4. Moderate cervical spondylolysis at C3-4 through C6-7. Critical Value/emergent results were called by telephone at the time of interpretation on 04/29/2018 at 5:48 am to Dr. Darel Hong , who verbally acknowledged these results. Electronically Signed   By: Jeannine Boga M.D.   On: 04/29/2018 05:50   Ct Angio Neck W And/or Wo Contrast  Result Date: 04/29/2018 CLINICAL DATA:  Fracture right occipital condyle and C1. Rule out arterial injury EXAM: CT ANGIOGRAPHY NECK TECHNIQUE: Multidetector CT imaging of the neck was performed using the standard protocol during bolus administration of intravenous contrast. Multiplanar CT image reconstructions and MIPs were obtained to evaluate the vascular anatomy. Carotid stenosis measurements (when  applicable) are obtained utilizing NASCET criteria, using the distal internal carotid diameter as the denominator. CONTRAST:  66m ISOVUE-370 IOPAMIDOL (ISOVUE-370) INJECTION 76% COMPARISON:  CT cervical spine 04/29/2018 FINDINGS: Aortic arch: Mild atherosclerotic disease in the aortic arch. Proximal great vessels are tortuous with mild atherosclerotic calcification but no significant stenosis. Right carotid system: Minimal atherosclerotic disease right carotid bifurcation without stenosis or vascular injury. Left carotid system: Mild atherosclerotic calcification left carotid bifurcation without stenosis or vascular injury. Vertebral arteries: Right vertebral artery dominant and tortuous. Comminuted fracture lateral mass of C1 on the right. Fracture extends through the vertebral foramen with flattening of the vertebral artery as it passes through the narrowed foramen. No occlusion or thrombus. No definite intimal irregularity. Luminal diameter approximately 50% diameter stenosis. Left vertebral artery is widely patent without significant stenosis or injury. Skeleton: Fracture of the occipital condyle on the right. Comminuted fracture right C1 lateral mass extending into the vertebral foramen. These are reported on the earlier cervical spine CT. Multilevel spondylosis throughout the cervical spine. Mild anterolisthesis C2-3 with extensive facet degeneration on the right at C2-3. Other neck: Negative for mass or adenopathy Upper chest: Negative IMPRESSION: As noted on earlier cervical spine CT, there is a comminuted fracture of the right  lateral mass of C1 extending into the vertebral foramen. 50% diameter stenosis of the distal right vertebral artery at the C1 level due to bony narrowing of the vertebral foramen. No evidence of vascular irregularity or thrombus. Left vertebral artery normal. Mild atherosclerotic disease in the carotid bifurcation bilaterally. No significant carotid stenosis. Advanced cervical  spondylosis. Electronically Signed   By: Franchot Gallo M.D.   On: 04/29/2018 08:03   Ct Cervical Spine Wo Contrast  Result Date: 04/29/2018 CLINICAL DATA:  Initial evaluation for acute trauma, fall. EXAM: CT HEAD WITHOUT CONTRAST CT CERVICAL SPINE WITHOUT CONTRAST TECHNIQUE: Multidetector CT imaging of the head and cervical spine was performed following the standard protocol without intravenous contrast. Multiplanar CT image reconstructions of the cervical spine were also generated. COMPARISON:  Prior CT from 01/17/2018 FINDINGS: CT HEAD FINDINGS Brain: Atrophy with chronic small vessel ischemic disease. No acute intracranial hemorrhage. No acute large vessel territory infarct. No mass lesion, midline shift or mass effect. No hydrocephalus. No extra-axial fluid collection. Vascular: No hyperdense vessel. Scattered vascular calcifications noted within the carotid siphons. Skull: Large hematoma present at the scalp vertex. Calvarium intact. Sinuses/Orbits: Globes and orbital soft tissues demonstrate no acute abnormality. Chronic mucoperiosteal thickening present within the ethmoidal air cells, maxillary sinuses, and left sphenoid sinus. Mastoid air cells are clear. Other: None. CT CERVICAL SPINE FINDINGS Alignment: Trace anterolisthesis of C2 on C3, with trace retrolisthesis of C3 on C4, C4 on C5, and C5 on C6. Trace anterolisthesis of C7 on T1. Findings are likely chronic and facet mediated. Skull base and vertebrae: Visualized skull base intact. Acute minimally displaced fracture of the right occipital condyle (series 9, image 33). There is an acute comminuted fracture extending through the right lateral mass of C1 (series 4, image 19). Partial extension into the right aspect of the anterior arch (series 3, image 11). Posterior arch grossly intact. Involvement of the right C1 transverse foramen. Normal atlantodental interval maintained. Dens intact. Vertebral body heights maintained. No other acute fracture.  Soft tissues and spinal canal: Soft tissues of the neck demonstrate no acute finding. No significant prevertebral edema. Vascular calcifications present about the carotid bifurcations. Possible small amount of epidural hematoma within the upper cervical spine. Disc levels: Prominent degenerative thickening about the tectorial membrane. Moderate multilevel cervical spondylolysis at C3-4 through C6-7. Multilevel facet arthrosis, worse on the right, most notable at C2-3 and C7-T1. Upper chest: Visualized upper chest demonstrates no acute finding. Partially visualized lungs are clear. Other: None. IMPRESSION: CT BRAIN: 1. No acute intracranial process identified. 2. Large soft tissue hematoma at the scalp vertex. No calvarial fracture. 3. Atrophy with chronic small vessel ischemic disease. CT CERVICAL SPINE: 1. Acute comminuted fracture involving the right lateral mass of C1 with involvement of the right transverse foramen. Follow-up examination with CTA to evaluate for possible vascular injury suggested. 2. Acute minimally displaced right occipital condyle fracture. 3. No other acute traumatic injury within the cervical spine. 4. Moderate cervical spondylolysis at C3-4 through C6-7. Critical Value/emergent results were called by telephone at the time of interpretation on 04/29/2018 at 5:48 am to Dr. Darel Hong , who verbally acknowledged these results. Electronically Signed   By: Jeannine Boga M.D.   On: 04/29/2018 05:50   Dg Chest Port 1 View  Result Date: 04/29/2018 CLINICAL DATA:  Neck pain after a fall. EXAM: PORTABLE CHEST 1 VIEW COMPARISON:  01/26/2018 FINDINGS: Cardiac enlargement. No pulmonary vascular congestion. Scattered calcified granulomas in the lungs. No edema or consolidation. No blunting  of costophrenic angles. No pneumothorax. Calcified and tortuous aorta. Old appearing bilateral rib fractures. Degenerative changes in the spine and shoulders. Previous resection or resorption of the  distal left clavicle. 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 IMPRESSION: Cardiac enlargement. No evidence of active pulmonary disease. Electronically Signed   By: Lucienne Capers M.D.   On: 04/29/2018 05:36    Micro Results     No results found for this or any previous visit (from the past 240 hour(s)).     Today   Subjective:   Heidi [name] is stable for discharge home with hospice.  Objective:   Blood pressure (!) 138/50, pulse 98, temperature 99.4 F (37.4 C), temperature source Axillary, resp. rate (!) 24, height 5' (1.524 m), weight 59.9 kg, SpO2 90 %.   Intake/Output Summary (Last 24 hours) at 05/03/2018 0937 Last data filed at 05/03/2018 0650 Gross per 24 hour  Intake 0 ml  Output 450 ml  Net -450 ml    Exam  resting comfortably at this time.  [email or web address],PERRAL Patient has cervical collar in place.  Symmetrical Chest wall movement, Good air movement bilaterally, CTAB RRR,No Gallops,Rubs or new Murmurs, No Parasternal Heave +ve B.Sounds, Abd Soft, Non tender, No organomegaly appriciated, No rebound -guarding or rigidity. No Cyanosis, Clubbing or edema, No new Rash or bruise  Data Review   CBC w Diff:  Lab Results  Component Value Date   WBC 10.2 04/30/2018   HGB 10.6 (L) 04/30/2018   HGB 11.2 (L) 08/09/2014   HCT 30.8 (L) 04/30/2018   HCT 34.3 (L) 08/09/2014   PLT 167 04/30/2018   PLT 199 08/09/2014   LYMPHOPCT 10 04/29/2018   LYMPHOPCT 16.2 08/09/2014   MONOPCT 6 04/29/2018   MONOPCT 10.2 08/09/2014   EOSPCT 0 04/29/2018   EOSPCT 0.3 08/09/2014   BASOPCT 0 04/29/2018   BASOPCT 0.3 08/09/2014    CMP:  Lab Results  Component Value Date   NA 140 04/30/2018   NA 136 08/09/2014   K 4.0 04/30/2018   K 3.8 08/09/2014   CL 108 04/30/2018   CL 100 08/09/2014   CO2 26 04/30/2018   CO2 30 08/09/2014   BUN 23 04/30/2018   BUN 21 (H) 08/09/2014   CREATININE 0.84 04/30/2018   CREATININE 0.90 08/09/2014    PROT 7.2 04/29/2018   PROT 6.6 08/30/2013   ALBUMIN 4.5 04/29/2018   ALBUMIN 3.7 08/30/2013   BILITOT 0.6 04/29/2018   BILITOT 0.3 08/30/2013   ALKPHOS 46 04/29/2018   ALKPHOS 37 (L) 08/30/2013   AST 24 04/29/2018   AST 14 (L) 08/30/2013   ALT 15 04/29/2018   ALT 21 08/30/2013  .   Total Time in preparing paper work, data evaluation and todays exam - 68 minutes  Epifanio Lesches M.D on 05/03/2018 at 9:37 AM    Note: This dictation was prepared with Dragon dictation along with smaller phrase technology. Any transcriptional errors that result from this process are unintentional.

## 2018-05-03 NOTE — Progress Notes (Signed)
[facility] [place] NAME: Heidi [name]    MR#:  [id number]  DATE OF BIRTH:  20-Aug-1918  Patient family especially the daughter Heidi [name] told me they are interested in taking her home with hospice tomorrow.  CHIEF COMPLAINT:   Chief Complaint  Patient presents with  . Fall  . Neck Pain  Status post fall, neck pain, admitted for C1 fracture.  Son mentioned that the main focuse on comfort. REVIEW OF SYSTEMS:  Review of Systems  Unable to perform ROS: Dementia    DRUG ALLERGIES:   Allergies  Allergen Reactions  . Anhydrous Base Other (See Comments)  . Aspirin   . Carbapenems Other (See Comments)  . Cephalosporins Other (See Comments)  . Levaquin [Levofloxacin In D5w]     Questionable rash   . Penicillins     Has patient had a PCN reaction causing immediate rash, facial/tongue/throat swelling, SOB or lightheadedness with hypotension: No Has patient had a PCN reaction causing severe rash involving mucus membranes or skin necrosis: No Has patient had a PCN reaction that required hospitalization: No Has patient had a PCN reaction occurring within the last 10 years: No If all of the above answers are "NO", then may proceed with Cephalosporin use.   . Sulfa Antibiotics   . Sulfonylureas Other (See Comments)  . Thiazide-Type Diuretics Other (See Comments)  . Tramadol Other (See Comments)    Dizziness   . Macrobid [Nitrofurantoin Monohyd Macro] Rash  . Omnicef [Cefdinir] Rash   VITALS:  Blood pressure (!) 138/50, pulse 98, temperature 99.4 F (37.4 C), temperature source Axillary, resp. rate (!) 24, height 5' (1.524 m), weight 59.9 kg, SpO2 90 %. PHYSICAL EXAMINATION:  Physical Exam  Constitutional: She appears lethargic. She appears cachectic. She is sleeping and uncooperative. She appears ill. Cervical collar in place.  HENT:  Head: Normocephalic and atraumatic.  Eyes: Pupils are equal, round, and reactive to light.  Conjunctivae and EOM are normal.  Neck: Normal range of motion. Neck supple. No tracheal deviation present. No thyromegaly present.    Cardiovascular: Normal rate, regular rhythm and normal heart sounds.  Pulmonary/Chest: Effort normal and breath sounds normal. No respiratory distress. She has no wheezes. She exhibits no tenderness.  Abdominal: Soft. Bowel sounds are normal. She exhibits no distension. There is no tenderness.  Musculoskeletal: Normal range of motion.  Neurological: She appears lethargic. She is disoriented. No cranial nerve deficit.  Skin: Skin is warm and dry. No rash noted.   LABORATORY PANEL:  Female CBC Recent Labs  Lab 04/30/18 0410  WBC 10.2  HGB 10.6*  HCT 30.8*  PLT 167   ------------------------------------------------------------------------------------------------------------------ Chemistries  Recent Labs  Lab 04/29/18 0612 04/30/18 0410  NA 141 140  K 3.8 4.0  CL 106 108  CO2 26 26  GLUCOSE 101* 110*  BUN 24* 23  CREATININE 0.85 0.84  CALCIUM 10.0 8.8*  AST 24  --   ALT 15  --   ALKPHOS 46  --   BILITOT 0.6  --    RADIOLOGY:  No results found. ASSESSMENT AND PLAN:  84 year old woman with K/H/O  hypertension, GERD, dementia who suffered a fall.    * Right C1 lateral mass fracture - conservative mgmt per Neurosurgery - cervical collar at all times Unable to eat because of cervical collar, patient condition likely to deteriorate if continues to have poor p.o. intake, secondary to advanced age, cervical fracture he told me that family main focus  is on comfort.,  Use scopolamine patch to help with secretions. Home with hospice tomorrow.  disCussed with patient's daughter Heidi [name]. * HTN/Tachycardia - continue norvasc, metoprolol and cozaar  * Agitation - continue haldol and Remeron  * Dementia Seen by palliative care, CODE STATUS DNR, likely discharge to hospice home if all the family members agree. Discussed with patient's son  Heidi [name]. Spoke with palliative care nurse Megan.  All the records are reviewed and case discussed with Care Management/Social Worker. Management plans discussed with the patient, family (daughter, son) and they are in agreement.  CODE STATUS: DNR  TOTAL TIME TAKING CARE OF THIS PATIENT: 35 minutes.   More than 50% of the time was spent in counseling/coordination of care: YES  POSSIBLE D/C IN 1-2 DAYS, DEPENDING ON CLINICAL CONDITION.   Epifanio Lesches M.D on 05/03/2018 at 9:53 AM  Between 7am to 6pm - Pager - [phone]  After 6pm go to [email or web address] - Proofreader  [facility]  Office  [phone]  CC: Primary care physician; Einar Pheasant, MD  Note: This dictation was prepared with Dragon dictation along with smaller phrase technology. Any transcriptional errors that result from this process are unintentional.

## 2018-05-03 NOTE — Progress Notes (Signed)
Writer met again with patient's daughter [name], consents signed. Plan is for discharge to the hospice home this evening. Patient seen lying ion bed, asleep, liquid morphine given at 12 pm for pain management. Scopolamine patch in place, patient is not eating or drinking today.  Report called to the hospice home. Writer to arrange EMS transport for 8 pm pick up. Hospital care team updated. Thank you. Flo Shanks RN, BSN, [facility] [place], hospital Liaison [phone]

## 2018-05-03 NOTE — Progress Notes (Addendum)
[facility] [place] NAME: Heidi [name]    MR#:  [id number]  DATE OF BIRTH:  Jul 07, 1918  Son is requesting that if she can stay one more night so they can get home care services lined up today evening and she can go home tomorrow with hospice.  CHIEF COMPLAINT:   Chief Complaint  Patient presents with  . Fall  . Neck Pain  Status post fall, neck pain, admitted for C1 fracture.  Son mentioned that the main focuse on comfort. Low-grade temperature  REVIEW OF SYSTEMS:  Review of Systems  Unable to perform ROS: Dementia    DRUG ALLERGIES:   Allergies  Allergen Reactions  . Anhydrous Base Other (See Comments)  . Aspirin   . Carbapenems Other (See Comments)  . Cephalosporins Other (See Comments)  . Levaquin [Levofloxacin In D5w]     Questionable rash   . Penicillins     Has patient had a PCN reaction causing immediate rash, facial/tongue/throat swelling, SOB or lightheadedness with hypotension: No Has patient had a PCN reaction causing severe rash involving mucus membranes or skin necrosis: No Has patient had a PCN reaction that required hospitalization: No Has patient had a PCN reaction occurring within the last 10 years: No If all of the above answers are "NO", then may proceed with Cephalosporin use.   . Sulfa Antibiotics   . Sulfonylureas Other (See Comments)  . Thiazide-Type Diuretics Other (See Comments)  . Tramadol Other (See Comments)    Dizziness   . Macrobid [Nitrofurantoin Monohyd Macro] Rash  . Omnicef [Cefdinir] Rash   VITALS:  Blood pressure (!) 138/50, pulse 98, temperature 99.4 F (37.4 C), temperature source Axillary, resp. rate (!) 24, height 5' (1.524 m), weight 59.9 kg, SpO2 90 %. PHYSICAL EXAMINATION:  Physical Exam  Constitutional: She appears lethargic. She appears cachectic. She is sleeping and uncooperative. She appears ill. Cervical collar in place.  HENT:  Head: Normocephalic and atraumatic.    Eyes: Pupils are equal, round, and reactive to light. Conjunctivae and EOM are normal.  Neck: Normal range of motion. Neck supple. No tracheal deviation present. No thyromegaly present.    Cardiovascular: Normal rate, regular rhythm and normal heart sounds.  Pulmonary/Chest: Effort normal and breath sounds normal. No respiratory distress. She has no wheezes. She exhibits no tenderness.  Abdominal: Soft. Bowel sounds are normal. She exhibits no distension. There is no tenderness.  Musculoskeletal: Normal range of motion.  Neurological: She appears lethargic. She is disoriented. No cranial nerve deficit.  Skin: Skin is warm and dry. No rash noted.   LABORATORY PANEL:  Female CBC Recent Labs  Lab 04/30/18 0410  WBC 10.2  HGB 10.6*  HCT 30.8*  PLT 167   ------------------------------------------------------------------------------------------------------------------ Chemistries  Recent Labs  Lab 04/29/18 0612 04/30/18 0410  NA 141 140  K 3.8 4.0  CL 106 108  CO2 26 26  GLUCOSE 101* 110*  BUN 24* 23  CREATININE 0.85 0.84  CALCIUM 10.0 8.8*  AST 24  --   ALT 15  --   ALKPHOS 46  --   BILITOT 0.6  --    RADIOLOGY:  No results found. ASSESSMENT AND PLAN:  84 year old woman with K/H/O  hypertension, GERD, dementia who suffered a fall.    * Right C1 lateral mass fracture - conservative mgmt per Neurosurgery - cervical collar at all times Unable to eat because of cervical collar, patient condition likely to deteriorate if continues to have  poor p.o. intake, secondary to advanced age, cervical fracture he told me that family main focus is on comfort.,  Use scopolamine patch to help with secretions.  Unable to discharge home today because increased services not set up,at home,son is requesting to discharge her tomorrow, likely discharge home with hospice tomorrow. * Dementia Seen by palliative care, CODE STATUS DNR, likely discharge to hospice home if all the family members  agree. Discussed with patient's son Heidi [name]. Spoke with palliative care nurse Megan.  All the records are reviewed and case discussed with Care Management/Social Worker. Management plans discussed with the patient, family (daughter, son) and they are in agreement.  CODE STATUS: DNR  TOTAL TIME TAKING CARE OF THIS PATIENT: 35 minutes.   POSSIBLE D/C IN 1-2 DAYS, DEPENDING ON CLINICAL CONDITION.   Epifanio Lesches M.D on 05/03/2018 at 9:53 AM  Between 7am to 6pm - Pager - [phone]  After 6pm go to [email or web address] - Proofreader  [facility]  Office  [phone]  CC: Primary care physician; Einar Pheasant, MD  Note: This dictation was prepared with Dragon dictation along with smaller phrase technology. Any transcriptional errors that result from this process are unintentional.

## 2018-05-03 NOTE — Progress Notes (Signed)
Daily Progress Note   Patient Name: Heidi [name]       Date: 05/03/2018 DOB: 25-Jan-1918  Age: 84 y.o. MRN#: [id number] Attending Physician: Epifanio Lesches, MD Primary Care Physician: Einar Pheasant, MD Admit Date: 04/29/2018  Reason for Consultation/Follow-up: Establishing goals of care and Terminal Care  Subjective/GOC: Patient drowsy. Will wake to voice but disoriented and not following commands. Appears comfortable resting in bed.   F/u with son Zenia Resides), daughter (Lib), and also daughter [name] on speaker phone regarding plan of care. Again discussed course of hospitalization including diagnoses, interventions and concern with poor prognosis secondary to dementia, fall, aspiration, poor nutritional status, and declining functional status. Originally, family was planning to take patient home with hospice services. Discussed home with hospice versus hospice facility. Educated on hospice philosophy and role of comfort, quality, and dignity at EOL. Educated on EOL expectations and medications to ensure comfort and relief from suffering.   Family tearful but have decided to pursue hospice facility placement.   Therapeutic listening and emotional support provided. Answered all questions and concerns.   Length of Stay: 4  Current Medications: Scheduled Meds:  . amLODipine  5 mg Oral Daily  . enoxaparin (LOVENOX) injection  30 mg Subcutaneous Q24H  . feeding supplement (ENSURE ENLIVE)  237 mL Oral BID BM  . losartan  50 mg Oral Daily  . mouth rinse  15 mL Mouth Rinse BID  . metoprolol succinate  12.5 mg Oral Daily  . mirtazapine  7.5 mg Oral QHS  . montelukast  10 mg Oral Daily  . scopolamine  1 patch Transdermal Q72H  . sodium chloride flush  3 mL Intravenous Q12H     Continuous Infusions:   PRN Meds: acetaminophen **OR** acetaminophen, bisacodyl, morphine injection, morphine CONCENTRATE, ondansetron **OR** ondansetron (ZOFRAN) IV, senna-docusate  Physical Exam  Constitutional: She is easily aroused. She appears ill.  HENT:  Head: Normocephalic and atraumatic.  Cardiovascular: Regular rhythm.  Pulmonary/Chest: No accessory muscle usage. No tachypnea. No respiratory distress.  Musculoskeletal:  Cervical collar  Neurological: She is easily aroused.  Drowsy, disoriented  Skin: Skin is warm and dry. There is pallor.  Psychiatric: Her speech is delayed. Cognition and memory are impaired. She is inattentive.  Nursing note and vitals reviewed.          Vital Signs: BP Marland Kitchen)  138/50 (BP Location: Left Leg)   Pulse 98   Temp 99.4 F (37.4 C) (Axillary)   Resp (!) 24   Ht 5' (1.524 m)   Wt 59.9 kg   SpO2 90%   BMI 25.78 kg/m  SpO2: SpO2: 90 % O2 Device: O2 Device: Room Air O2 Flow Rate:    Intake/output summary:   Intake/Output Summary (Last 24 hours) at 05/03/2018 1053 Last data filed at 05/03/2018 7124 Gross per 24 hour  Intake 0 ml  Output 450 ml  Net -450 ml   LBM: Last BM Date: 05/02/18 Baseline Weight: Weight: 45.8 kg Most recent weight: Weight: 59.9 kg       Palliative Assessment/Data: PPS 20%   Flowsheet Rows     Most Recent Value  Intake Tab  Referral Department  Hospitalist  Unit at Time of Referral  Med/Surg Unit  Palliative Care Primary Diagnosis  Neurology  Palliative Care Type  New Palliative care  Reason for referral  Clarify Goals of Care  Date first seen by Palliative Care  04/30/18  Clinical Assessment  Palliative Performance Scale Score  20%  Psychosocial & Spiritual Assessment  Palliative Care Outcomes  Patient/Family meeting held?  Yes  Who was at the meeting?  daughter, two sons  Palliative Care Outcomes  Clarified goals of care, Counseled regarding hospice, Provided psychosocial or spiritual support,  ACP counseling assistance, Provided end of life care assistance, Changed to focus on comfort, Improved pain interventions, Improved non-pain symptom therapy      Patient Active Problem List   Diagnosis Date Noted  . At high risk for aspiration   . Increased oropharyngeal secretions   . Palliative care by specialist   . Dementia without behavioral disturbance   . C1 cervical fracture ([place]) 04/30/2018  . Protein-calorie malnutrition, severe 04/30/2018  . Fall from ground level 04/29/2018  . Goals of care, counseling/discussion 02/12/2018  . Unsteady gait 02/12/2018  . Weakness 11/19/2016  . Skin lesion of cheek 04/10/2015  . Gas 04/10/2015  . Loss of weight 04/08/2015  . Rib pain on right side 08/16/2014  . Shoulder pain 08/16/2014  . Lower extremity edema 01/20/2014  . Leg pain 01/20/2014  . Rash 09/12/2013  . Urinary frequency 09/12/2013  . Headache 02/19/2013  . Dizziness 02/19/2013  . Inflammatory polyarthropathy ([place]) 08/16/2012  . Osteoarthritis 08/16/2012  . Osteoporosis 08/16/2012  . Anemia 08/16/2012  . GERD (gastroesophageal reflux disease) 08/16/2012  . Hypertension 08/16/2012    Palliative Care Assessment & Plan   Patient Profile: 84 y.o. female  with past medical history of dementia, osteoarthritis, hypertension, GERD, anemia, vitamin D deficiency, and pancreatitis admitted on 04/29/2018 after fall and neck pain from home. Imagining revealed fracture of right C1 facet that extends into foramen transversarium. CTA reveals mild compression of the vertebral artery without occlusion or injury. Cervical collar placed. Patient is not a surgical candidate secondary to age, frailty, and dementia. Palliative medicine consultation for goals of care.   Assessment: Dementia Fall Cervical fracture Hypertension  Recommendations/Plan:  DNR/DNI. Comfort measures only.   Symptom management  Morphine 1-2mg  IV q2h prn pain/dyspnea/air hunger  Robinul 0.2mg  IV q4h prn  secretions  Ativan 0.5mg  IV q4h prn anxiety  Scopolamine patch  F/u with family regarding hospice. They have decided to pursue hospice facility placement, understanding poor prognosis of <2 weeks if not days. SW notified. Hospice liaison present during meeting with family.   Goals of Care and Additional Recommendations:  Continue supportive care. Focus on comfort.  Code Status: DNR/DNI  Code Status Orders  (From admission, onward)         Start     Ordered   04/29/18 1816  Do not attempt resuscitation (DNR)  Continuous    Question Answer Comment  In the event of cardiac or respiratory ARREST Do not call a "code blue"   In the event of cardiac or respiratory ARREST Do not perform Intubation, CPR, defibrillation or ACLS   In the event of cardiac or respiratory ARREST Use medication by any route, position, wound care, and other measures to relive pain and suffering. May use oxygen, suction and manual treatment of airway obstruction as needed for comfort.      04/29/18 1815        Code Status History    This patient has a current code status but no historical code status.    Advance Directive Documentation     Most Recent Value  Type of Advance Directive  Living will  Pre-existing out of facility DNR order (yellow form or pink MOST form)  -  "MOST" Form in Place?  -       Prognosis:  <2 weeks if not days secondary to fall, cervical fracture not a surgical candidate, and baseline dementia with declining functional, cognitive, and nutritional status. High risk for aspiration and decline.   Discharge Planning:  Hospice facility  Care plan was discussed with daughters, son, RN, hospice liaison, Dr. Vianne Bulls  Thank you for allowing the Palliative Medicine Team to assist in the care of this patient.   Time In: 1020- 1130- Time Out: [id number] Total Time 28min Prolonged Time Billed  no      Greater than 50%  of this time was spent counseling and coordinating care  related to the above assessment and plan.  Ihor Dow, FNP-C Palliative Medicine Team  Phone: [phone] Fax: [phone]  Please contact Palliative Medicine Team phone at [phone] for questions and concerns.

## 2018-05-03 NOTE — Progress Notes (Signed)
Patient discharge via EMS at 2041 to hospice, patients belonging sent with daughter.

## 2018-05-03 NOTE — Clinical Social Work Note (Signed)
Patient was supposed to discharge home with hospice services through [facility]. Today family has changed the disposition and is now requesting Hospice home at [facility] home. Santiago Glad, hospice liaison is aware and working on change in disposition. CSW will follow to assist with discharge.   [place], [place]

## 2018-05-03 NOTE — Telephone Encounter (Signed)
Would this be something for the family to decide or will you still be her pcp?

## 2018-05-05 NOTE — Telephone Encounter (Signed)
Family can decide if they want the hospice physician to follow or me to follow.

## 2018-05-06 NOTE — Telephone Encounter (Signed)
Patient is in hospice home since Friday. Dr. Nicki Reaper is listed at consulting physician not attending.

## 2018-05-12 DEATH — deceased

## 2018-09-17 ENCOUNTER — Ambulatory Visit: Payer: Medicare Other

## 2018-09-17 ENCOUNTER — Ambulatory Visit: Payer: Medicare Other | Admitting: Internal Medicine

## 2019-01-01 IMAGING — CT CT HEAD W/O CM
3 series · 15 of 46 positions shown, 18 images · non-contrast
Comparison: 01/30/2017 head CT.

CLINICAL DATA: Fall with left facial injury.

EXAM:
CT HEAD WITHOUT CONTRAST
TECHNIQUE: Contiguous axial images were obtained from the base of the skull
through the vertex without intravenous contrast.

[Series 2: head wo · axial · 0.47mm/px · z∈[-193,-73]mm · 9 of 29 slices shown, 12 images]
[im 3/29  brain]
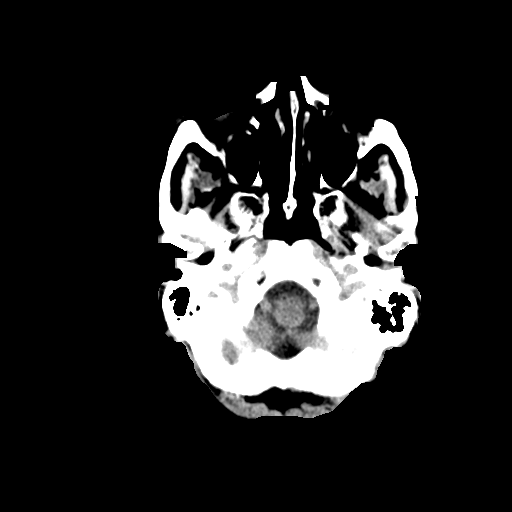
[im 3/29  bone]
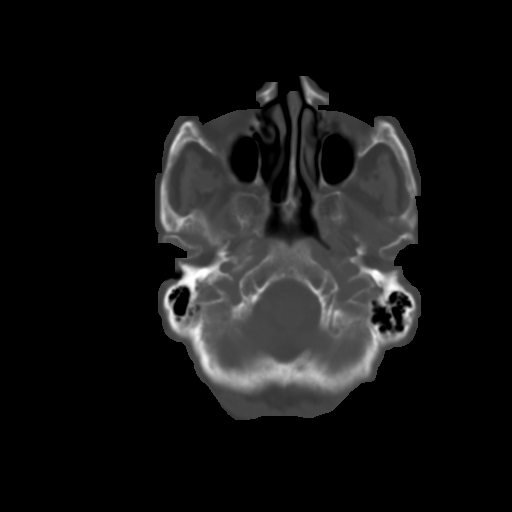
[im 6/29  brain]
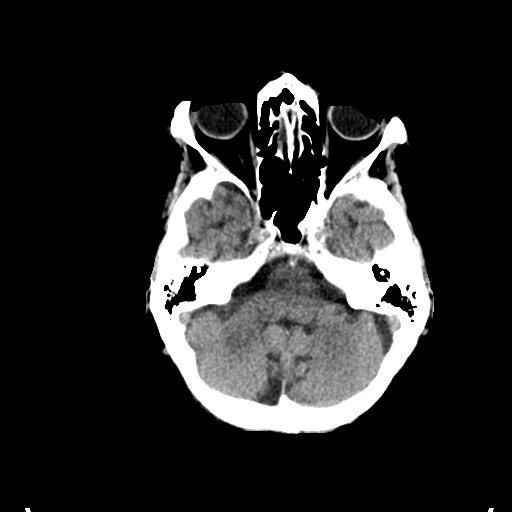
[im 9/29  brain]
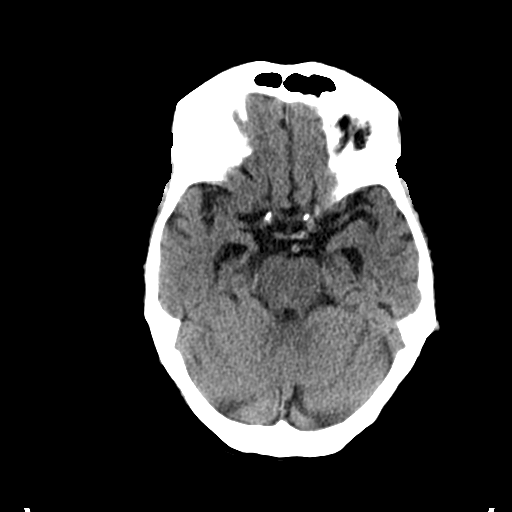
[im 12/29  brain]
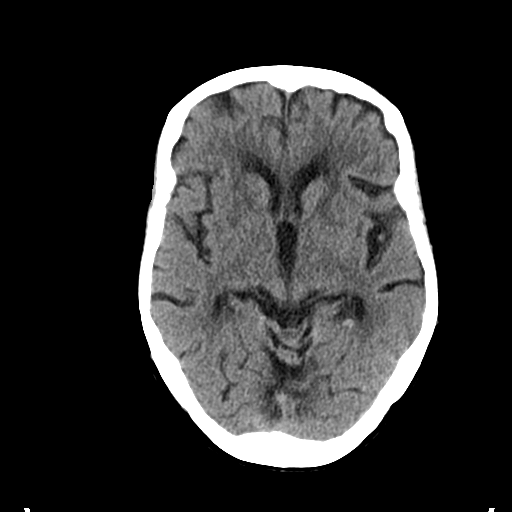
[im 15/29  brain]
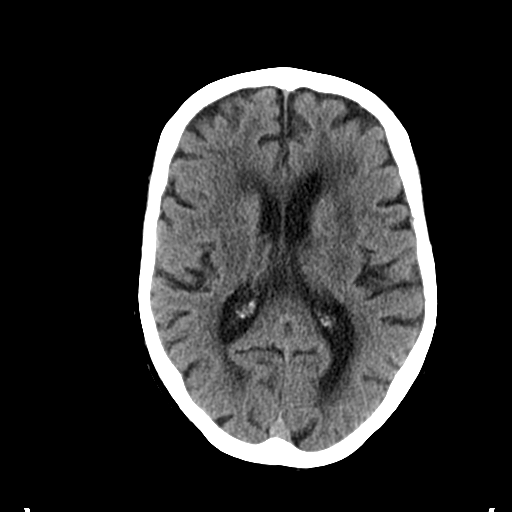
[im 15/29  bone]
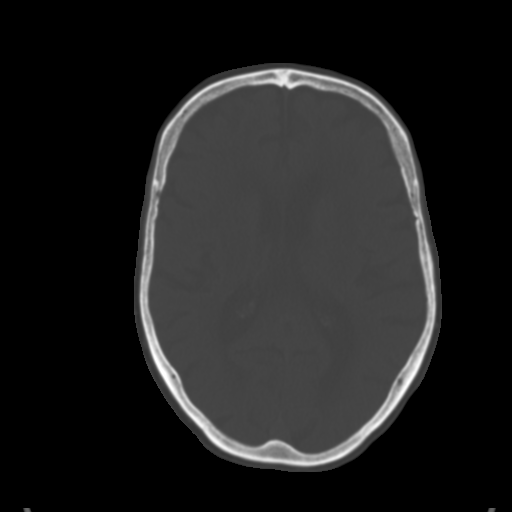
[im 18/29  brain]
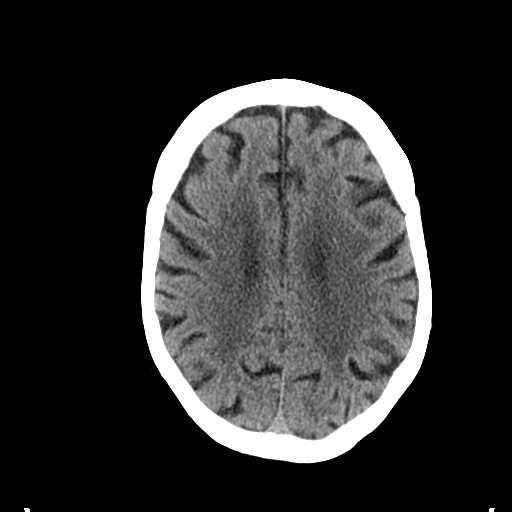
[im 21/29  brain]
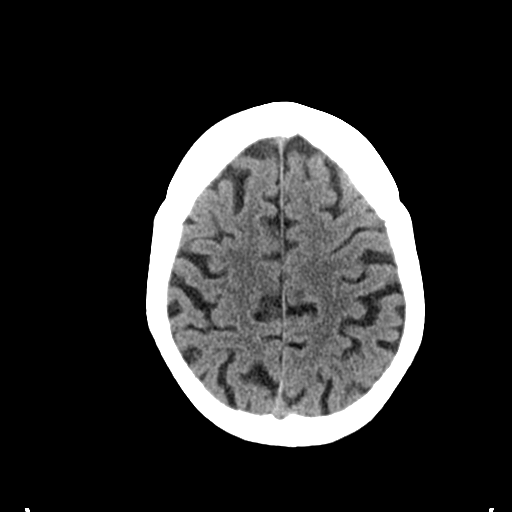
[im 24/29  brain]
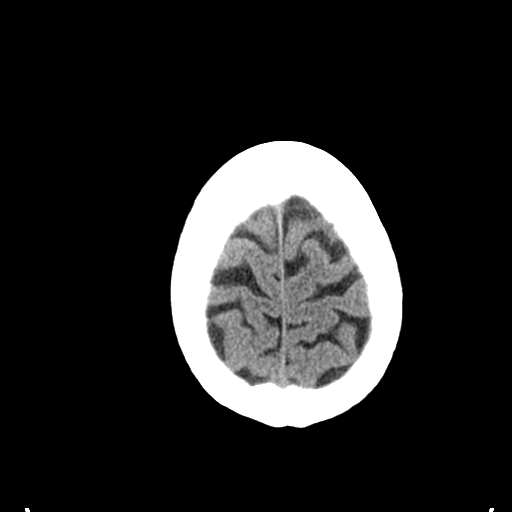
[im 27/29  brain]
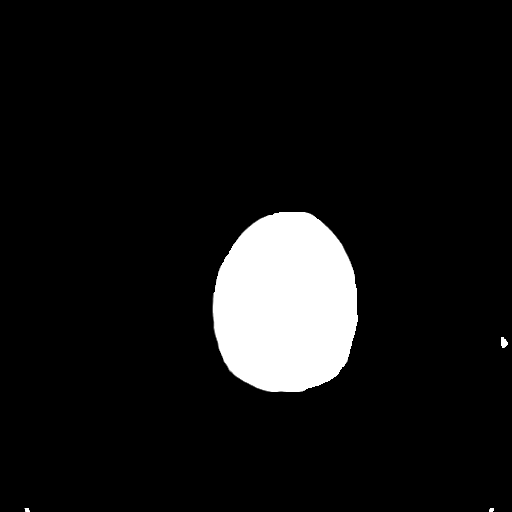
[im 27/29  bone]
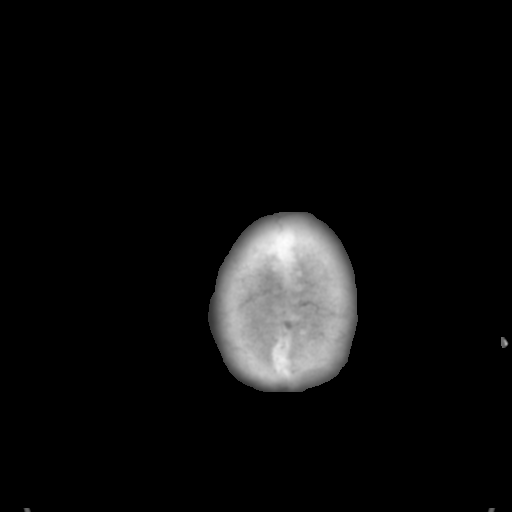

[Series 4: coronal soft tissue · coronal · 0.29mm/px · 3 of 68 slices shown]
[im 23/68  brain]
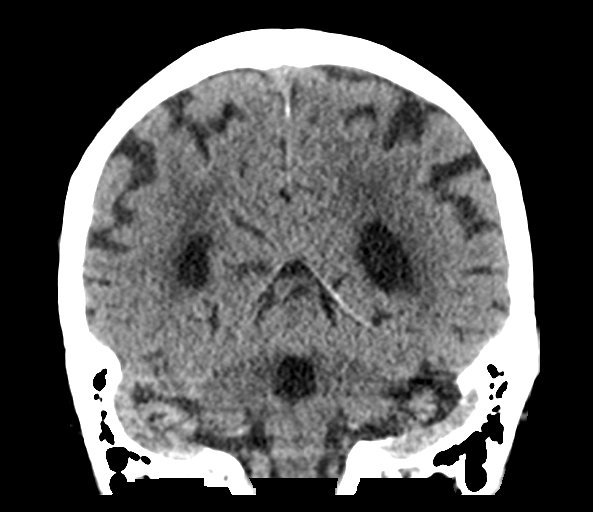
[im 30/68  brain]
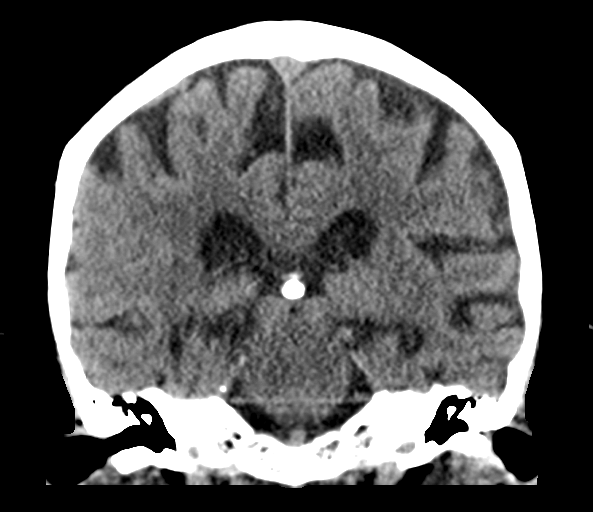
[im 38/68  brain]
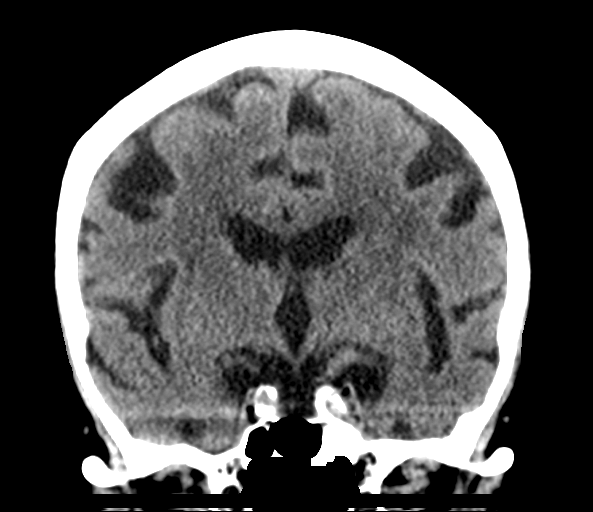

[Series 5: sagittal soft tissue · sagittal · 0.33mm/px · 3 of 52 slices shown]
[im 18/52  brain]
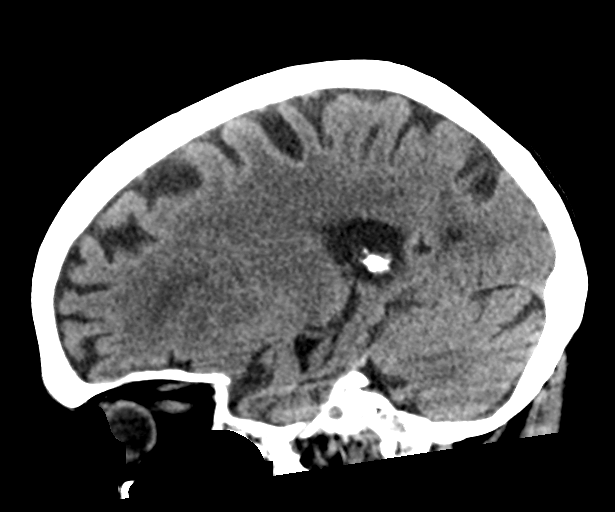
[im 26/52  brain]
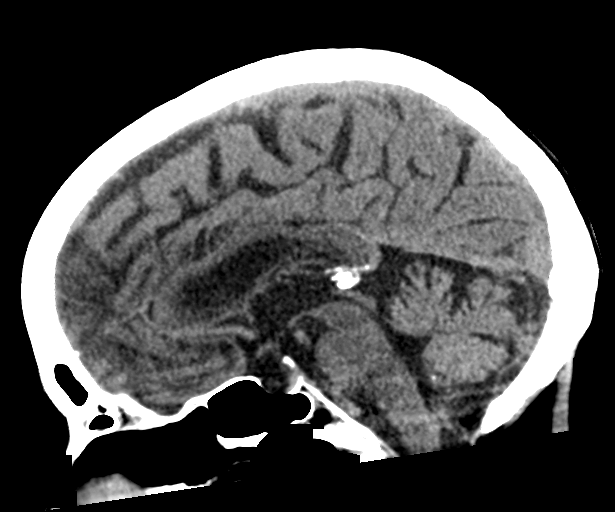
[im 35/52  brain]
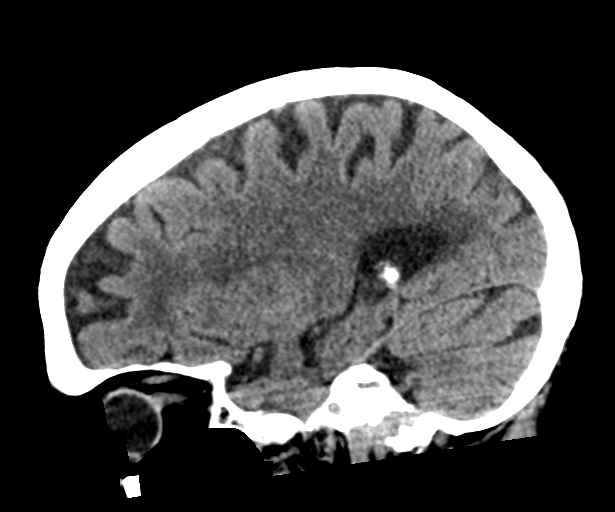

[15 of 46 positions shown; findings below may reference images not displayed]

FINDINGS: Brain: No evidence of parenchymal hemorrhage or extra-axial fluid
collection. No mass lesion, mass effect, or midline shift. No CT
evidence of acute infarction. Nonspecific moderate subcortical and
periventricular white matter hypodensity, most in keeping with
chronic small vessel ischemic change. Cerebral volume is age
appropriate. No ventriculomegaly.

Vascular: No acute abnormality.

Skull: No evidence of calvarial fracture.

Sinuses/Orbits: No fluid levels. Mild mucoperiosteal thickening in
the bilateral ethmoidal air cells and left sphenoid sinus.

Other:  The mastoid air cells are unopacified.
IMPRESSION: 1. No evidence of acute intracranial abnormality. No evidence of
calvarial fracture.
2. Moderate chronic small vessel ischemic change.
3. Mild chronic appearing paranasal sinusitis.

## 2019-06-12 ENCOUNTER — Telehealth: Payer: Self-pay | Admitting: Internal Medicine

## 2019-06-12 NOTE — Telephone Encounter (Signed)
-----   Message from Salvatore Marvel sent at 06/12/2019  2:10 PM EDT ----- Regarding: deceased Back on 09/30/2018 patients daughter called to cancel AWV due to patient being deceased.
